# Patient Record
Sex: Female | Born: 1940
Health system: Southern US, Community
[De-identification: ages and names within clinical notes are randomized; demographics above are authoritative.]

## PROBLEM LIST (undated history)

## (undated) DIAGNOSIS — F329 Major depressive disorder, single episode, unspecified: Secondary | ICD-10-CM

## (undated) DIAGNOSIS — M545 Low back pain, unspecified: Secondary | ICD-10-CM

## (undated) DIAGNOSIS — F32A Depression, unspecified: Secondary | ICD-10-CM

## (undated) DIAGNOSIS — I219 Acute myocardial infarction, unspecified: Secondary | ICD-10-CM

## (undated) DIAGNOSIS — G5600 Carpal tunnel syndrome, unspecified upper limb: Secondary | ICD-10-CM

## (undated) DIAGNOSIS — E039 Hypothyroidism, unspecified: Secondary | ICD-10-CM

## (undated) DIAGNOSIS — I10 Essential (primary) hypertension: Secondary | ICD-10-CM

## (undated) DIAGNOSIS — F0390 Unspecified dementia without behavioral disturbance: Secondary | ICD-10-CM

## (undated) DIAGNOSIS — I209 Angina pectoris, unspecified: Secondary | ICD-10-CM

## (undated) DIAGNOSIS — Z789 Other specified health status: Secondary | ICD-10-CM

## (undated) DIAGNOSIS — C50912 Malignant neoplasm of unspecified site of left female breast: Secondary | ICD-10-CM

## (undated) DIAGNOSIS — T8859XA Other complications of anesthesia, initial encounter: Secondary | ICD-10-CM

## (undated) DIAGNOSIS — R112 Nausea with vomiting, unspecified: Secondary | ICD-10-CM

## (undated) DIAGNOSIS — M810 Age-related osteoporosis without current pathological fracture: Secondary | ICD-10-CM

## (undated) DIAGNOSIS — Z531 Procedure and treatment not carried out because of patient's decision for reasons of belief and group pressure: Secondary | ICD-10-CM

## (undated) DIAGNOSIS — M419 Scoliosis, unspecified: Secondary | ICD-10-CM

## (undated) DIAGNOSIS — IMO0001 Reserved for inherently not codable concepts without codable children: Secondary | ICD-10-CM

## (undated) DIAGNOSIS — Z9889 Other specified postprocedural states: Secondary | ICD-10-CM

## (undated) DIAGNOSIS — G43909 Migraine, unspecified, not intractable, without status migrainosus: Secondary | ICD-10-CM

## (undated) DIAGNOSIS — K5790 Diverticulosis of intestine, part unspecified, without perforation or abscess without bleeding: Secondary | ICD-10-CM

## (undated) DIAGNOSIS — M1990 Unspecified osteoarthritis, unspecified site: Secondary | ICD-10-CM

## (undated) DIAGNOSIS — G8929 Other chronic pain: Secondary | ICD-10-CM

## (undated) DIAGNOSIS — R410 Disorientation, unspecified: Secondary | ICD-10-CM

## (undated) DIAGNOSIS — K219 Gastro-esophageal reflux disease without esophagitis: Secondary | ICD-10-CM

## (undated) DIAGNOSIS — C439 Malignant melanoma of skin, unspecified: Secondary | ICD-10-CM

## (undated) DIAGNOSIS — T4145XA Adverse effect of unspecified anesthetic, initial encounter: Secondary | ICD-10-CM

## (undated) DIAGNOSIS — K589 Irritable bowel syndrome without diarrhea: Secondary | ICD-10-CM

## (undated) HISTORY — PX: BREAST SURGERY: SHX581

## (undated) HISTORY — PX: DOPPLER ECHOCARDIOGRAPHY: SHX263

## (undated) HISTORY — PX: CARPAL TUNNEL RELEASE: SHX101

## (undated) HISTORY — DX: Migraine, unspecified, not intractable, without status migrainosus: G43.909

## (undated) HISTORY — PX: MELANOMA EXCISION: SHX5266

## (undated) HISTORY — DX: Carpal tunnel syndrome, unspecified upper limb: G56.00

## (undated) HISTORY — PX: BREAST LUMPECTOMY: SHX2

## (undated) HISTORY — DX: Major depressive disorder, single episode, unspecified: F32.9

## (undated) HISTORY — PX: BREAST BIOPSY: SHX20

## (undated) HISTORY — DX: Age-related osteoporosis without current pathological fracture: M81.0

## (undated) HISTORY — PX: SKIN GRAFT: SHX250

## (undated) HISTORY — PX: CATARACT EXTRACTION W/ INTRAOCULAR LENS  IMPLANT, BILATERAL: SHX1307

## (undated) HISTORY — PX: CARDIAC CATHETERIZATION: SHX172

## (undated) HISTORY — DX: Unspecified dementia, unspecified severity, without behavioral disturbance, psychotic disturbance, mood disturbance, and anxiety: F03.90

## (undated) HISTORY — DX: Essential (primary) hypertension: I10

## (undated) HISTORY — DX: Depression, unspecified: F32.A

## (undated) HISTORY — DX: Gastro-esophageal reflux disease without esophagitis: K21.9

## (undated) HISTORY — PX: KELOID EXCISION: SHX1856

## (undated) HISTORY — PX: CARDIOVASCULAR STRESS TEST: SHX262

## (undated) HISTORY — PX: JOINT REPLACEMENT: SHX530

## (undated) HISTORY — DX: Disorientation, unspecified: R41.0

## (undated) HISTORY — PX: TUBAL LIGATION: SHX77

## (undated) HISTORY — PX: DILATION AND CURETTAGE OF UTERUS: SHX78

---

## 1993-11-20 HISTORY — PX: CARPAL TUNNEL RELEASE: SHX101

## 1998-05-31 ENCOUNTER — Ambulatory Visit (HOSPITAL_COMMUNITY): Admission: RE | Admit: 1998-05-31 | Discharge: 1998-05-31 | Payer: Self-pay | Admitting: Internal Medicine

## 1998-06-03 ENCOUNTER — Ambulatory Visit: Admission: RE | Admit: 1998-06-03 | Discharge: 1998-06-03 | Payer: Self-pay | Admitting: Internal Medicine

## 1998-06-17 ENCOUNTER — Emergency Department (HOSPITAL_COMMUNITY): Admission: EM | Admit: 1998-06-17 | Discharge: 1998-06-17 | Payer: Self-pay

## 1998-06-22 ENCOUNTER — Inpatient Hospital Stay (HOSPITAL_COMMUNITY): Admission: EM | Admit: 1998-06-22 | Discharge: 1998-06-24 | Payer: Self-pay | Admitting: *Deleted

## 1998-10-19 ENCOUNTER — Encounter: Payer: Self-pay | Admitting: Internal Medicine

## 1998-10-19 ENCOUNTER — Ambulatory Visit (HOSPITAL_COMMUNITY): Admission: RE | Admit: 1998-10-19 | Discharge: 1998-10-19 | Payer: Self-pay | Admitting: Internal Medicine

## 1999-08-16 ENCOUNTER — Other Ambulatory Visit: Admission: RE | Admit: 1999-08-16 | Discharge: 1999-08-16 | Payer: Self-pay | Admitting: Internal Medicine

## 1999-12-08 ENCOUNTER — Encounter: Payer: Self-pay | Admitting: Internal Medicine

## 1999-12-09 ENCOUNTER — Ambulatory Visit (HOSPITAL_COMMUNITY): Admission: RE | Admit: 1999-12-09 | Discharge: 1999-12-09 | Payer: Self-pay | Admitting: Internal Medicine

## 2000-12-04 ENCOUNTER — Other Ambulatory Visit: Admission: RE | Admit: 2000-12-04 | Discharge: 2000-12-04 | Payer: Self-pay | Admitting: Internal Medicine

## 2000-12-25 ENCOUNTER — Encounter: Payer: Self-pay | Admitting: Internal Medicine

## 2000-12-25 ENCOUNTER — Ambulatory Visit (HOSPITAL_COMMUNITY): Admission: RE | Admit: 2000-12-25 | Discharge: 2000-12-25 | Payer: Self-pay | Admitting: Internal Medicine

## 2001-03-21 ENCOUNTER — Encounter: Admission: RE | Admit: 2001-03-21 | Discharge: 2001-03-21 | Payer: Self-pay | Admitting: Internal Medicine

## 2001-03-21 ENCOUNTER — Encounter: Payer: Self-pay | Admitting: Internal Medicine

## 2001-04-02 ENCOUNTER — Emergency Department (HOSPITAL_COMMUNITY): Admission: EM | Admit: 2001-04-02 | Discharge: 2001-04-03 | Payer: Self-pay | Admitting: *Deleted

## 2001-06-24 ENCOUNTER — Ambulatory Visit (HOSPITAL_BASED_OUTPATIENT_CLINIC_OR_DEPARTMENT_OTHER): Admission: RE | Admit: 2001-06-24 | Discharge: 2001-06-24 | Payer: Self-pay | Admitting: General Surgery

## 2002-02-13 ENCOUNTER — Encounter: Payer: Self-pay | Admitting: Internal Medicine

## 2002-02-13 ENCOUNTER — Ambulatory Visit (HOSPITAL_COMMUNITY): Admission: RE | Admit: 2002-02-13 | Discharge: 2002-02-13 | Payer: Self-pay | Admitting: Internal Medicine

## 2002-11-11 ENCOUNTER — Ambulatory Visit (HOSPITAL_COMMUNITY): Admission: RE | Admit: 2002-11-11 | Discharge: 2002-11-11 | Payer: Self-pay | Admitting: Gastroenterology

## 2002-11-11 ENCOUNTER — Encounter (INDEPENDENT_AMBULATORY_CARE_PROVIDER_SITE_OTHER): Payer: Self-pay | Admitting: Specialist

## 2003-03-11 ENCOUNTER — Ambulatory Visit (HOSPITAL_COMMUNITY): Admission: RE | Admit: 2003-03-11 | Discharge: 2003-03-11 | Payer: Self-pay | Admitting: Internal Medicine

## 2003-03-11 ENCOUNTER — Encounter: Payer: Self-pay | Admitting: Internal Medicine

## 2003-08-11 ENCOUNTER — Encounter: Payer: Self-pay | Admitting: Internal Medicine

## 2003-08-11 ENCOUNTER — Encounter: Admission: RE | Admit: 2003-08-11 | Discharge: 2003-08-11 | Payer: Self-pay | Admitting: Internal Medicine

## 2003-12-21 ENCOUNTER — Ambulatory Visit (HOSPITAL_COMMUNITY): Admission: RE | Admit: 2003-12-21 | Discharge: 2003-12-21 | Payer: Self-pay | Admitting: Internal Medicine

## 2004-07-26 ENCOUNTER — Ambulatory Visit (HOSPITAL_COMMUNITY): Admission: RE | Admit: 2004-07-26 | Discharge: 2004-07-26 | Payer: Self-pay | Admitting: Internal Medicine

## 2004-08-16 ENCOUNTER — Other Ambulatory Visit: Admission: RE | Admit: 2004-08-16 | Discharge: 2004-08-16 | Payer: Self-pay | Admitting: Internal Medicine

## 2004-11-24 ENCOUNTER — Emergency Department (HOSPITAL_COMMUNITY): Admission: EM | Admit: 2004-11-24 | Discharge: 2004-11-24 | Payer: Self-pay | Admitting: Family Medicine

## 2005-08-04 ENCOUNTER — Emergency Department (HOSPITAL_COMMUNITY): Admission: EM | Admit: 2005-08-04 | Discharge: 2005-08-04 | Payer: Self-pay | Admitting: Family Medicine

## 2005-08-18 ENCOUNTER — Encounter: Admission: RE | Admit: 2005-08-18 | Discharge: 2005-08-18 | Payer: Self-pay | Admitting: Occupational Medicine

## 2005-09-07 ENCOUNTER — Encounter: Admission: RE | Admit: 2005-09-07 | Discharge: 2005-10-19 | Payer: Self-pay | Admitting: Occupational Medicine

## 2005-10-10 ENCOUNTER — Ambulatory Visit (HOSPITAL_COMMUNITY): Admission: RE | Admit: 2005-10-10 | Discharge: 2005-10-10 | Payer: Self-pay | Admitting: Internal Medicine

## 2006-01-11 ENCOUNTER — Emergency Department (HOSPITAL_COMMUNITY): Admission: EM | Admit: 2006-01-11 | Discharge: 2006-01-11 | Payer: Self-pay | Admitting: Emergency Medicine

## 2006-04-08 ENCOUNTER — Encounter: Admission: RE | Admit: 2006-04-08 | Discharge: 2006-04-08 | Payer: Self-pay | Admitting: Internal Medicine

## 2006-06-05 ENCOUNTER — Encounter: Admission: RE | Admit: 2006-06-05 | Discharge: 2006-06-05 | Payer: Self-pay | Admitting: Specialist

## 2006-09-02 ENCOUNTER — Emergency Department (HOSPITAL_COMMUNITY): Admission: AD | Admit: 2006-09-02 | Discharge: 2006-09-02 | Payer: Self-pay | Admitting: Family Medicine

## 2006-09-02 ENCOUNTER — Ambulatory Visit (HOSPITAL_COMMUNITY): Admission: EM | Admit: 2006-09-02 | Discharge: 2006-09-02 | Payer: Self-pay | Admitting: Family Medicine

## 2006-11-15 ENCOUNTER — Ambulatory Visit (HOSPITAL_COMMUNITY): Admission: RE | Admit: 2006-11-15 | Discharge: 2006-11-15 | Payer: Self-pay | Admitting: Internal Medicine

## 2007-06-07 ENCOUNTER — Emergency Department (HOSPITAL_COMMUNITY): Admission: EM | Admit: 2007-06-07 | Discharge: 2007-06-07 | Payer: Self-pay | Admitting: Emergency Medicine

## 2007-06-11 ENCOUNTER — Encounter: Admission: RE | Admit: 2007-06-11 | Discharge: 2007-06-11 | Payer: Self-pay

## 2007-06-17 ENCOUNTER — Encounter: Admission: RE | Admit: 2007-06-17 | Discharge: 2007-06-17 | Payer: Self-pay | Admitting: Orthopedic Surgery

## 2007-06-21 HISTORY — PX: KNEE ARTHROSCOPY: SHX127

## 2007-06-26 ENCOUNTER — Ambulatory Visit: Payer: Self-pay | Admitting: Vascular Surgery

## 2007-06-26 ENCOUNTER — Ambulatory Visit (HOSPITAL_COMMUNITY): Admission: RE | Admit: 2007-06-26 | Discharge: 2007-06-26 | Payer: Self-pay | Admitting: Orthopedic Surgery

## 2007-06-26 ENCOUNTER — Encounter (INDEPENDENT_AMBULATORY_CARE_PROVIDER_SITE_OTHER): Payer: Self-pay | Admitting: Orthopedic Surgery

## 2007-07-11 ENCOUNTER — Ambulatory Visit (HOSPITAL_BASED_OUTPATIENT_CLINIC_OR_DEPARTMENT_OTHER): Admission: RE | Admit: 2007-07-11 | Discharge: 2007-07-11 | Payer: Self-pay | Admitting: Orthopedic Surgery

## 2007-10-21 HISTORY — PX: ANKLE SURGERY: SHX546

## 2007-11-05 ENCOUNTER — Other Ambulatory Visit: Payer: Self-pay | Admitting: Orthopedic Surgery

## 2007-11-12 ENCOUNTER — Ambulatory Visit (HOSPITAL_BASED_OUTPATIENT_CLINIC_OR_DEPARTMENT_OTHER): Admission: RE | Admit: 2007-11-12 | Discharge: 2007-11-12 | Payer: Self-pay | Admitting: Orthopedic Surgery

## 2008-03-16 ENCOUNTER — Ambulatory Visit (HOSPITAL_COMMUNITY): Admission: RE | Admit: 2008-03-16 | Discharge: 2008-03-16 | Payer: Self-pay | Admitting: Internal Medicine

## 2008-08-20 HISTORY — PX: TOTAL KNEE ARTHROPLASTY: SHX125

## 2008-08-25 ENCOUNTER — Inpatient Hospital Stay (HOSPITAL_COMMUNITY): Admission: RE | Admit: 2008-08-25 | Discharge: 2008-08-29 | Payer: Self-pay | Admitting: Orthopedic Surgery

## 2008-09-15 ENCOUNTER — Other Ambulatory Visit: Admission: RE | Admit: 2008-09-15 | Discharge: 2008-09-15 | Payer: Self-pay | Admitting: Internal Medicine

## 2008-11-04 ENCOUNTER — Encounter: Admission: RE | Admit: 2008-11-04 | Discharge: 2008-11-04 | Payer: Self-pay | Admitting: Internal Medicine

## 2009-02-01 ENCOUNTER — Emergency Department (HOSPITAL_COMMUNITY): Admission: EM | Admit: 2009-02-01 | Discharge: 2009-02-01 | Payer: Self-pay | Admitting: Emergency Medicine

## 2009-10-29 ENCOUNTER — Ambulatory Visit (HOSPITAL_COMMUNITY): Admission: RE | Admit: 2009-10-29 | Discharge: 2009-10-29 | Payer: Self-pay | Admitting: Internal Medicine

## 2010-04-12 ENCOUNTER — Emergency Department (HOSPITAL_COMMUNITY): Admission: EM | Admit: 2010-04-12 | Discharge: 2010-04-12 | Payer: Self-pay | Admitting: Emergency Medicine

## 2010-07-30 ENCOUNTER — Emergency Department (HOSPITAL_COMMUNITY)
Admission: EM | Admit: 2010-07-30 | Discharge: 2010-07-30 | Payer: Self-pay | Source: Home / Self Care | Admitting: Emergency Medicine

## 2010-11-17 ENCOUNTER — Ambulatory Visit (HOSPITAL_COMMUNITY)
Admission: RE | Admit: 2010-11-17 | Discharge: 2010-11-17 | Payer: Self-pay | Source: Home / Self Care | Attending: Internal Medicine | Admitting: Internal Medicine

## 2010-12-11 ENCOUNTER — Encounter: Payer: Self-pay | Admitting: Internal Medicine

## 2010-12-20 ENCOUNTER — Other Ambulatory Visit: Payer: Self-pay | Admitting: Dermatology

## 2011-04-04 NOTE — Op Note (Signed)
NAMEASMAA, TIRPAK                ACCOUNT NO.:  [id number]   MEDICAL RECORD NO.:  [id number]          PATIENT TYPE:  INP   LOCATION:  1605                         FACILITY:  [facility]   PHYSICIAN:  Deidre Ala, M.D.    DATE OF BIRTH:  [birth year]   DATE OF PROCEDURE:  08/25/2008  DATE OF DISCHARGE:                               OPERATIVE REPORT   PREOPERATIVE DIAGNOSIS:  End-stage degenerative joint disease right  knee.   POSTOPERATIVE DIAGNOSIS:  End-stage degenerative joint disease right  knee.   PROCEDURE:  Right total knee arthroplasty using cemented DePuy  components, LCS type with rotating platform with MBT type revision stem.   SURGEON:  1. Charlesetta Shanks, M.D.   ASSISTANT:  Phineas Semen, PA-C.   ANESTHESIA:  Spinal.   CULTURES:  None.   DRAINS:  Two medium Hemovacs to self suction.   ESTIMATED BLOOD LOSS:  Less than 100 mL.   REPLACED:  Without.   TOURNIQUET TIME:  68 minutes.   PATHOLOGIC FINDINGS AND HISTORY:  Ashley Hanson has had right knee pain.  She had a right knee arthroscopy which showed significant degenerative  joint disease.  She failed other conservative measures with persistent  pain.  Supartz did not resolve her symptoms, and so elected to proceed  with total knee arthroplasty.  At surgery, she had marked  tricompartmental disease, bone-on-bone.  We fitted her to a standard  plus right femur, a size standard rotating platform 12.5 mm, a #4 tibia,  a stem 75 x 14 and a 35 mm all-poly patellar button.  We used tobramycin  in the cement, two batches with two doses of tobramycin.  We had full  extension with flexion to greater than 105 degrees.  She had good  ligamentous stability with matched flexion-extension gap at 12.5.  We  did use gentamicin at the end of the case into the wound, injected over  the implants with Hemovac drains to self suction.   DESCRIPTION OF PROCEDURE:  With adequate anesthesia obtained using  spinal technique, 1 gram of Ancef  given IV prophylaxis and another one  at tourniquet letdown, the patient was placed in the supine position.  The right lower extremity was prepped from the toes to the tourniquet in  the standard fashion.  After standard prepping and draping, Esmarch  exsanguination was used.  The tourniquet was let up to 350 mmHg.  Median  parapatellar skin incisions followed by a median parapatellar  retinacular incision.  The incision was deepened sharply with a knife  and hemostasis obtained using the Bovie electrocoagulator.  The  retinaculum was incised, the patella everted.  Fat pad, both menisci and  the cruciates then removed.  I then amputated the tibial spine with a  saw.  I used the intermedullary reaming device and ultimately broached  up to a 14.  Intramedullary guide was put in placed on the tibia, the  cutting block put in place and the tibial cut made.  Alignment was  checked and was found to be satisfactory.  I then sized the femur to a  standard plus  and placed intermedullary guide down the canal.  A C-clamp  was used set on 20.  I then pinned it, then moved it down to 2.5 mm more  posterior.  I then set the C-clamp on a 17.5, made the anterior-  posterior cuts and then fit the 12.5 lollipop in flexion.  A 4 degree  distal valgus femoral cutting jig was then put in place.  The first cut  made and fit to 12.5 in extension.  Finishing guide was placed on the  femur and those cuts were made, as well as the notch cut.  I then sized  the tibia to a 4, centered it.  I pinned it and drilled a proximal  broach and drilled down for the stem.  I then trialed the tibia as  above, placed a 12.5 rotating platform and placed the femur on with good  articulation noted.  I then used the cutting guide for the patella,  cutting it down about 10 mm and placed the 3-peg hole guide, made those  holes and trialed the patella.  All trial components were then removed  while we checked component sizing as they  came on the field.  I then  articulated the knee through a range of motion as we said, removed all  the trial components as the new components came on the field for  implantation and checked for sizing.  Thorough jet lavage was carried  about the knee.  The cement was mixed in the cement gun with  antibiotics.  I then cemented after assembling the tibial component the  tibial tray and stem and impacted it, removed excess cement.  I then put  the 12.5 rotating platform.  I then cemented on the femoral component,  impacted it and removed excess cement.  I then held the knee in full  extension, impacted it, removed excess cement and cemented on the  patella component, impacted it and held it with a clamp until the cement  had cured.  When the cement had cured, the tourniquet was let down and  bleeding points cauterized.  Hemovac plans were drains were placed in  the medial and lateral gutter and brought up the superolateral portal.  The wound was then closed in layers with #1 Vicryl figure-of-eight  interrupted on the retinaculum, running locking oversew of #1 PDS, 0 and  2-0 Vicryl on the subcu and skin staples.  Hemovac was charged with 20  mg of gentamicin in 40 mL of saline and the rubber shod clamp used on  the Hemovac tubes, connected to the Hemovac, which will be opened up at  2 hours in the PACU.  A bulky sterile compressive dressing was applied  with Ace and knee immobilizer.  The patient then having tolerated the  procedure well was awakened and taken to the recovery room in  satisfactory condition to be admitted for routine postoperative care and  CPM.           ______________________________  V. Charlesetta Shanks, M.D.     VEP/MEDQ  D:  08/25/2008  T:  08/25/2008  Job:  [id number]

## 2011-04-04 NOTE — Op Note (Signed)
Ashley Hanson, Ashley Hanson                ACCOUNT NO.:  [id number]   MEDICAL RECORD NO.:  [id number]          PATIENT TYPE:  AMB   LOCATION:  DSC                          FACILITY:  [facility]   PHYSICIAN:  Deidre Ala, M.D.    DATE OF BIRTH:  [birth year]   DATE OF PROCEDURE:  11/12/2007  DATE OF DISCHARGE:                               OPERATIVE REPORT   PREOPERATIVE DIAGNOSES:  Impingement syndrome, left ankle with chronic  synovitis.  Rule out instability with chronic ankle sprain ATF.   POSTOPERATIVE DIAGNOSES:  1. Impingement syndrome right ankle, with synovitis anterior and      posterior.  2. No evidence of significant instability.   PROCEDURES:  1. Left ankle operative arthroscopy, with extensive debridement and      synovectomy; and debridement of the anterior distal tibia bony lip.  2. Exam under anesthesia with fluoroscopy.   SURGEON:  1. Charlesetta Shanks, M.D.   ASSISTANT:  Phineas Semen, P.A.   ANESTHESIA:  General with LMA.   CULTURES:  None.   DRAINS:  None.   ESTIMATED BLOOD LOSS:  Minimal.   TOURNIQUET TIME:  50 minutes.   PATHOLOGIC FINDINGS AND HISTORY:  Ashley Hanson has had a longstanding history  of left ankle pain and was worker's comp with this.  She has had an  extensive workup with MRIs.  Seeing Dr. Lestine Box, Dr. Lajoyce Corners and myself.  The ankle was continually becoming inflamed, painful, tender over the  anterior talofibular ligament, with some entertainment of possible ankle  instability noted.  Intermittent swelling.  Venous stasis changes and  persistent synovitis.  We have exhausted conservative analysis and  conservative treatment, and there is some question about whether or not  she is actually looser than showing up on stress views.  The problem is  doing a major reconstruction operation with her ankle swelling and skin  is worrisome.  In any case, Dr. Lajoyce Corners has recently said that there may be  unrecognized OA in the joint, th persistent synovitis.  Dr. Lajoyce Corners felt  that her exam was consistent with impingement, and suggested going ahead  with the scope.  The patient was desirous of proceeding, and at surgery  we did an inversion stress testing; there was indeed no significant  opening under anesthesia.  At surgery her articular surface looked quite  good.  She had a bit of impingement impaction in the anterior talar dome  and neck, but there was not significant ankle arthritis and she was not  opening to stressing under direct vision.  There was synovitis  posteriorly.  Anteriorly there was a large amount of synovitis and  synovitis in the medial and lateral gutters as well.  There was an  anterior distal tibial lip that appeared to be impinging in  dorsiflexion.  The anterior distal lip was debrided to a beveled edge,  as one would do an acromion in the shoulder.  All the synovitis was  removed from the medial lateral gutters and anteriorly to the talar  neck, as well as posteriorly.  Everything was clear at closure.   PROCEDURE:  With adequate anesthesia obtained using LMA technique, 1  gram Ancef given IV prophylaxis and the patient did have a regional  block.  The patient was placed in the supine position.  The left lower  extremity was prepped from the fingertips to the leg holder in the  standard fashion.  After standard prepping and draping, Esmarch  examination was used.  The tourniquet was let up to 350 mmHg.  We then  injected the joint through an anterior portal with an 18-gauge spinal  needle with 10 mL of saline to break the vacuum.  We then placed a  distractor mechanism around the ankle.  I then made an anterolateral  portal at the anterior mortise.  I made an incision spread with a  mosquito hemostat, to move any neural structures away; and then entered  the ankle from the anterolateral side with a scope and visualized the  joint.  There was a cloud of synovitis anteriorly.  A portal was made  anteromedially, a shaver brought in, and  all this was shaved out  including the medial gutter.  I then started with a small bur and  doubled the anterior distal tibia; and cleared it so it would not pinch  on dorsiflexion.  I cleared all the synovitis to the talar neck.  Portals were reversed and similar shavings carried out.  When I was  happy with the debridement and the bleeding points were cauterized, the  ankle was irrigated through the scope.  Marcaine 0.5% was not added due  to the block.  The portals were closed with subcuticular 4-0 Vicryl, and  then vertical mattress sutures of 4-0 nylon.  A bulky sterile  compressive dressing was applied with Ace.  The patient, then having  tolerated the procedure, was awakened, taken to recovery room in  satisfactory condition.  To be discharged per outpatient routine; given  Percocet for pain, crutches, weightbearing as tolerated.  Ice about the  ankle and told to call the office for appointment for recheck on Monday.   LABORATORY DATA:  Within normal limits .           ______________________________  V. Charlesetta Shanks, M.D.     VEP/MEDQ  D:  11/12/2007  T:  11/12/2007  Job:  [id number]

## 2011-04-07 NOTE — Op Note (Signed)
   NAME:  Ashley Hanson, Ashley Hanson                          ACCOUNT NO.:  [id number]   MEDICAL RECORD NO.:  [id number]                   PATIENT TYPE:  AMB   LOCATION:  ENDO                                 FACILITY:  [facility]   PHYSICIAN:  John C. Madilyn Fireman, M.D.                 DATE OF BIRTH:  14-May-1941   DATE OF PROCEDURE:  11/11/2002  DATE OF DISCHARGE:                                 OPERATIVE REPORT   PROCEDURE:  Colonoscopy.   INDICATION FOR PROCEDURE:  Family history of colon cancer.  This has also  done as part of a workup for diarrhea and weight loss.   DESCRIPTION OF PROCEDURE:  The patient was placed in the left lateral  decubitus position and placed on the pulse monitor with continuous low-flow  oxygen delivered by nasal cannula.  She was sedated with 25 mcg IV fentanyl  and 2 mg IV Versed in addition to the medicine given for the previous EGD.  The Olympus video colonoscope was inserted into the rectum and advanced to  the cecum, confirmed by transillumination of McBurney's point and  visualization of the ileocecal valve and appendiceal orifice.  The prep was  excellent.  In the base of the cecum there was seen an 8 mm round polyp,  which was removed by snare.  The remainder of the cecum, ascending,  transverse, descending, sigmoid, and rectum all appeared normal with no  masses, polyps, diverticula, or other mucosal abnormalities.  The rectum  likewise appeared normal, and retroflexed view of the anus revealed no  obvious internal hemorrhoids.  Biopsies were taken to rule out collagenous  colitis.  The scope was then withdrawn and the patient returned to the  recovery room in stable condition.  She tolerated the procedure well, and  there were no immediate complications.   IMPRESSION:  Cecal polyp, otherwise normal study.   PLAN:  Await all biopsy results.                                               John C. Madilyn Fireman, M.D.    JCH/MEDQ  D:  11/11/2002  T:  11/12/2002  Job:   [id number]   cc:   Erskine Speed, M.D.  [address], [address]  [place]  [place] [address]  Fax: [phone]

## 2011-04-07 NOTE — Op Note (Signed)
   NAME:  Ashley Hanson, Ashley Hanson                          ACCOUNT NO.:  [id number]   MEDICAL RECORD NO.:  [id number]                   PATIENT TYPE:  AMB   LOCATION:  ENDO                                 FACILITY:  [facility]   PHYSICIAN:  John C. Madilyn Fireman, M.D.                 DATE OF BIRTH:  Jan 11, 1941   DATE OF PROCEDURE:  DATE OF DISCHARGE:                                 OPERATIVE REPORT   PROCEDURE:  Esophagogastroduodenoscopy.   INDICATIONS FOR PROCEDURE:  Diarrhea, anorexia, weight loss and family  history of colon cancer. She is also undergoing colonoscopy today.   DESCRIPTION OF PROCEDURE:  The patient was placed in the left lateral  decubitus position and placed on the pulse monitor with continuous low flow  oxygen delivered by nasal cannula. She was sedated with 50 mcg of IV  fentanyl and 4 mg of IV Versed.   The Olympus video endoscope was advanced under direct visualization into the  oropharynx and esophagus. The esophagus was straight and of normal caliber  with the squamocolumnar line at 38 cm. There was no significant hiatal  hernia, ring or stricture that I could note at the GE junction.   The stomach was entered and a small amount of liquid secretions were  suctioned from the fundus. A retroflex view of the cardia was unremarkable.  The fundus, body, antrum and pylorus all appeared normal. The duodenum was  entered and both the bulb and the second portion were all inspected and  appeared to be within normal limits. The scope was advanced as far as  possible down the distal duodenum and biopsies were taken to rule out celiac  disease.   The scope was then withdrawn.  The patient returned to the recovery room in  stable condition. She tolerated the procedure well and there were no  immediate complications.   IMPRESSION:  Normal endoscopy.   PLAN:  Await biopsies and will proceed to the colonoscopy and further  evaluation of her diarrhea.                John C. Madilyn Fireman, M.D.    JCH/MEDQ  D:  11/11/2002  T:  11/12/2002  Job:  [id number]   cc:   Erskine Speed, M.D.  [address] [place]  [place]  [place] [address]  Fax: [phone]

## 2011-04-07 NOTE — Discharge Summary (Signed)
NAMEAUSTINA, Ashley Hanson                ACCOUNT NO.:  [id number]   MEDICAL RECORD NO.:  [id number]          PATIENT TYPE:  INP   LOCATION:  1605                         [facility]ACILITY:  [facility]   PHYSICIAN:  Deidre Ala, M.D.    DATE OF BIRTH:  1941/05/04   DATE OF ADMISSION:  08/25/2008  DATE OF DISCHARGE:  08/29/2008                               DISCHARGE SUMMARY   FINAL DIAGNOSES:  1. Degenerative disk disease of right knee.  2. Hypertension.  3. Anxiety.  4. Gastroesophageal reflux disease.  5. Postoperative blood loss anemia.  6. Hypokalemia.  7. History of breast cancer.   PROCEDURES:  On August 25, 2008:  Right total knee arthroplasty.   SURGEON:  1. Charlesetta Shanks, M.D.   HISTORY:  This is a 70 year old African American female followed by Dr.  Renae Fickle for a number of years.  We have done previous surgeries on her.  She has had a complaint of right knee pain which failed medical  management.  She was ready for a total knee arthroplasty.  She  subsequently was scheduled.   HOSPITAL COURSE:  On August 25, 2008, she was admitted through [facility] where she underwent a total knee arthroplasty.  The  patient tolerated the procedure well.  No intraoperative complication  occurred.  Postoperatively for the most part, the patient did well  during her stay.  She did suffer some hypokalemia which resolved with  p.o. potassium during her stay.  She had postoperative blood loss anemia  which did not require blood products.  She did well throughout her stay  otherwise.  Worked with physical therapy and was up moving about by the  third postoperative day.  On her fourth postoperative day, she was ready  for discharge.  She had been on Lovenox and will continue this on this  as an outpatient for another 10 days.   DISCHARGE MEDICATIONS:  1. Neurontin 600 mg b.i.d.  2. Nexium 40 mg daily.  3. Propanol ER 120 mg daily.  4. Clorazepate 3.75 mg t.i.d.  5. Diltiazem 180 mg daily.  6.  Indapamide 2.5 mg daily.  7. Percocet 5/325 one-two p.o. q.4-6 h. p.r.n. for pain.  8. Skelaxin 500 mg 1 p.o. q.8 h. p.r.n. for muscle spasms.  9. Lovenox 30 mg subcutaneous q.12 h. for the next 10 days.   DISPOSITION:  The patient was subsequently discharged in satisfactory  and stable condition on August 29, 2008.   FOLLOW UP:  Follow up with Dr. Renae Fickle within 10 days.      Phineas Semen, Wynonia Hazard, M.D.  Electronically Signed    CL/MEDQ  D:  09/22/2008  T:  09/22/2008  Job:  [id number]

## 2011-04-07 NOTE — Op Note (Signed)
Ashley Hanson, Ashley Hanson                ACCOUNT NO.:  [id number]   MEDICAL RECORD NO.:  [id number]          PATIENT TYPE:  AMB   LOCATION:  NESC                         FACILITY:  [facility]   PHYSICIAN:  Deidre Ala, M.D.    DATE OF BIRTH:  [birth year]   DATE OF PROCEDURE:  07/12/2007  DATE OF DISCHARGE:                               OPERATIVE REPORT   PREOPERATIVE DIAGNOSIS:  1. Horizontal tear posterior horn lateral meniscus.  2. Degenerative change about the knee worse in the patellofemoral      compartment.  3. Baker's cyst with some debris.  4. Mild body edema in the tibial tuberosity, possible component of      marked patellofemoral degenerative disease.   POSTOPERATIVE DIAGNOSIS:  1. Degenerative lateral meniscus tear.  2. Posterior medial meniscus tear.  3. Grade 3 OA entire trochlea.  4. Grade 3 OA medial tibial plateau, lateral tibial plateau, medial      femoral condyle.  5. Tight lateral retinaculum.  6. Medial and lateral plicas  7. Pouch synovitis.   PROCEDURE:  1. Right knee operative arthroscopy with partial medial-and-lateral      meniscectomies.  2. Abrasion-ablation chondroplasty, trochlea, medial femoral condyle,      medial tibial plateau and lateral tibial plateau, and lateral      femoral condyle.  3. Lateral retinacular release.  4. Medial-and-lateral plica excision.   SURGEON:  1. Charlesetta Shanks, M.D.   ASSISTANT:  Phineas Semen, P.A.   ANESTHESIA:  General with LMA.   CULTURES:  None.   DRAINS:  None.   ESTIMATED BLOOD LOSS:  Minimal.   TOURNIQUET TIME:  42 minutes.   PATHOLOGIC FINDINGS AND HISTORY:  [name] is a 70 year old female who has  had bilateral hip pain and ankle pain, but also knee pain.  We saw her  ON January 18, 2007; and felt that she had some right knee degenerative  joint disease medial side and injected her knee with cortisone.  She  also had injections in her trochanteric bursa.  She has had chronic long-  term ankle problems  with some venous stasis especially on the left side.  She continued to have discomfort on her knee and bilateral leg pain.  We  got ABIs done and ultrasound which were all negative.  Ultimately we got  an MRI of her knee showing a posterior horn, medial meniscus tear, and  degeneration in the patellofemoral joint.  She was continuing to have  pain in her knee; and she elected to proceed with diagnostic and  operative arthroscopy.  She had failed her previous cortisone injection  with only temporary relief.  At surgery we found basically the entire  trochlea to be involved with grade 3 changes and this was the area that,  I think, she was most symptomatic.  This was smoothed with the ablator  all the way down to the notch.  She had a very tight lateral retinaculum  with degenerative change on the lateral patella.  She was not broken  down more medially, but she was extremely tight; and we did a lateral  retinacular release because of this significant patellofemoral disease.  I think this was probably her major area of symptomatology.   She had a degenerative posterior horn medial meniscus tear and a  degenerative mid segment lateral meniscus tear to mid substance of  lateral meniscus that was ragged and somewhat unstable.  Both menisci  were trimmed back to stable rims leaving as much meniscus as possible.  She had a lateral retinaculum so tight that we could hardly get the  scope underneath it.  We did that release; we shaved out medial lateral  plicas; smoothed all the chondromalacia; and grade 3 osteoarthritic  surfaces.  She had improved tilt and track and less pressurization at  closure.   DESCRIPTION OF PROCEDURE:  With adequate anesthesia obtained using LMA  technique, 1 gram Ancef given IV prophylaxis.  The patient was placed in  the supine position; and the right lower extremity was prepped from the  malleoli to the leg holder in the standard fashion.  After standard  prepping  and draping, Esmarch examination was used.  The tourniquet was  let up to 350 mmHg.  Superior and lateral inflow portal was made.  The  knee was insufflated with normal saline with an arthroscopic pump.  The  medial lateral scope portals were then made; and the joint was  thoroughly inspected.  I then shaved out the medial plica back to the  sidewall and lysed the medial band.  I then shaved the trochlear defect  which was extensive, half dollar size, and then smoothed it with the  ablator on 1 from the top of the trochlea all the way to the notch.  Edges were also smoothed with this to make a good gliding motion of the  patella.   I then checked the posterior medial meniscus.  I did see a small loose  body which we sucked out.  We used basket and shaver to smooth the  posterior horn medial meniscus and used the ablator on 1 to smooth.  I  then reversed portals; and I used a probe to the lateral meniscus; used  basket and shaver to saucerize this tear, and smoothed it with the  ablator on 1 to a stable rim.  I did light ablation on the lateral  tibial plateau and lateral femoral condyle.   I then shaved out the lateral gutter synovitis and pouch synovitis.  I  then smoothed the undersurface of the lateral patella where she was very  tight with a very tight lateral retinaculum, so tight I could not get  the scope underneath.  Lateral retinacular release was carried out from  vastus lateralis to the joint line with the hook ArthroCare.  We then  observed improved tilt and track.  The knee was then irrigated through  the scope with 0.5% Marcaine with morphine injected in about the joint.  The portal was left open.  A bulky sterile compressive dressing was  applied with lateral foam pad for tamponade; and an easy-wrap placed.  The patient then having tolerated procedure well was awakened, taken to  recovery room in satisfactory condition, to be discharged per outpatient  routine.  Given  Percocet for pain; and told to call the office for  recheck tomorrow.   Laboratory data within normal limits.           ______________________________  V. Charlesetta Shanks, M.D.    VEP/MEDQ  D:  07/11/2007  T:  07/12/2007  Job:  [id number]

## 2011-04-07 NOTE — Op Note (Signed)
[place]  Patient:    Ashley Hanson, Ashley Hanson                       MRN: [id number] Proc. Date: 06/24/01 Adm. Date:  [id number] Attending:  Brandy Hale CC:         Erskine Speed, M.D.   Operative Report  PREOPERATIVE DIAGNOSES: 1. A 1.5 cm recurrent keloid left axillary scar. 2. A 5.0 mm cyst right gluteal area. 3. A 6.0 mm diameter pigmented nevus left calf.  POSTOPERATIVE DIAGNOSES: 1. A 1.5 cm recurrent keloid left axillary scar. 2. A 5.0 mm cyst right gluteal area. 3. A 6.0 mm diameter pigmented nevus left calf.  OPERATION: 1. Excision 1.5 cm diameter recurrent keloid left axilla. 2. Excision 5.0 mm cyst right gluteal area. 3. Excision of 6.0 mm nevus left calf.  SURGEON:  Angelia Mould. Derrell Lolling, M.D.  OPERATIVE INDICATIONS:  This is a 70 year old black female with a history of breast cancer.  She had a keloid excised in her left axillary scar several years ago and did very well with that but now the most posterior aspect of this incision has developed recurrent tender keloid about 1.5 cm in diameter. She has a small pigmented cyst of the right gluteal area and a raised pigmented nevus of the left calf which bother her and she also wants these excised.  OPERATIVE TECHNIQUE:  The patient was brought to [facility] to the Minor Surgery Room.  We ultimately prepped and draped the left axilla, the right gluteal area and the left calf in routine sterile fashion.  Xylocaine 1% with epinephrine was used as a local infiltration anesthetic.  The left axillary wound was inspected.  A transverse elliptical incision was made excising the keloid scar which was sent for pathology.  I injected 2.5 cc of Kenalog solution into the dermis throughout the incision.  The skin was closed with five interrupted sutures of 4-0 nylon and sterile bandage was placed.  The right gluteal area was inspected.  Xylocaine 1% with epinephrine was  used as a local infiltration anesthetic. A transverse elliptical incision was made and we excised completely a very darkly pigmented cystic structure.  This was sent for histology.  The skin was closed with a single suture of 4-0 nylon and sterile bandage.  We then exposed the left calf area posteriorly.  Xylocaine 1% with epinephrine was used as a local anesthetic. A transverse elliptical incision was made around this pigmented nevus and great care was taken to get about a 2 mm margin around this.  The specimen was sent to pathology to rule out melanoma. Skin was closed with three interrupted sutures of 4-0 nylon.  A clean bandage was placed.  She tolerated all three of these procedures well.  There were no complications. Hemostasis was excellent. She was returned to the waiting room in excellent condition. DD:  06/24/01 TD:  06/25/01 Job: [id number]

## 2011-06-21 HISTORY — PX: SHOULDER ARTHROSCOPY: SHX128

## 2011-06-30 ENCOUNTER — Ambulatory Visit
Admission: RE | Admit: 2011-06-30 | Discharge: 2011-06-30 | Disposition: A | Discharge status: Home / Self Care | Payer: Medicare Other | Source: Ambulatory Visit | Attending: Cardiovascular Disease | Admitting: Cardiovascular Disease

## 2011-06-30 ENCOUNTER — Other Ambulatory Visit: Payer: Self-pay | Admitting: Cardiovascular Disease

## 2011-06-30 DIAGNOSIS — Z859 Personal history of malignant neoplasm, unspecified: Secondary | ICD-10-CM

## 2011-07-12 ENCOUNTER — Other Ambulatory Visit: Payer: Self-pay | Admitting: Orthopedic Surgery

## 2011-07-12 ENCOUNTER — Encounter (HOSPITAL_COMMUNITY): Payer: Medicare Other

## 2011-07-12 LAB — SURGICAL PCR SCREEN
MRSA, PCR: NEGATIVE
Staphylococcus aureus: NEGATIVE

## 2011-07-12 LAB — NO BLOOD PRODUCTS

## 2011-07-13 ENCOUNTER — Ambulatory Visit (HOSPITAL_COMMUNITY)
Admission: RE | Admit: 2011-07-13 | Discharge: 2011-07-14 | Disposition: A | Discharge status: Home / Self Care | Payer: Medicare Other | Source: Ambulatory Visit | Attending: Orthopedic Surgery | Admitting: Orthopedic Surgery

## 2011-07-13 DIAGNOSIS — I10 Essential (primary) hypertension: Secondary | ICD-10-CM | POA: Insufficient documentation

## 2011-07-13 DIAGNOSIS — M25519 Pain in unspecified shoulder: Secondary | ICD-10-CM | POA: Insufficient documentation

## 2011-07-13 DIAGNOSIS — M24119 Other articular cartilage disorders, unspecified shoulder: Secondary | ICD-10-CM | POA: Insufficient documentation

## 2011-07-13 DIAGNOSIS — M19019 Primary osteoarthritis, unspecified shoulder: Secondary | ICD-10-CM | POA: Insufficient documentation

## 2011-07-13 DIAGNOSIS — K219 Gastro-esophageal reflux disease without esophagitis: Secondary | ICD-10-CM | POA: Insufficient documentation

## 2011-07-13 DIAGNOSIS — Z79899 Other long term (current) drug therapy: Secondary | ICD-10-CM | POA: Insufficient documentation

## 2011-07-13 DIAGNOSIS — M6580 Other synovitis and tenosynovitis, unspecified site: Secondary | ICD-10-CM | POA: Insufficient documentation

## 2011-07-13 DIAGNOSIS — M25819 Other specified joint disorders, unspecified shoulder: Secondary | ICD-10-CM | POA: Insufficient documentation

## 2011-07-13 DIAGNOSIS — Z01812 Encounter for preprocedural laboratory examination: Secondary | ICD-10-CM | POA: Insufficient documentation

## 2011-07-21 NOTE — Discharge Summary (Signed)
  [name], Ashley Hanson                ACCOUNT NO.:  [id number]  MEDICAL RECORD NO.:  [id number]  LOCATION:  1601                         FACILITY:  [facility]  PHYSICIAN:  Jones Broom, MD    DATE OF BIRTH:  1941/05/11  DATE OF ADMISSION:  07/13/2011 DATE OF DISCHARGE:  07/14/2011                              DISCHARGE SUMMARY   FINAL DIAGNOSES:  Status post right shoulder arthroscopy with subacromial decompression, distal clavicle excision, and debridement of partial rotator cuff tear and labral tear.  HOSPITAL COURSE:  [name] was admitted postoperatively after undergoing the above procedure.  She tolerated the procedure in postoperative period very well.  She was kept overnight due to prior complications with general anesthesia for close observation.  She remained very stable overnight with stable vital signs.  She had a interscalene block for pain control, which wore off during the night. The pain was well controlled with IV and oral medications.  In the morning, she was neurovascularly intact and was discharged home in stable condition with her family.  DISCHARGE INSTRUCTIONS:  She will wear the sling for comfort.  She will take off her dressing postoperative day #2 and then apply bandage as needed.  She will begin some gentle range of motion exercises on her own and will use ice.  She will follow up with me in about 7 to 10 days for suture removal, at  which time will get on to physical therapy.  She will have Percocet for pain control.     Jones Broom, MD     JC/MEDQ  D:  07/14/2011  T:  07/14/2011  Job:  [id number]  Electronically Signed by Jones Broom  on 07/21/2011 09:39:43 AM

## 2011-07-21 NOTE — Op Note (Signed)
Ashley Hanson, Ashley Hanson                ACCOUNT NO.:  [id number]  MEDICAL RECORD NO.:  [id number]  LOCATION:  1601                         FACILITY:  [facility]  PHYSICIAN:  Jones Broom, MD    DATE OF BIRTH:  [birth year]  DATE OF PROCEDURE:  07/13/2011 DATE OF DISCHARGE:                              OPERATIVE REPORT   PREOPERATIVE DIAGNOSES: 1. Right shoulder acromioclavicular joint degenerative disease. 2. Right shoulder chronic impingement. 3. Right shoulder glenohumeral osteoarthritis.  POSTOPERATIVE DIAGNOSES: 1. Right shoulder acromioclavicular joint degenerative disease. 2. Right shoulder chronic impingement. 3. Right shoulder glenohumeral osteoarthritis. 4. Partial-thickness undersurface supraspinatus tearing and     degenerative tear of the superior labrum.  PROCEDURE PERFORMED: 1. Right shoulder arthroscopic distal clavicle excision. 2. Right shoulder arthroscopic subacromial decompression. 3. Arthroscopic debridement of partial-thickness undersurface rotator     cuff tear.  Glenohumeral chondromalacia and degenerative labral     tears.  ATTENDING SURGEON:  Berline Lopes, MD  ASSISTANT:  None.  ANESTHESIA:  General endotracheal anesthesia with preoperative interscalene block.  COMPLICATIONS:  None.  DRAINS:  None.  SPECIMENS:  None.  ESTIMATED BLOOD LOSS:  Minimal.  INDICATIONS FOR SURGERY:  The patient is a 70 year old female with a long history of right shoulder pain and problems.  She has had temporary relief with subacromial injections in the past and has been felt to have a component of arthritic pain but also with significant impingement symptoms and pain, with symptomatic AC joint degenerative disease as well.  She failed nonoperative management in the form of injections, therapy and medications, and wished to go forward with surgical treatment.  She understood the risks, benefits and alternatives of the procedure including, but not limited to,  risk of bleeding, infection, damage to neurovascular structures, and risks of anesthesia.  She elected to go forward with surgery.  OPERATIVE FINDINGS:  Examination under anesthesia demonstrated some stiffness consistent with her underlying osteoarthritis.  External rotation 90 degrees was about 75, internal rotation was to about 50.  No instability was noted.  Diagnostic arthroscopy revealed some extensive grade 3 and 4 changes of the articular surface of the glenoid as well as the humeral head.  This was debrided back to stable base.  There were a few small chondral fragments that were free floating which were also debrided.  The superior labrum was noted to be severely degenerated and torn and was debrided back to a stable base.  The biceps root was intact. Subscapularis was intact.  There was some diffuse synovitis in the joint which was debrided as well.  The undersurface of the supraspinatus insertion was extensively frayed but this was debrided back to the healthy-appearing tendon, which was completely intact otherwise.  The subacromial space was noted to have significant hypertrophied bursa and the bursal surface of the rotator cuff was completely intact.  The coracoacromial ligament was taken down to expose the large anterior acromial spur, which was taken down with standard acromioplasty, and distal clavicle excision was also performed.  The [facility] joint was noted to be very arthritic.  DESCRIPTION OF PROCEDURE:  The patient was identified in the preoperative holding area where I personally marked the operative site after  verifying site, side and procedure with the patient.  She was taken back to the operating room after an interscalene block was given by the attending anesthesiologist.  She had anesthesia then given in the operating room and was placed in a beach-chair position with all extremities carefully padded and positioned.  The right upper extremity was prepped and  draped in the standard sterile fashion.  She did receive IV antibiotics before the procedure.  The appropriate time-out procedure was carried out.  The arthroscope was introduced through a standard posterior portal and an anterior portal was established above the subscapularis with needle localization.  Diagnostic arthroscopy was then carried out with findings as described above.  The shaver was introduced through the anterior portal to debride the degenerative superior labrum down to a stable base.  The biceps root was intact.  There was extensive synovitis which was also debrided.  The chondral surfaces of the glenoid and humeral head were carefully examined and debrided of any unstable flaps and an attempt was made to try to smooth out the irregular surface due to the extensive arthritis in the joint.  The undersurface of the rotator cuff was carefully examined.  There was no extensive tearing but there was significant fraying on the undersurface which was debrided back to a stable base.  The underlying tendon was healthy-appearing. The axillary recess was examined and found to have a small chondral free- floating fragment which was removed.  The arthroscope was then introduced in the subacromial space where she was noted to have extensive hypertrophied inflamed bursa.  This was debrided with complete bursectomy arthroscopically.  The underlying bursa of the rotator cuff was completely intact.  The coracoacromial ligament was then taken down with the ArthroCare and the underlying very sharp, large, anterior acromial spur was identified.  This was taken down with a 4-mm bur from lateral to medial with a standard acromioplasty creating a smooth undersurface to the acromion.  The distal clavicle was then exposed arthroscopically and anterior portal was moved into the [facility] joint. Standard distal claviculectomy was performed taking approximately 8-mm of distal clavicle using the 4-mm bur from  anterior and lateral portals. The final resection was viewed from anterior, posterior and lateral portals and felt to be adequate.  The acromioplasty was viewed from lateral portal and found be completely smooth from posterior to anterior.  The shaver was then run into the joint to remove any bone dust and arthroscopic equipment was then removed.  The portals were closed with 3-0 nylon in interrupted fashion and the sterile dressings were applied including Adaptic, 4x4s, ABDs, tape.  The patient was placed in a sling, taken to the recovery room in stable condition.  POSTOPERATIVE PLAN:  She will be discharged home today with her family as long as she remains stable in the recovery room.  She will begin some gentle active motion as soon as she is stable, and will follow up with me in about 1 week.     Jones Broom, MD     JC/MEDQ  D:  07/13/2011  T:  07/13/2011  Job:  [id number]  Electronically Signed by Jones Broom  on 07/21/2011 09:39:46 AM

## 2011-08-21 LAB — CBC
HCT: 28.5 — ABNORMAL LOW
HCT: 32.5 — ABNORMAL LOW
HCT: 35.3 — ABNORMAL LOW
HCT: 37.1
Hemoglobin: 11.2 — ABNORMAL LOW
Hemoglobin: 11.7 — ABNORMAL LOW
Hemoglobin: 12.3
Hemoglobin: 9.9 — ABNORMAL LOW
MCHC: 33
MCHC: 33.2
MCHC: 34.3
MCHC: 34.8
MCV: 90.2
MCV: 91
MCV: 91.7
MCV: 92
Platelets: 144 — ABNORMAL LOW
Platelets: 144 — ABNORMAL LOW
Platelets: 155
Platelets: 197
RBC: 3.11 — ABNORMAL LOW
RBC: 3.61 — ABNORMAL LOW
RBC: 3.84 — ABNORMAL LOW
RBC: 4.08
RDW: 13.5
RDW: 13.6
RDW: 13.8
RDW: 13.9
WBC: 6
WBC: 7.6
WBC: 8
WBC: 9.9

## 2011-08-21 LAB — COMPREHENSIVE METABOLIC PANEL
ALT: 19
AST: 20
Albumin: 3.7
Alkaline Phosphatase: 105
BUN: 14
CO2: 31
Calcium: 9.6
Chloride: 101
Creatinine, Ser: 0.9
GFR calc Af Amer: >60
GFR calc non Af Amer: >60
Glucose, Bld: 87
Potassium: 3.9
Sodium: 143
Total Bilirubin: 0.7
Total Protein: 7.2

## 2011-08-21 LAB — DIFFERENTIAL
Basophils Absolute: 0.1
Basophils Relative: 1
Eosinophils Absolute: 0.4
Eosinophils Relative: 6 — ABNORMAL HIGH
Lymphocytes Relative: 32
Lymphs Abs: 1.9
Monocytes Absolute: 0.6
Monocytes Relative: 10
Neutro Abs: 3.1
Neutrophils Relative %: 51

## 2011-08-21 LAB — BASIC METABOLIC PANEL
BUN: 4 — ABNORMAL LOW
BUN: 6
BUN: 7
BUN: 9
CO2: 28
CO2: 29
CO2: 33 — ABNORMAL HIGH
CO2: 34 — ABNORMAL HIGH
Calcium: 8.4
Calcium: 8.7
Calcium: 8.8
Calcium: 8.9
Chloride: 100
Chloride: 103
Chloride: 98
Chloride: 99
Creatinine, Ser: 0.81
Creatinine, Ser: 0.83
Creatinine, Ser: 0.84
Creatinine, Ser: 1.02
GFR calc Af Amer: >60
GFR calc Af Amer: >60
GFR calc Af Amer: >60
GFR calc Af Amer: >60
GFR calc non Af Amer: 54 — ABNORMAL LOW
GFR calc non Af Amer: >60
GFR calc non Af Amer: >60
GFR calc non Af Amer: >60
Glucose, Bld: 112 — ABNORMAL HIGH
Glucose, Bld: 117 — ABNORMAL HIGH
Glucose, Bld: 125 — ABNORMAL HIGH
Glucose, Bld: 131 — ABNORMAL HIGH
Potassium: 2.8 — ABNORMAL LOW
Potassium: 3.1 — ABNORMAL LOW
Potassium: 3.3 — ABNORMAL LOW
Potassium: 4.1
Sodium: 137
Sodium: 140
Sodium: 140
Sodium: 141

## 2011-08-21 LAB — URINALYSIS, ROUTINE W REFLEX MICROSCOPIC
Bilirubin Urine: NEGATIVE
Glucose, UA: NEGATIVE
Hgb urine dipstick: NEGATIVE
Ketones, ur: NEGATIVE
Nitrite: NEGATIVE
Protein, ur: NEGATIVE
Specific Gravity, Urine: 1.008
Urobilinogen, UA: 0.2
pH: 6.5

## 2011-08-21 LAB — URINE CULTURE
Colony Count: NO GROWTH
Culture: NO GROWTH
Special Requests: NEGATIVE

## 2011-08-21 LAB — PROTIME-INR
INR: 1.2
Prothrombin Time: 16 — ABNORMAL HIGH

## 2011-08-21 LAB — APTT: aPTT: 27

## 2011-08-25 LAB — POCT I-STAT 4, (NA,K, GLUC, HGB,HCT)
Glucose, Bld: 93
HCT: 40
Hemoglobin: 13.6
Operator id: 268271
Potassium: 3.5
Sodium: 139

## 2011-08-25 LAB — POCT HEMOGLOBIN-HEMACUE
Hemoglobin: 11.4 — ABNORMAL LOW
Operator id: 12362

## 2011-08-30 ENCOUNTER — Other Ambulatory Visit: Payer: Self-pay | Admitting: Gastroenterology

## 2011-08-30 HISTORY — PX: ESOPHAGOGASTRODUODENOSCOPY: SHX1529

## 2011-08-30 HISTORY — PX: COLONOSCOPY: SHX174

## 2011-09-01 LAB — I-STAT 8, (EC8 V) (CONVERTED LAB)
Acid-Base Excess: 4 — ABNORMAL HIGH
BUN: 9
Bicarbonate: 29.7 — ABNORMAL HIGH
Chloride: 105
Glucose, Bld: 100 — ABNORMAL HIGH
HCT: 39
Hemoglobin: 13.3
Operator id: 268271
Potassium: 3.6
Sodium: 140
TCO2: 31
pCO2, Ven: 46.9
pH, Ven: 7.41 — ABNORMAL HIGH

## 2011-09-20 ENCOUNTER — Other Ambulatory Visit (HOSPITAL_COMMUNITY)
Admission: RE | Admit: 2011-09-20 | Discharge: 2011-09-20 | Disposition: A | Discharge status: Home / Self Care | Payer: Medicare Other | Source: Ambulatory Visit | Attending: Internal Medicine | Admitting: Internal Medicine

## 2011-09-20 DIAGNOSIS — Z124 Encounter for screening for malignant neoplasm of cervix: Secondary | ICD-10-CM | POA: Insufficient documentation

## 2011-12-14 ENCOUNTER — Other Ambulatory Visit: Payer: Self-pay | Admitting: Neurology

## 2011-12-14 DIAGNOSIS — F0390 Unspecified dementia without behavioral disturbance: Secondary | ICD-10-CM

## 2011-12-14 DIAGNOSIS — F32A Depression, unspecified: Secondary | ICD-10-CM

## 2011-12-14 DIAGNOSIS — M5481 Occipital neuralgia: Secondary | ICD-10-CM

## 2011-12-14 DIAGNOSIS — F329 Major depressive disorder, single episode, unspecified: Secondary | ICD-10-CM

## 2011-12-18 ENCOUNTER — Ambulatory Visit
Admission: RE | Admit: 2011-12-18 | Discharge: 2011-12-18 | Disposition: A | Discharge status: Home / Self Care | Payer: Medicare Other | Source: Ambulatory Visit | Attending: Neurology | Admitting: Neurology

## 2011-12-18 DIAGNOSIS — F0390 Unspecified dementia without behavioral disturbance: Secondary | ICD-10-CM

## 2011-12-18 DIAGNOSIS — F32A Depression, unspecified: Secondary | ICD-10-CM

## 2011-12-18 DIAGNOSIS — F329 Major depressive disorder, single episode, unspecified: Secondary | ICD-10-CM

## 2011-12-18 DIAGNOSIS — M5481 Occipital neuralgia: Secondary | ICD-10-CM

## 2012-07-16 ENCOUNTER — Other Ambulatory Visit: Payer: Self-pay | Admitting: Gastroenterology

## 2012-07-16 DIAGNOSIS — R109 Unspecified abdominal pain: Secondary | ICD-10-CM

## 2012-07-16 DIAGNOSIS — R140 Abdominal distension (gaseous): Secondary | ICD-10-CM

## 2012-07-16 DIAGNOSIS — K219 Gastro-esophageal reflux disease without esophagitis: Secondary | ICD-10-CM

## 2012-07-23 ENCOUNTER — Ambulatory Visit
Admission: RE | Admit: 2012-07-23 | Discharge: 2012-07-23 | Disposition: A | Discharge status: Home / Self Care | Payer: Medicare Other | Source: Ambulatory Visit | Attending: Gastroenterology | Admitting: Gastroenterology

## 2012-07-23 DIAGNOSIS — R109 Unspecified abdominal pain: Secondary | ICD-10-CM

## 2012-07-23 DIAGNOSIS — R140 Abdominal distension (gaseous): Secondary | ICD-10-CM

## 2012-07-23 DIAGNOSIS — K219 Gastro-esophageal reflux disease without esophagitis: Secondary | ICD-10-CM

## 2012-07-23 MED ORDER — IOHEXOL 300 MG/ML  SOLN
100.0000 mL | Freq: Once | INTRAMUSCULAR | Status: AC | PRN
  Administered 2012-07-23: 100 mL via INTRAVENOUS

## 2012-07-24 ENCOUNTER — Other Ambulatory Visit: Payer: Self-pay | Admitting: Gastroenterology

## 2012-07-24 DIAGNOSIS — R109 Unspecified abdominal pain: Secondary | ICD-10-CM

## 2012-07-25 ENCOUNTER — Ambulatory Visit
Admission: RE | Admit: 2012-07-25 | Discharge: 2012-07-25 | Disposition: A | Discharge status: Home / Self Care | Payer: Medicare Other | Source: Ambulatory Visit | Attending: Gastroenterology | Admitting: Gastroenterology

## 2012-07-25 DIAGNOSIS — R109 Unspecified abdominal pain: Secondary | ICD-10-CM

## 2012-08-05 ENCOUNTER — Other Ambulatory Visit: Payer: Self-pay | Admitting: *Deleted

## 2012-09-03 ENCOUNTER — Encounter (INDEPENDENT_AMBULATORY_CARE_PROVIDER_SITE_OTHER): Payer: Self-pay | Admitting: General Surgery

## 2012-09-06 ENCOUNTER — Other Ambulatory Visit (INDEPENDENT_AMBULATORY_CARE_PROVIDER_SITE_OTHER): Payer: Self-pay | Admitting: General Surgery

## 2012-09-06 ENCOUNTER — Encounter (INDEPENDENT_AMBULATORY_CARE_PROVIDER_SITE_OTHER): Payer: Self-pay | Admitting: General Surgery

## 2012-09-06 ENCOUNTER — Ambulatory Visit (INDEPENDENT_AMBULATORY_CARE_PROVIDER_SITE_OTHER): Payer: Medicare Other | Admitting: General Surgery

## 2012-09-06 VITALS — BP 144/76 | HR 72 | Temp 97.3°F | Resp 16 | Ht 65.5 in | Wt 192.0 lb

## 2012-09-06 DIAGNOSIS — K8010 Calculus of gallbladder with chronic cholecystitis without obstruction: Secondary | ICD-10-CM | POA: Insufficient documentation

## 2012-09-06 DIAGNOSIS — I2510 Atherosclerotic heart disease of native coronary artery without angina pectoris: Secondary | ICD-10-CM | POA: Insufficient documentation

## 2012-09-06 DIAGNOSIS — K8066 Calculus of gallbladder and bile duct with acute and chronic cholecystitis without obstruction: Secondary | ICD-10-CM

## 2012-09-06 DIAGNOSIS — I10 Essential (primary) hypertension: Secondary | ICD-10-CM

## 2012-09-06 DIAGNOSIS — Z853 Personal history of malignant neoplasm of breast: Secondary | ICD-10-CM

## 2012-09-06 DIAGNOSIS — R1013 Epigastric pain: Secondary | ICD-10-CM

## 2012-09-06 NOTE — Progress Notes (Signed)
Patient ID: Karl Pock, female   DOB: 1941-07-26, 71 y.o.   MRN: [id number]  Chief Complaint  Patient presents with  . Cholelithiasis    HPI CHYANN AMBROCIO is a 71 y.o. female.  She is referred by Dr. Dorena Cookey for evaluation and management of symptomatic gallstones.  Dr. Tori Milks is her primary care physician.  I have known Ms. Boutelle for many years. I managed her left breast cancer over 20 years ago with left partial mastectomy and axillary lymph node dissection. She has had no known recurrence to date.  She has   had known asymptomatic gallstones for several years, but she only has a 3 month history of symptoms. For the past 3 months she's had episodes of  worsening reflux ,epigastric pain and right upper quadrant pain and nausea. She has excessive belching with this. No vomiting.  This occurs almost daily and tends to be postprandial. She did have some problems with diarrhea but that has gotten better.  She had upper endoscopy and colonoscopy by Dr. Madilyn Fireman on 09/01/2011 and there were no remarkable findings.  Liver function tests are normal on 07/16/2012. CT scan on 07/23/2012 shows probable gallstones otherwise negative. Ultrasound on 07/25/2002 clearly shows multiple gallstones and common bile duct slightly dilated at 9 mm. Of  Comorbidities include coronary artery disease previously followed by Dr. Alanda Amass but not currently. She is not sure whether she has had an MI or not. She has hypertension. History of breast cancer. Chronic reflux. History tubal ligation. Keloid former.  She lives with her 25 year old father who apparently is independent. She does have a daughter who lives in town. HPI  Past Medical History  Diagnosis Date  . Hypertension   . Carpal tunnel syndrome   . Gallstones   . Esophageal reflux   . Asthma   . Osteoporosis   . Cancer     breast cancer  . Migraines   . Abdominal pain   . Nausea   . Confusion   . Depression   . Dementia     early stages  per patient    Past Surgical History  Procedure Date  . Tubal ligation   . Joint replacement     knee  . Esophagogastroduodenoscopy 08/30/11  . Colonoscopy 08/30/11  . Knee surgery   . Dilation and curettage of uterus   . Ankle surgery   . Skin graft   . Shoulder surgery 06/2011  . Wrist surgery 1995    Family History  Problem Relation Age of Onset  . Colon polyps Mother     Social History History  Substance Use Topics  . Smoking status: Never Smoker   . Smokeless tobacco: Never Used  . Alcohol Use: No    Allergies  Allergen Reactions  . Plavix (Clopidogrel Bisulfate) Itching and Other (See Comments)    Causes migraines. Itching all over the body.  . Warfarin And Related Other (See Comments)    Blood thinners - cause migraines in patient.    Current Outpatient Prescriptions  Medication Sig Dispense Refill  . Aspirin 81 MG EC tablet Take 81 mg by mouth daily.      . Calcium Carbonate-Vit D-Min (CALCIUM 1200 PO) Take 1 tablet by mouth 2 (two) times daily.      . Cholecalciferol (VITAMIN D-3 PO) Take 3,000 mg by mouth daily.      Marland Kitchen diltiazem (CARDIZEM CD) 180 MG 24 hr capsule Take 180 mg by mouth daily.      Marland Kitchen  gabapentin (NEURONTIN) 600 MG tablet Take 600 mg by mouth 3 (three) times daily.      . hydrochlorothiazide (HYDRODIURIL) 25 MG tablet Take 25 mg by mouth daily.      Marland Kitchen HYDROcodone-acetaminophen (NORCO/VICODIN) 5-325 MG per tablet Take 1 tablet by mouth as needed.      . loperamide (IMODIUM A-D) 2 MG tablet Take 2 mg by mouth 4 (four) times daily as needed. Patient to take up to 8 tablets per 24 hours as needed.      . meloxicam (MOBIC) 15 MG tablet Take 15 mg by mouth daily.      Marland Kitchen omeprazole (PRILOSEC) 40 MG capsule Take 40 mg by mouth daily.      . propranolol (INDERAL) 80 MG tablet Take 80 mg by mouth 2 (two) times daily.        Review of Systems Review of Systems  Constitutional: Negative for fever, chills and unexpected weight change.  HENT: Negative  for hearing loss, congestion, sore throat, trouble swallowing and voice change.   Eyes: Negative for visual disturbance.  Respiratory: Negative for cough and wheezing.   Cardiovascular: Positive for chest pain. Negative for palpitations and leg swelling.  Gastrointestinal: Positive for nausea, abdominal pain and diarrhea. Negative for vomiting, constipation, blood in stool, abdominal distention and anal bleeding.  Genitourinary: Negative for hematuria, vaginal bleeding and difficulty urinating.  Musculoskeletal: Positive for back pain, joint swelling, arthralgias and gait problem.  Skin: Negative for rash and wound.  Neurological: Negative for seizures, syncope and headaches.  Hematological: Negative for adenopathy. Does not bruise/bleed easily.  Psychiatric/Behavioral: Negative for confusion.    Blood pressure 144/76, pulse 72, temperature 97.3 F (36.3 C), temperature source Temporal, resp. rate 16, height 5' 5.5" (1.664 m), weight 192 lb (87.091 kg).  Physical Exam Physical Exam  Constitutional: She is oriented to person, place, and time. She appears well-developed and well-nourished. No distress.  HENT:  Head: Normocephalic and atraumatic.  Nose: Nose normal.  Mouth/Throat: No oropharyngeal exudate.  Eyes: Conjunctivae normal and EOM are normal. Pupils are equal, round, and reactive to light. Left eye exhibits no discharge. No scleral icterus.  Neck: Neck supple. No JVD present. No tracheal deviation present. No thyromegaly present.  Cardiovascular: Normal rate, regular rhythm, normal heart sounds and intact distal pulses.   No murmur heard. Pulmonary/Chest: Effort normal and breath sounds normal. No respiratory distress. She has no wheezes. She has no rales. She exhibits no tenderness.  Abdominal: Soft. Bowel sounds are normal. She exhibits no distension and no mass. There is tenderness. There is no rebound and no guarding.       Keloid below umbilicus. Mild right upper quadrant and  epigastric tenderness. No mass. No guarding.  Musculoskeletal: She exhibits no edema and no tenderness.       Trace ankle edema.  Lymphadenopathy:    She has no cervical adenopathy.  Neurological: She is alert and oriented to person, place, and time. She exhibits normal muscle tone. Coordination normal.  Skin: Skin is warm. No rash noted. She is not diaphoretic. No erythema. No pallor.  Psychiatric: She has a normal mood and affect. Her behavior is normal. Judgment and thought content normal.    Data Reviewed Dr. Jerl Mina office notes, endoscopy and colonoscopy reports. CT scan and ultrasound. Lab work.  Assessment    Chronic cholecystitis with cholelithiasis. Now with almost daily biliary colic. She will likely benefit from cholecystectomy  Hypertension  History cancer left breast, no recurrence to date  History coronary disease  Status post bilateral tubal ligation  Keloid formation    Plan    Scheduled for laparoscopic cholecystectomy with cholangiogram.  I have discussed the procedure with her in detail. I discussed indications, details, techniques, and the numerous risks of this with her. I've gone over this with outpatient information booklets and given her the booklet as well. She understands these issues. Her questions are answered. She agrees with this plan.  Discontinue aspirin 5 days preop.       Aarin Bluett M 09/06/2012, 4:11 PM

## 2012-09-06 NOTE — Patient Instructions (Signed)
You have gallstones and your gallbladder is diseased.  Your episodes of abdominal pain are almost certainly due to your gallbladder.  You'll be scheduled for laparoscopic cholecystectomy with cholangiogram, possible open.    Laparoscopic Cholecystectomy Laparoscopic cholecystectomy is surgery to remove the gallbladder. The gallbladder is located slightly to the right of center in the abdomen, behind the liver. It is a concentrating and storage sac for the bile produced in the liver. Bile aids in the digestion and absorption of fats. Gallbladder disease (cholecystitis) is an inflammation of your gallbladder. This condition is usually caused by a buildup of gallstones (cholelithiasis) in your gallbladder. Gallstones can block the flow of bile, resulting in inflammation and pain. In severe cases, emergency surgery may be required. When emergency surgery is not required, you will have time to prepare for the procedure. Laparoscopic surgery is an alternative to open surgery. Laparoscopic surgery usually has a shorter recovery time. Your common bile duct may also need to be examined and explored. Your caregiver will discuss this with you if he or she feels this should be done. If stones are found in the common bile duct, they may be removed. LET YOUR CAREGIVER KNOW ABOUT:  Allergies to food or medicine.  Medicines taken, including vitamins, herbs, eyedrops, over-the-counter medicines, and creams.  Use of steroids (by mouth or creams).  Previous problems with anesthetics or numbing medicines.  History of bleeding problems or blood clots.  Previous surgery.  Other health problems, including diabetes and kidney problems.  Possibility of pregnancy, if this applies. RISKS AND COMPLICATIONS All surgery is associated with risks. Some problems that may occur following this procedure include:  Infection.  Damage to the common bile duct, nerves, arteries, veins, or other internal organs such as the  stomach or intestines.  Bleeding.  A stone may remain in the common bile duct. BEFORE THE PROCEDURE  Do not take aspirin for 3 days prior to surgery or blood thinners for 1 week prior to surgery.  Do not eat or drink anything after midnight the night before surgery.  Let your caregiver know if you develop a cold or other infectious problem prior to surgery.  You should be present 60 minutes before the procedure or as directed. PROCEDURE  You will be given medicine that makes you sleep (general anesthetic). When you are asleep, your surgeon will make several small cuts (incisions) in your abdomen. One of these incisions is used to insert a small, lighted scope (laparoscope) into the abdomen. The laparoscope helps the surgeon see into your abdomen. Carbon dioxide gas will be pumped into your abdomen. The gas allows more room for the surgeon to perform your surgery. Other operating instruments are inserted through the other incisions. Laparoscopic procedures may not be appropriate when:  There is major scarring from previous surgery.  The gallbladder is extremely inflamed.  There are bleeding disorders or unexpected cirrhosis of the liver.  A pregnancy is near term.  Other conditions make the laparoscopic procedure impossible. If your surgeon feels it is not safe to continue with a laparoscopic procedure, he or she will perform an open abdominal procedure. In this case, the surgeon will make an incision to open the abdomen. This gives the surgeon a larger view and field to work within. This may allow the surgeon to perform procedures that sometimes cannot be performed with a laparoscope alone. Open surgery has a longer recovery time. AFTER THE PROCEDURE  You will be taken to the recovery area where a nurse will  watch and check your progress.  You may be allowed to go home the same day.  Do not resume physical activities until directed by your caregiver.  You may resume a normal diet  and activities as directed. Document Released: 11/06/2005 Document Revised: 01/29/2012 Document Reviewed: 04/21/2011 [facility] Patient Information 2013 [place], [place].

## 2012-09-09 ENCOUNTER — Other Ambulatory Visit (INDEPENDENT_AMBULATORY_CARE_PROVIDER_SITE_OTHER): Payer: Self-pay | Admitting: General Surgery

## 2012-09-16 ENCOUNTER — Other Ambulatory Visit (INDEPENDENT_AMBULATORY_CARE_PROVIDER_SITE_OTHER): Payer: Self-pay | Admitting: General Surgery

## 2012-09-19 ENCOUNTER — Telehealth (INDEPENDENT_AMBULATORY_CARE_PROVIDER_SITE_OTHER): Payer: Self-pay | Admitting: General Surgery

## 2012-09-19 ENCOUNTER — Other Ambulatory Visit (HOSPITAL_COMMUNITY): Payer: Medicare Other

## 2012-09-19 ENCOUNTER — Encounter (INDEPENDENT_AMBULATORY_CARE_PROVIDER_SITE_OTHER): Payer: Self-pay | Admitting: General Surgery

## 2012-09-19 ENCOUNTER — Other Ambulatory Visit (INDEPENDENT_AMBULATORY_CARE_PROVIDER_SITE_OTHER): Payer: Self-pay | Admitting: General Surgery

## 2012-09-19 ENCOUNTER — Ambulatory Visit (HOSPITAL_COMMUNITY): Payer: Medicare Other

## 2012-09-19 DIAGNOSIS — I10 Essential (primary) hypertension: Secondary | ICD-10-CM

## 2012-09-19 DIAGNOSIS — K8066 Calculus of gallbladder and bile duct with acute and chronic cholecystitis without obstruction: Secondary | ICD-10-CM

## 2012-09-19 NOTE — Telephone Encounter (Signed)
Called patient back based on call earlier today, confirmed she cannot have the nm hepato and ugi on the same day. Patient to have the ugi 09/20/12 and nm hepato on 09/25/12. Patient stated she wanted to make sure we knew she had a knee replacement in 2009 because she was told that if she ever had a surgical procedure she has to have antibiotics in order to make sure she does not develop infection. Patient stated she also has dementia and wanted to be sure we knew that because she could not be sure of what she said at the last office visit. I advised the patient that we knew about the dementia and that was why Dr. Derrell Lolling provided detail in her after visit summary for her to refer back to. I advised the patient to keep the information from that visit so she can refer to it. Patient agreed.

## 2012-09-20 ENCOUNTER — Ambulatory Visit (HOSPITAL_COMMUNITY)
Admission: RE | Admit: 2012-09-20 | Discharge: 2012-09-20 | Disposition: A | Discharge status: Home / Self Care | Payer: Medicare Other | Source: Ambulatory Visit | Attending: General Surgery | Admitting: General Surgery

## 2012-09-20 ENCOUNTER — Encounter (HOSPITAL_COMMUNITY): Payer: Self-pay | Admitting: Pharmacy Technician

## 2012-09-20 DIAGNOSIS — K449 Diaphragmatic hernia without obstruction or gangrene: Secondary | ICD-10-CM | POA: Insufficient documentation

## 2012-09-20 DIAGNOSIS — K219 Gastro-esophageal reflux disease without esophagitis: Secondary | ICD-10-CM | POA: Insufficient documentation

## 2012-09-24 ENCOUNTER — Encounter (HOSPITAL_COMMUNITY): Payer: Self-pay

## 2012-09-24 ENCOUNTER — Encounter (HOSPITAL_COMMUNITY)
Admission: RE | Admit: 2012-09-24 | Discharge: 2012-09-24 | Disposition: A | Discharge status: Home / Self Care | Payer: Medicare Other | Source: Ambulatory Visit | Attending: General Surgery | Admitting: General Surgery

## 2012-09-24 ENCOUNTER — Encounter (HOSPITAL_COMMUNITY): Payer: Self-pay | Admitting: Vascular Surgery

## 2012-09-24 ENCOUNTER — Ambulatory Visit (HOSPITAL_COMMUNITY)
Admission: RE | Admit: 2012-09-24 | Discharge: 2012-09-24 | Disposition: A | Discharge status: Home / Self Care | Payer: Medicare Other | Source: Ambulatory Visit | Attending: General Surgery | Admitting: General Surgery

## 2012-09-24 DIAGNOSIS — Z01818 Encounter for other preprocedural examination: Secondary | ICD-10-CM | POA: Insufficient documentation

## 2012-09-24 DIAGNOSIS — Z01812 Encounter for preprocedural laboratory examination: Secondary | ICD-10-CM | POA: Insufficient documentation

## 2012-09-24 HISTORY — DX: Diverticulosis of intestine, part unspecified, without perforation or abscess without bleeding: K57.90

## 2012-09-24 HISTORY — DX: Angina pectoris, unspecified: I20.9

## 2012-09-24 HISTORY — DX: Unspecified osteoarthritis, unspecified site: M19.90

## 2012-09-24 HISTORY — DX: Other specified postprocedural states: R11.2

## 2012-09-24 HISTORY — DX: Other complications of anesthesia, initial encounter: T88.59XA

## 2012-09-24 HISTORY — DX: Adverse effect of unspecified anesthetic, initial encounter: T41.45XA

## 2012-09-24 HISTORY — DX: Other specified postprocedural states: Z98.890

## 2012-09-24 LAB — URINALYSIS, ROUTINE W REFLEX MICROSCOPIC
Bilirubin Urine: NEGATIVE
Glucose, UA: NEGATIVE mg/dL
Hgb urine dipstick: NEGATIVE
Ketones, ur: NEGATIVE mg/dL
Leukocytes, UA: NEGATIVE
Nitrite: NEGATIVE
Protein, ur: NEGATIVE mg/dL
Specific Gravity, Urine: 1.006 (ref 1.005–1.030)
Urobilinogen, UA: 0.2 mg/dL (ref 0.0–1.0)
pH: 6.5 (ref 5.0–8.0)

## 2012-09-24 LAB — SURGICAL PCR SCREEN
MRSA, PCR: NEGATIVE
Staphylococcus aureus: NEGATIVE

## 2012-09-24 LAB — COMPREHENSIVE METABOLIC PANEL
ALT: 19 U/L (ref 0–35)
AST: 27 U/L (ref 0–37)
Albumin: 3.8 g/dL (ref 3.5–5.2)
Alkaline Phosphatase: 126 U/L — ABNORMAL HIGH (ref 39–117)
BUN: 8 mg/dL (ref 6–23)
CO2: 29 mEq/L (ref 19–32)
Calcium: 10.2 mg/dL (ref 8.4–10.5)
Chloride: 100 mEq/L (ref 96–112)
Creatinine, Ser: 0.74 mg/dL (ref 0.50–1.10)
GFR calc Af Amer: >90 mL/min (ref >90)
GFR calc non Af Amer: 84 mL/min — ABNORMAL LOW (ref >90)
Glucose, Bld: 89 mg/dL (ref 70–99)
Potassium: 3.8 mEq/L (ref 3.5–5.1)
Sodium: 139 mEq/L (ref 135–145)
Total Bilirubin: 0.2 mg/dL — ABNORMAL LOW (ref 0.3–1.2)
Total Protein: 7.8 g/dL (ref 6.0–8.3)

## 2012-09-24 LAB — CBC WITH DIFFERENTIAL/PLATELET
Basophils Absolute: 0 10*3/uL (ref 0.0–0.1)
Basophils Relative: 0 % (ref 0–1)
Eosinophils Absolute: 0.5 10*3/uL (ref 0.0–0.7)
Eosinophils Relative: 7 % — ABNORMAL HIGH (ref 0–5)
HCT: 39.3 % (ref 36.0–46.0)
Hemoglobin: 13.1 g/dL (ref 12.0–15.0)
Lymphocytes Relative: 40 % (ref 12–46)
Lymphs Abs: 3 10*3/uL (ref 0.7–4.0)
MCH: 30.8 pg (ref 26.0–34.0)
MCHC: 33.3 g/dL (ref 30.0–36.0)
MCV: 92.3 fL (ref 78.0–100.0)
Monocytes Absolute: 0.5 10*3/uL (ref 0.1–1.0)
Monocytes Relative: 7 % (ref 3–12)
Neutro Abs: 3.6 10*3/uL (ref 1.7–7.7)
Neutrophils Relative %: 46 % (ref 43–77)
Platelets: 216 10*3/uL (ref 150–400)
RBC: 4.26 MIL/uL (ref 3.87–5.11)
RDW: 13.3 % (ref 11.5–15.5)
WBC: 7.6 10*3/uL (ref 4.0–10.5)

## 2012-09-24 MED ORDER — CHLORHEXIDINE GLUCONATE 4 % EX LIQD: 1.0000 "application " | Freq: Once | CUTANEOUS | Status: DC

## 2012-09-24 NOTE — Progress Notes (Addendum)
Contacted Dr. Kandis Cocking office, left message for Lincoln Maxin requested last office visit notes, stress test, echo, EKG.  Requested Revonda Standard, anesthesia pa to speak with patient regarding anesthesia complications from past surgeries

## 2012-09-24 NOTE — Pre-Procedure Instructions (Signed)
Ashley Hanson  09/24/2012   Your procedure is scheduled on:  Friday October 04, 2012  Report to [facility] at 5:30 AM.  Call this number if you have problems the morning of surgery: [phone]   Remember:   Do not eat food or drink :After Midnight.      Take these medicines the morning of surgery with A SIP OF WATER: diltiazem, lexapro, gabapentin, hydrocodone, propranolol   Do not wear jewelry, make-up or nail polish.  Do not wear lotions, powders, or perfumes.  Do not shave 48 hours prior to surgery. Men may shave face and neck.  Do not bring valuables to the hospital.  Contacts, dentures or bridgework may not be worn into surgery.  Leave suitcase in the car. After surgery it may be brought to your room.  For patients admitted to the hospital, checkout time is 11:00 AM the day of discharge.   Patients discharged the day of surgery will not be allowed to drive home.  Name and phone number of your driver: Koni Savin, daughter  Special Instructions: Shower using CHG 2 nights before surgery and the night before surgery.  If you shower the day of surgery use CHG.  Use special wash - you have one bottle of CHG for all showers.  You should use approximately 1/3 of the bottle for each shower.   Please read over the following fact sheets that you were given: Pain Booklet, Coughing and Deep Breathing, MRSA Information and Surgical Site Infection Prevention

## 2012-09-24 NOTE — Consult Note (Addendum)
Anesthesia Consult:  Patient is a 71 year old female scheduled for laparoscopic versus open cholecystectomy on 10/04/12 by Dr. Derrell Lolling.  History includes breast CA s/p left partial mastectomy > 20 years ago, HTN, obesity, chest pain with reported negative work-up at [facility] (Dr. Alanda Amass) ~ 2012,  asthma, osteoporosis, migraines, early stages of dementia.  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  Anesthesia history is reported as waking up during breast surgery > 20 years ago, post-operative N/V (particularly after a hand procedure > 10 years ago), hypothermia and difficult to awake from anesthesia following left ankle surgery 10/2007, respiratory arrest (did not require re-intubation) on the evening (was already admitted to the surgical floor) of her TKA 08/2008 and then developed confusion.  She reports she received both spinal and general anesthesia for her TKA (she had only wanted spinal anesthesia).  (I'll request her 2009 anesthesia record.)  She underwent right shoulder arthroscopy on 07/13/11, and said overall she felt she tolerated her anesthesia well (records on chart and in Epic under Notes tab for review).    EKG on 09/24/12 showed NSR.  CXR on 09/24/12 showed no acute cardiopulmonary process.  She does have rightward curvature of the thoracolumbar spine.  Labs noted.  Exam shows a pleasant, AA female.  Heart RRR, no significant murmur noted.  Lungs clear.  No carotid bruits or LE edema noted.  She denies chest pain or shortness of breath.  I'll follow-up once additional cardiology and anesthesia records are available.  Shonna Chock, PA-C 09/24/12 1745  Addendum: 09/25/12 1700 Reviewed records from [facility].  Echo on 07/03/11 showed normal LV size. Normal LV systolic function. EF 55%. Doppler flow patterns suggestive of impaired LV relaxation. Abnormal tissue Doppler indicative of increased LA pressure. Borderline left atrial enlargement. Mild to moderate mitral regurgitation.  Trivial pericardial effusion. Nuclear stress test on 07/04/2011 showed evidence of mild thinning in the apical anterolateral region and upper septal wall. Although this may be contributed by breast attenuation, a small zone of nontransmural scar apically and minimal ischemia septally cannot be excluded. Post stress EF 55%. Global left ventricular systolic function is normal. No significant wall motion abnormalities. It was considered a low risk scan.  Dr. Kandis Cocking note mentions a prior cardiac cath in 2005 that "showed no significant abnormality."   Anesthesia records from 08/25/08 and 07/13/11 are on her chart for review as needed.  She will be evaluated by her assigned anesthesiologist on the day of surgery.  If no significant change in her status then anticipate she can proceed as planned.

## 2012-09-25 ENCOUNTER — Ambulatory Visit (HOSPITAL_COMMUNITY)
Admission: RE | Admit: 2012-09-25 | Discharge: 2012-09-25 | Disposition: A | Discharge status: Home / Self Care | Payer: Medicare Other | Source: Ambulatory Visit | Attending: General Surgery | Admitting: General Surgery

## 2012-09-25 ENCOUNTER — Encounter (HOSPITAL_COMMUNITY): Payer: Self-pay

## 2012-09-25 ENCOUNTER — Other Ambulatory Visit (HOSPITAL_COMMUNITY): Payer: Medicare Other

## 2012-09-25 DIAGNOSIS — K8020 Calculus of gallbladder without cholecystitis without obstruction: Secondary | ICD-10-CM | POA: Insufficient documentation

## 2012-09-25 MED ORDER — TECHNETIUM TC 99M MEBROFENIN IV KIT
5.3000 | PACK | Freq: Once | INTRAVENOUS | Status: AC | PRN
  Administered 2012-09-25: 5.3 via INTRAVENOUS

## 2012-10-03 MED ORDER — CEFAZOLIN SODIUM-DEXTROSE 2-3 GM-% IV SOLR
2.0000 g | INTRAVENOUS | Status: AC
  Administered 2012-10-04: 2 g via INTRAVENOUS
  Filled 2012-10-03: qty 50

## 2012-10-03 NOTE — H&P (Signed)
Ashley Hanson    MRN: [id number]   Description: 71 year old female  Provider: Ernestene Mention, MD  Department: [facility]        Diagnoses     Cholelithiasis with acute or chronic cholecystitis   - Primary    574.80    History of breast cancer in female     V10.3    Hypertension     401.9    Coronary artery disease     414.00        Vitals    BP Pulse Temp Resp Ht Wt    144/76 72 97.3 F (36.3 C) (Temporal) 16 5' 5.5" (1.664 m) 192 lb (87.091 kg)    BMI - 31.46 kg/m2                 History and Physical     Ernestene Mention, MD   Patient ID: Ashley Hanson, female   DOB: 15-Oct-1941, 71 y.o.   MRN: [id number]                HPI Ashley Hanson is a 72 y.o. female.  She is referred by Dr. Dorena Cookey for evaluation and management of symptomatic gallstones.  Dr. Tori Milks is her primary care physician.   I have known Ashley Hanson for many years. I managed her left breast cancer over 20 years ago with left partial mastectomy and axillary lymph node dissection. She has had no known recurrence to date.   She has   had known asymptomatic gallstones for several years, but she only has a 3 month history of symptoms. For the past 3 months she's had episodes of  worsening reflux ,epigastric pain and right upper quadrant pain and nausea. She has excessive belching with this. No vomiting.  This occurs almost daily and tends to be postprandial. She did have some problems with diarrhea but that has gotten better.   She had upper endoscopy and colonoscopy by Dr. Madilyn Fireman on 09/01/2011 and there were no remarkable findings.   Liver function tests are normal on 07/16/2012. CT scan on 07/23/2012 shows probable gallstones otherwise negative. Ultrasound on 07/25/2002 clearly shows multiple gallstones and common bile duct slightly dilated at 9 mm. Of   Comorbidities include coronary artery disease previously followed by Dr. Alanda Amass but not currently. She is not sure  whether she has had an MI or not. She has hypertension. History of breast cancer. Chronic reflux. History tubal ligation. Keloid former.   She lives with her 50 year old father who apparently is independent. She does have a daughter who lives in town.      Past Medical History   Diagnosis  Date   .  Hypertension     .  Carpal tunnel syndrome     .  Gallstones     .  Esophageal reflux     .  Asthma     .  Osteoporosis     .  Cancer         breast cancer   .  Migraines     .  Abdominal pain     .  Nausea     .  Confusion     .  Depression     .  Dementia         early stages per patient       Past Surgical History   Procedure  Date   .  Tubal ligation     .  Joint replacement         knee   .  Esophagogastroduodenoscopy  08/30/11   .  Colonoscopy  08/30/11   .  Knee surgery     .  Dilation and curettage of uterus     .  Ankle surgery     .  Skin graft     .  Shoulder surgery  06/2011   .  Wrist surgery  1995       Family History   Problem  Relation  Age of Onset   .  Colon polyps  Mother        Social History History   Substance Use Topics   .  Smoking status:  Never Smoker    .  Smokeless tobacco:  Never Used   .  Alcohol Use:  No       Allergies   Allergen  Reactions   .  Plavix (Clopidogrel Bisulfate)  Itching and Other (See Comments)       Causes migraines. Itching all over the body.   .  Warfarin And Related  Other (See Comments)       Blood thinners - cause migraines in patient.       Current Outpatient Prescriptions   Medication  Sig  Dispense  Refill   .  Aspirin 81 MG EC tablet  Take 81 mg by mouth daily.         .  Calcium Carbonate-Vit D-Min (CALCIUM 1200 PO)  Take 1 tablet by mouth 2 (two) times daily.         .  Cholecalciferol (VITAMIN D-3 PO)  Take 3,000 mg by mouth daily.         Marland Kitchen  diltiazem (CARDIZEM CD) 180 MG 24 hr capsule  Take 180 mg by mouth daily.         Marland Kitchen  gabapentin (NEURONTIN) 600 MG tablet  Take 600 mg by mouth 3 (three)  times daily.         .  hydrochlorothiazide (HYDRODIURIL) 25 MG tablet  Take 25 mg by mouth daily.         Marland Kitchen  HYDROcodone-acetaminophen (NORCO/VICODIN) 5-325 MG per tablet  Take 1 tablet by mouth as needed.         .  loperamide (IMODIUM A-D) 2 MG tablet  Take 2 mg by mouth 4 (four) times daily as needed. Patient to take up to 8 tablets per 24 hours as needed.         .  meloxicam (MOBIC) 15 MG tablet  Take 15 mg by mouth daily.         Marland Kitchen  omeprazole (PRILOSEC) 40 MG capsule  Take 40 mg by mouth daily.         .  propranolol (INDERAL) 80 MG tablet  Take 80 mg by mouth 2 (two) times daily.            Review of Systems   Constitutional: Negative for fever, chills and unexpected weight change.  HENT: Negative for hearing loss, congestion, sore throat, trouble swallowing and voice change.   Eyes: Negative for visual disturbance.  Respiratory: Negative for cough and wheezing.   Cardiovascular: Positive for chest pain. Negative for palpitations and leg swelling.  Gastrointestinal: Positive for nausea, abdominal pain and diarrhea. Negative for vomiting, constipation, blood in stool, abdominal distention and anal bleeding.  Genitourinary: Negative for hematuria, vaginal bleeding and difficulty urinating.  Musculoskeletal: Positive for back pain, joint swelling, arthralgias and gait  problem.  Skin: Negative for rash and wound.  Neurological: Negative for seizures, syncope and headaches.  Hematological: Negative for adenopathy. Does not bruise/bleed easily.  Psychiatric/Behavioral: Negative for confusion.    Blood pressure 144/76, pulse 72, temperature 97.3 F (36.3 C), temperature source Temporal, resp. rate 16, height 5' 5.5" (1.664 m), weight 192 lb (87.091 kg).   Physical Exam   Constitutional: She is oriented to person, place, and time. She appears well-developed and well-nourished. No distress.  HENT:   Head: Normocephalic and atraumatic.   Nose: Nose normal.   Mouth/Throat: No  oropharyngeal exudate.  Eyes: Conjunctivae normal and EOM are normal. Pupils are equal, round, and reactive to light. Left eye exhibits no discharge. No scleral icterus.  Neck: Neck supple. No JVD present. No tracheal deviation present. No thyromegaly present.  Cardiovascular: Normal rate, regular rhythm, normal heart sounds and intact distal pulses.    No murmur heard. Pulmonary/Chest: Effort normal and breath sounds normal. No respiratory distress. She has no wheezes. She has no rales. She exhibits no tenderness.  Abdominal: Soft. Bowel sounds are normal. She exhibits no distension and no mass. There is tenderness. There is no rebound and no guarding.       Keloid below umbilicus. Mild right upper quadrant and epigastric tenderness. No mass. No guarding.  Musculoskeletal: She exhibits no edema and no tenderness.       Trace ankle edema.  Lymphadenopathy:    She has no cervical adenopathy.  Neurological: She is alert and oriented to person, place, and time. She exhibits normal muscle tone. Coordination normal.  Skin: Skin is warm. No rash noted. She is not diaphoretic. No erythema. No pallor.  Psychiatric: She has a normal mood and affect. Her behavior is normal. Judgment and thought content normal.    Data Reviewed Dr. Jerl Mina office notes, endoscopy and colonoscopy reports. CT scan and ultrasound. Lab work.   Assessment Chronic cholecystitis with cholelithiasis. Now with almost daily biliary colic. She will likely benefit from cholecystectomy   Hypertension   History cancer left breast, no recurrence to date   History coronary disease   Status post bilateral tubal ligation   Keloid formation   Plan Scheduled for laparoscopic cholecystectomy with cholangiogram.   I have discussed the procedure with her in detail. I discussed indications, details, techniques, and the numerous risks of this with her. I've gone over this with outpatient information booklets and given  her the booklet as well. She understands these issues. Her questions are answered. She agrees with this plan.   Discontinue aspirin 5 days preop.       Angelia Mould. Derrell Lolling, M.D., [facility] General and Minimally invasive Surgery Breast and Colorectal Surgery Office:   [phone] Pager:   [phone]

## 2012-10-04 ENCOUNTER — Encounter (HOSPITAL_COMMUNITY)
Admission: RE | Disposition: A | Discharge status: Home / Self Care | Payer: Self-pay | Source: Ambulatory Visit | Attending: General Surgery

## 2012-10-04 ENCOUNTER — Ambulatory Visit (HOSPITAL_COMMUNITY): Payer: Medicare Other

## 2012-10-04 ENCOUNTER — Encounter (HOSPITAL_COMMUNITY): Payer: Self-pay | Admitting: Vascular Surgery

## 2012-10-04 ENCOUNTER — Encounter (HOSPITAL_COMMUNITY): Payer: Self-pay | Admitting: *Deleted

## 2012-10-04 ENCOUNTER — Ambulatory Visit (HOSPITAL_COMMUNITY)
Admission: RE | Admit: 2012-10-04 | Discharge: 2012-10-06 | Disposition: A | Discharge status: Home / Self Care | Payer: Medicare Other | Source: Ambulatory Visit | Attending: General Surgery | Admitting: General Surgery

## 2012-10-04 ENCOUNTER — Ambulatory Visit (HOSPITAL_COMMUNITY): Payer: Medicare Other | Admitting: Vascular Surgery

## 2012-10-04 ENCOUNTER — Encounter (HOSPITAL_COMMUNITY): Payer: Self-pay | Admitting: General Practice

## 2012-10-04 DIAGNOSIS — K432 Incisional hernia without obstruction or gangrene: Secondary | ICD-10-CM | POA: Insufficient documentation

## 2012-10-04 DIAGNOSIS — K8010 Calculus of gallbladder with chronic cholecystitis without obstruction: Secondary | ICD-10-CM | POA: Diagnosis present

## 2012-10-04 DIAGNOSIS — K8060 Calculus of gallbladder and bile duct with cholecystitis, unspecified, without obstruction: Secondary | ICD-10-CM | POA: Insufficient documentation

## 2012-10-04 DIAGNOSIS — I10 Essential (primary) hypertension: Secondary | ICD-10-CM | POA: Insufficient documentation

## 2012-10-04 DIAGNOSIS — I2510 Atherosclerotic heart disease of native coronary artery without angina pectoris: Secondary | ICD-10-CM | POA: Insufficient documentation

## 2012-10-04 DIAGNOSIS — K8044 Calculus of bile duct with chronic cholecystitis without obstruction: Secondary | ICD-10-CM | POA: Diagnosis present

## 2012-10-04 DIAGNOSIS — K8064 Calculus of gallbladder and bile duct with chronic cholecystitis without obstruction: Secondary | ICD-10-CM | POA: Insufficient documentation

## 2012-10-04 HISTORY — PX: CHOLECYSTECTOMY: SHX55

## 2012-10-04 HISTORY — DX: Reserved for inherently not codable concepts without codable children: IMO0001

## 2012-10-04 HISTORY — DX: Procedure and treatment not carried out because of patient's decision for reasons of belief and group pressure: Z53.1

## 2012-10-04 SURGERY — LAPAROSCOPIC CHOLECYSTECTOMY WITH INTRAOPERATIVE CHOLANGIOGRAM
Anesthesia: General | Site: Abdomen | Wound class: Contaminated

## 2012-10-04 MED ORDER — ONDANSETRON HCL 4 MG/2ML IJ SOLN: 4.0000 mg | Freq: Once | INTRAMUSCULAR | Status: DC | PRN

## 2012-10-04 MED ORDER — SODIUM CHLORIDE 0.9 % IR SOLN
Status: DC | PRN
  Administered 2012-10-04: 1000 mL

## 2012-10-04 MED ORDER — POTASSIUM CHLORIDE CRYS ER 10 MEQ PO TBCR
10.0000 meq | EXTENDED_RELEASE_TABLET | Freq: Every day | ORAL | Status: DC
  Administered 2012-10-04 – 2012-10-06 (×3): 10 meq via ORAL
  Filled 2012-10-04 (×3): qty 1

## 2012-10-04 MED ORDER — CEFAZOLIN SODIUM-DEXTROSE 2-3 GM-% IV SOLR
2.0000 g | Freq: Three times a day (TID) | INTRAVENOUS | Status: DC
Start: 2012-10-04 — End: 2012-10-06
  Administered 2012-10-04 – 2012-10-06 (×6): 2 g via INTRAVENOUS
  Filled 2012-10-04 (×9): qty 50

## 2012-10-04 MED ORDER — BUPIVACAINE-EPINEPHRINE PF 0.25-1:200000 % IJ SOLN
INTRAMUSCULAR | Status: AC
  Filled 2012-10-04: qty 30

## 2012-10-04 MED ORDER — HYDROMORPHONE HCL PF 1 MG/ML IJ SOLN
0.2500 mg | INTRAMUSCULAR | Status: DC | PRN
  Administered 2012-10-04 (×2): 0.5 mg via INTRAVENOUS

## 2012-10-04 MED ORDER — GLYCOPYRROLATE 0.2 MG/ML IJ SOLN
INTRAMUSCULAR | Status: DC | PRN
  Administered 2012-10-04: 0.2 mg via INTRAVENOUS
  Administered 2012-10-04: .6 mg via INTRAVENOUS

## 2012-10-04 MED ORDER — BUPIVACAINE-EPINEPHRINE 0.25% -1:200000 IJ SOLN
INTRAMUSCULAR | Status: DC | PRN
  Administered 2012-10-04: 30 mL

## 2012-10-04 MED ORDER — HYDROCODONE-ACETAMINOPHEN 5-325 MG PO TABS
1.0000 | ORAL_TABLET | ORAL | Status: DC | PRN
  Administered 2012-10-04 – 2012-10-05 (×3): 2 via ORAL
  Administered 2012-10-06: 1 via ORAL
  Administered 2012-10-06: 2 via ORAL
  Filled 2012-10-04 (×3): qty 2
  Filled 2012-10-04: qty 1
  Filled 2012-10-04: qty 2

## 2012-10-04 MED ORDER — POTASSIUM CHLORIDE IN NACL 20-0.9 MEQ/L-% IV SOLN
INTRAVENOUS | Status: DC
  Administered 2012-10-04 – 2012-10-05 (×4): via INTRAVENOUS
  Filled 2012-10-04 (×7): qty 1000

## 2012-10-04 MED ORDER — ACETAMINOPHEN 10 MG/ML IV SOLN: 1000.0000 mg | Freq: Once | INTRAVENOUS | Status: DC | PRN

## 2012-10-04 MED ORDER — ONDANSETRON HCL 4 MG PO TABS: 4.0000 mg | ORAL_TABLET | Freq: Four times a day (QID) | ORAL | Status: DC | PRN

## 2012-10-04 MED ORDER — FENTANYL CITRATE 0.05 MG/ML IJ SOLN
INTRAMUSCULAR | Status: DC | PRN
  Administered 2012-10-04: 150 ug via INTRAVENOUS
  Administered 2012-10-04 (×2): 50 ug via INTRAVENOUS

## 2012-10-04 MED ORDER — HYDROMORPHONE HCL PF 1 MG/ML IJ SOLN
INTRAMUSCULAR | Status: AC
  Filled 2012-10-04: qty 1

## 2012-10-04 MED ORDER — DEXAMETHASONE SODIUM PHOSPHATE 4 MG/ML IJ SOLN
INTRAMUSCULAR | Status: DC | PRN
  Administered 2012-10-04: 8 mg via INTRAVENOUS

## 2012-10-04 MED ORDER — PROPRANOLOL HCL 80 MG PO TABS
80.0000 mg | ORAL_TABLET | Freq: Two times a day (BID) | ORAL | Status: DC
  Administered 2012-10-06: 80 mg via ORAL
  Filled 2012-10-04 (×5): qty 1

## 2012-10-04 MED ORDER — ROCURONIUM BROMIDE 100 MG/10ML IV SOLN
INTRAVENOUS | Status: DC | PRN
  Administered 2012-10-04: 40 mg via INTRAVENOUS

## 2012-10-04 MED ORDER — EPHEDRINE SULFATE 50 MG/ML IJ SOLN
INTRAMUSCULAR | Status: DC | PRN
  Administered 2012-10-04 (×2): 5 mg via INTRAVENOUS

## 2012-10-04 MED ORDER — ONDANSETRON HCL 4 MG/2ML IJ SOLN: 4.0000 mg | Freq: Four times a day (QID) | INTRAMUSCULAR | Status: DC | PRN

## 2012-10-04 MED ORDER — PROPOFOL 10 MG/ML IV BOLUS
INTRAVENOUS | Status: DC | PRN
  Administered 2012-10-04: 150 mg via INTRAVENOUS

## 2012-10-04 MED ORDER — HYDROCHLOROTHIAZIDE 25 MG PO TABS
25.0000 mg | ORAL_TABLET | Freq: Every day | ORAL | Status: DC
  Administered 2012-10-04: 25 mg via ORAL
  Filled 2012-10-04 (×3): qty 1

## 2012-10-04 MED ORDER — NEOSTIGMINE METHYLSULFATE 1 MG/ML IJ SOLN
INTRAMUSCULAR | Status: DC | PRN
  Administered 2012-10-04: 5 mg via INTRAVENOUS

## 2012-10-04 MED ORDER — SODIUM CHLORIDE 0.9 % IV SOLN
INTRAVENOUS | Status: DC | PRN
  Administered 2012-10-04: 07:00:00

## 2012-10-04 MED ORDER — LACTATED RINGERS IV SOLN
INTRAVENOUS | Status: DC | PRN
  Administered 2012-10-04 (×2): via INTRAVENOUS

## 2012-10-04 MED ORDER — ONDANSETRON HCL 4 MG/2ML IJ SOLN
INTRAMUSCULAR | Status: DC | PRN
  Administered 2012-10-04 (×2): 4 mg via INTRAVENOUS

## 2012-10-04 MED ORDER — GABAPENTIN 600 MG PO TABS
600.0000 mg | ORAL_TABLET | Freq: Two times a day (BID) | ORAL | Status: DC
  Administered 2012-10-04 – 2012-10-06 (×4): 600 mg via ORAL
  Filled 2012-10-04 (×5): qty 1

## 2012-10-04 MED ORDER — DILTIAZEM HCL ER 120 MG PO CP24
120.0000 mg | ORAL_CAPSULE | Freq: Every day | ORAL | Status: DC
  Filled 2012-10-04 (×2): qty 1

## 2012-10-04 MED ORDER — LOSARTAN POTASSIUM 50 MG PO TABS
50.0000 mg | ORAL_TABLET | Freq: Every day | ORAL | Status: DC
Start: 2012-10-04 — End: 2012-10-06
  Administered 2012-10-06: 50 mg via ORAL
  Filled 2012-10-04 (×3): qty 1

## 2012-10-04 MED ORDER — LIDOCAINE HCL (CARDIAC) 20 MG/ML IV SOLN
INTRAVENOUS | Status: DC | PRN
  Administered 2012-10-04: 80 mg via INTRAVENOUS

## 2012-10-04 MED ORDER — ESCITALOPRAM OXALATE 10 MG PO TABS
10.0000 mg | ORAL_TABLET | Freq: Every day | ORAL | Status: DC
  Administered 2012-10-04 – 2012-10-06 (×3): 10 mg via ORAL
  Filled 2012-10-04 (×3): qty 1

## 2012-10-04 MED ORDER — MORPHINE SULFATE 2 MG/ML IJ SOLN
2.0000 mg | INTRAMUSCULAR | Status: DC | PRN
  Administered 2012-10-05: 2 mg via INTRAVENOUS
  Filled 2012-10-04: qty 1

## 2012-10-04 MED ORDER — ARTIFICIAL TEARS OP OINT
TOPICAL_OINTMENT | OPHTHALMIC | Status: DC | PRN
  Administered 2012-10-04: 1 via OPHTHALMIC

## 2012-10-04 SURGICAL SUPPLY — 48 items
ADH SKN CLS APL DERMABOND .7 (GAUZE/BANDAGES/DRESSINGS) ×1
APPLIER CLIP ROT 10 11.4 M/L (STAPLE) ×2
APR CLP MED LRG 11.4X10 (STAPLE) ×1
BAG SPEC RTRVL LRG 6X4 10 (ENDOMECHANICALS) ×1
BLADE SURG ROTATE 9660 (MISCELLANEOUS) IMPLANT
CANISTER SUCTION 2500CC (MISCELLANEOUS) ×2 IMPLANT
CHLORAPREP W/TINT 26ML (MISCELLANEOUS) ×2 IMPLANT
CLIP APPLIE ROT 10 11.4 M/L (STAPLE) ×1 IMPLANT
CLOTH BEACON ORANGE TIMEOUT ST (SAFETY) ×2 IMPLANT
COVER MAYO STAND STRL (DRAPES) ×2 IMPLANT
COVER SURGICAL LIGHT HANDLE (MISCELLANEOUS) ×2 IMPLANT
DECANTER SPIKE VIAL GLASS SM (MISCELLANEOUS) ×3 IMPLANT
DERMABOND ADVANCED (GAUZE/BANDAGES/DRESSINGS) ×1
DERMABOND ADVANCED .7 DNX12 (GAUZE/BANDAGES/DRESSINGS) ×1 IMPLANT
DRAPE C-ARM 42X72 X-RAY (DRAPES) ×2 IMPLANT
DRAPE UTILITY 15X26 W/TAPE STR (DRAPE) ×4 IMPLANT
ELECT REM PT RETURN 9FT ADLT (ELECTROSURGICAL) ×2
ELECTRODE REM PT RTRN 9FT ADLT (ELECTROSURGICAL) ×1 IMPLANT
ENDOLOOP SUT PDS II  0 18 (SUTURE) ×1
ENDOLOOP SUT PDS II 0 18 (SUTURE) IMPLANT
GLOVE BIO SURGEON STRL SZ 6.5 (GLOVE) ×2 IMPLANT
GLOVE BIO SURGEON STRL SZ7.5 (GLOVE) ×1 IMPLANT
GLOVE BIO SURGEON STRL SZ8 (GLOVE) ×1 IMPLANT
GLOVE BIOGEL PI IND STRL 7.0 (GLOVE) IMPLANT
GLOVE BIOGEL PI INDICATOR 7.0 (GLOVE) ×2
GLOVE EUDERMIC 7 POWDERFREE (GLOVE) ×2 IMPLANT
GOWN EXTRA PROTECTION XXL 0583 (GOWNS) ×2 IMPLANT
GOWN PREVENTION PLUS XLARGE (GOWN DISPOSABLE) ×2 IMPLANT
GOWN STRL NON-REIN LRG LVL3 (GOWN DISPOSABLE) ×6 IMPLANT
KIT BASIN OR (CUSTOM PROCEDURE TRAY) ×2 IMPLANT
KIT ROOM TURNOVER OR (KITS) ×2 IMPLANT
NS IRRIG 1000ML POUR BTL (IV SOLUTION) ×2 IMPLANT
PAD ARMBOARD 7.5X6 YLW CONV (MISCELLANEOUS) ×2 IMPLANT
POUCH SPECIMEN RETRIEVAL 10MM (ENDOMECHANICALS) ×2 IMPLANT
SCISSORS LAP 5X35 DISP (ENDOMECHANICALS) ×1 IMPLANT
SET CHOLANGIOGRAPH 5 50 .035 (SET/KITS/TRAYS/PACK) ×2 IMPLANT
SET IRRIG TUBING LAPAROSCOPIC (IRRIGATION / IRRIGATOR) ×2 IMPLANT
SLEEVE ENDOPATH XCEL 5M (ENDOMECHANICALS) ×2 IMPLANT
SPECIMEN JAR SMALL (MISCELLANEOUS) ×2 IMPLANT
SUT MNCRL AB 4-0 PS2 18 (SUTURE) ×3 IMPLANT
SUT NOVA 1 T20/GS 25DT (SUTURE) ×1 IMPLANT
SUT NOVA NAB DX-16 0-1 5-0 T12 (SUTURE) ×2 IMPLANT
TOWEL OR 17X24 6PK STRL BLUE (TOWEL DISPOSABLE) ×2 IMPLANT
TOWEL OR 17X26 10 PK STRL BLUE (TOWEL DISPOSABLE) ×2 IMPLANT
TRAY LAPAROSCOPIC (CUSTOM PROCEDURE TRAY) ×2 IMPLANT
TROCAR XCEL BLUNT TIP 100MML (ENDOMECHANICALS) ×2 IMPLANT
TROCAR XCEL NON-BLD 11X100MML (ENDOMECHANICALS) ×2 IMPLANT
TROCAR XCEL NON-BLD 5MMX100MML (ENDOMECHANICALS) ×2 IMPLANT

## 2012-10-04 NOTE — Preoperative (Signed)
Beta Blockers   Reason not to administer Beta Blockers:Inderal taken this am

## 2012-10-04 NOTE — Op Note (Signed)
Patient Name:           Ashley Hanson   Date of Surgery:        10/04/2012  Pre op Diagnosis:      Chronic cholecystitis with cholelithiasis, umbilical hernia  Post op Diagnosis:    Chronic cholecystitis with cholelithiasis, choledocholithiasis, umbilical hernia  Procedure:                 Laparoscopic cholecystectomy with cholangiogram, repair of umbilical hernia  Surgeon:                     Angelia Mould. Derrell Lolling, M.D., FACS  Assistant:                      Lendon Ka, M.D.  Operative Indications:   Ashley Hanson is a 71 y.o. female. She is referred by Dr. Dorena Cookey for evaluation and management of symptomatic gallstones. Dr. Tori Milks is her primary care physician.  I have known Ms. Mathurin for many years. I managed her left breast cancer over 20 years ago with left partial mastectomy and axillary lymph node dissection. She has had no known recurrence to date.  She has had known asymptomatic gallstones for several years, but she only has a 3 month history of symptoms. For the past 3 months she's had episodes of worsening reflux ,epigastric pain and right upper quadrant pain and nausea. She has excessive belching with this. No vomiting. This occurs almost daily and tends to be postprandial. She did have some problems with diarrhea but that has gotten better.  She had upper endoscopy and colonoscopy by Dr. Madilyn Fireman on 09/01/2011 and there were no remarkable findings.  Liver function tests are normal on 07/16/2012. CT scan on 07/23/2012 shows probable gallstones, umbilical hernia containing fat,  otherwise negative. Ultrasound on 07/25/2002 clearly shows multiple gallstones and common bile duct slightly dilated at 9 mm. She says in the local hernia  Bothers her and causes some pain. Examination as an outpatient is otherwise unremarkable except for a keloid at the infraumbilical site from previous tubal ligation. She is brought to the operating room electively.     Operative Findings:       The  gallbladder was chronically inflamed. The infundibulum and cystic duct were thickwalled. The anatomy of the cystic duct cystic artery and common bile duct were conventional. The cholangiogram shows a slightly dilated biliary system. There were 2 or 3 distal common bile duct stones. We had a little bit of flow of contrast into the duodenum but not much. The intrahepatic and extrahepatic biliary anatomy was otherwise normal. There were no other abnormal findings other than an umbilical hernia with the defect approximately 3 cm in size.  Procedure in Detail:          Following the induction of general endotracheal anesthesia, a surgical time out was performed, the abdomen was prepped and draped in a sterile fashion, intravenous antibiotics were given. A curvilinear transverse incision was made at the superior rim of the umbilicus. Dissection was carried down into the deep subcutaneous tissue. The hernia sac was elevated and debrided. We identified the edges of the hernia defect. There was some omentum present which we reduced. We could feel under the fascia circumferentially and there were no masses or adhesions. We placed a 11 mm trocar and secured with this with some 0 Vicryl sutures. Pneumoperitoneum was created. An 11 mm trocar was placed in the subxiphoid region and  two 5 mm trocars in the right mid abdomen. We identified the gallbladder and elevated it and retracted it. I took down some chronic  soft adhesions between the duodenum and the infundibulum of the gallbladder. We could see the common duct was somewhat dilated. We slowly dissected the peritoneum off of the neck of the gallbladder. We isolated the cystic artery as it went over the wall of the gallbladder, secured it with metal clips and divided. This allowed me to develop a large window behind the cystic duct until we were satisfied that we had a significant length of cystic duct. A cholangiogram catheter was inserted into the cystic duct and  cholangiograms obtained using the C-arm. The cholangiogram revealed distal common bile duct stones, minimal drainage of contrast in the duodenum, and otherwise normal anatomy. The cholangiogram catheter was removed, the cystic duct was secured with multiple clips and Endoloop and divided. The gallbladder was dissected from its bed with electrocautery placed in a specimen bag and removed. We copiously irrigated the subhepatic and subphrenic spaces. Irrigation fluid became clear. There is no evidence of bleeding or bile leak. The pneumoperitoneum was released the trocars were removed. I repaired  the umbilical  hernia with about 7 or 8 interrupted sutures of #1 Novofil. This provided very secure primary repair the wounds were irrigated with saline and the skin incisions closed with subcuticular 4-0 Monocryl and Dermabond. The patient tolerated the procedure well taken to recovery in stable condition. EBL 15 cc. Counts correct. Complications none. We plan to call Dr. Jerl Mina  group postop to schedule for ERCP and removal of common duct stones.     Angelia Mould. Derrell Lolling, M.D., FACS General and Minimally Invasive Surgery Breast and Colorectal Surgery  10/04/2012 9:14 AM

## 2012-10-04 NOTE — Anesthesia Postprocedure Evaluation (Signed)
  Anesthesia Post-op Note  Patient: Ashley Hanson  Procedure(s) Performed: Procedure(s) (LRB) with comments: LAPAROSCOPIC CHOLECYSTECTOMY WITH INTRAOPERATIVE CHOLANGIOGRAM (N/A) - laparosopic cholecystectomy with cholangiogram and repair of umbilical hernia  Patient Location: PACU  Anesthesia Type:General  Level of Consciousness: awake  Airway and Oxygen Therapy: Patient Spontanous Breathing  Post-op Pain: mild  Post-op Assessment: Post-op Vital signs reviewed  Post-op Vital Signs: Reviewed  Complications: No apparent anesthesia complications

## 2012-10-04 NOTE — Anesthesia Postprocedure Evaluation (Signed)
  Anesthesia Post-op Note  Patient: Ashley Hanson  Procedure(s) Performed: Procedure(s) (LRB) with comments: LAPAROSCOPIC CHOLECYSTECTOMY WITH INTRAOPERATIVE CHOLANGIOGRAM (N/A) - laparosopic cholecystectomy with cholangiogram and repair of umbilical hernia  Patient Location: PACU  Anesthesia Type:General  Level of Consciousness: awake, alert  and oriented  Airway and Oxygen Therapy: Patient Spontanous Breathing and Patient connected to nasal cannula oxygen  Post-op Pain: mild  Post-op Assessment: Post-op Vital signs reviewed and Patient's Cardiovascular Status Stable  Post-op Vital Signs: stable  Complications: No apparent anesthesia complications

## 2012-10-04 NOTE — Consult Note (Signed)
Referring Provider: Dr. Derrell Lolling Primary Care Physician:  Enrique Sack, MD Primary Gastroenterologist:  Dr. Madilyn Fireman  Reason for Consultation:  Common bile duct stones  HPI: Ashley Hanson is a 71 y.o. female s/p lap chole today who had a positive IOC showing two small filling defects in the distal CBD without flow into the duodenum. Has been having epigastric and RUQ pain for the past 3 months with GERD symptoms as well. Normal LFTs in August 2013. CT in September showed gallstones and U/S showed gallstones and CBD of 9mm. Had elective lap chole today and admitted due to +IOC with 2 filling defects concerning for stones in the distal CBD. Awake and feels ok. Daughter at bedside.     Past Medical History  Diagnosis Date  . Gallstones   . Esophageal reflux   . Osteoporosis   . Cancer     breast cancer  . Migraines   . Abdominal pain   . Nausea   . Confusion   . Depression   . Dementia     early stages per patient  . Irritable bowel   . Diverticulosis   . Hypertension     sees Dr. Dorma Russell J.Green  . Bronchitis   . Dementia     "early stages per patient" and long term memory loss  . Carpal tunnel syndrome     bilaterally  . Arthritis   . Cataracts, bilateral   . Complication of anesthesia     "difficutly waking up and hypothermia and stopped breathing"  . PONV (postoperative nausea and vomiting)   . Anginal pain     sees Dr Alanda Amass; reportedly negative stress/echo 2012  . Refusal of blood transfusions as patient is Jehovah's Witness     Past Surgical History  Procedure Date  . Tubal ligation   . Esophagogastroduodenoscopy 08/30/11  . Colonoscopy 08/30/11  . Knee surgery   . Dilation and curettage of uterus   . Ankle surgery   . Skin graft   . Shoulder surgery 06/2011  . Wrist surgery 1995  . Joint replacement     knee - right  . Breast surgery     x 4  . Cardiovascular stress test   . Doppler echocardiography     hx   . Keloid excision     from bilateral  earlobes  . Laparoscopic cholecystectomy w/ cholangiography 10/04/2012    Prior to Admission medications   Medication Sig Start Date End Date Taking? Authorizing Provider  Aspirin 81 MG EC tablet Take 81 mg by mouth daily.   Yes Historical Provider, MD  Calcium Carbonate-Vit D-Min (CALCIUM 1200 PO) Take 1 tablet by mouth 2 (two) times daily.   Yes Historical Provider, MD  Cholecalciferol (VITAMIN D-3 PO) Take 1 tablet by mouth daily.   Yes Historical Provider, MD  diltiazem (DILACOR XR) 120 MG 24 hr capsule Take 120 mg by mouth daily.   Yes Historical Provider, MD  escitalopram (LEXAPRO) 10 MG tablet Take 10 mg by mouth daily.   Yes Historical Provider, MD  gabapentin (NEURONTIN) 600 MG tablet Take 600 mg by mouth 2 (two) times daily.    Yes Historical Provider, MD  hydrochlorothiazide (HYDRODIURIL) 25 MG tablet Take 25 mg by mouth daily.   Yes Historical Provider, MD  HYDROcodone-acetaminophen (NORCO/VICODIN) 5-325 MG per tablet Take 1 tablet by mouth 2 (two) times daily as needed. For pain   Yes Historical Provider, MD  losartan (COZAAR) 50 MG tablet Take 50 mg by mouth daily.  Yes Historical Provider, MD  potassium chloride (K-DUR,KLOR-CON) 10 MEQ tablet Take 10 mEq by mouth daily.   Yes Historical Provider, MD  propranolol (INDERAL) 80 MG tablet Take 80 mg by mouth 2 (two) times daily.   Yes Historical Provider, MD  loperamide (IMODIUM A-D) 2 MG tablet Take 2 mg by mouth 4 (four) times daily as needed. For diarrhea    Historical Provider, MD    Scheduled Meds:   . [COMPLETED]  ceFAZolin (ANCEF) IV  2 g Intravenous On Call to OR  .  ceFAZolin (ANCEF) IV  2 g Intravenous Q8H  . diltiazem  120 mg Oral Daily  . escitalopram  10 mg Oral Daily  . gabapentin  600 mg Oral BID  . hydrochlorothiazide  25 mg Oral Daily  . HYDROmorphone      . losartan  50 mg Oral Daily  . potassium chloride  10 mEq Oral Daily  . propranolol  80 mg Oral BID   Continuous Infusions:   . 0.9 % NaCl with KCl  20 mEq / L     PRN Meds:.HYDROcodone-acetaminophen, morphine injection, ondansetron (ZOFRAN) IV, ondansetron, [DISCONTINUED] acetaminophen, [DISCONTINUED] bupivacaine-EPINEPHrine, [DISCONTINUED]  HYDROmorphone (DILAUDID) injection, [DISCONTINUED] Omnipaque 300 mg/mL (50 mL) in 0.9% normal saline (50 mL), [DISCONTINUED] ondansetron (ZOFRAN) IV, [DISCONTINUED] sodium chloride irrigation, [DISCONTINUED] sodium chloride irrigation  Allergies as of 09/06/2012 - Review Complete 09/06/2012  Allergen Reaction Noted  . Plavix (clopidogrel bisulfate) Itching and Other (See Comments) 09/06/2012  . Warfarin and related Other (See Comments) 09/03/2012    Family History  Problem Relation Age of Onset  . Colon polyps Mother     History   Social History  . Marital Status: Widowed    Spouse Name: N/A    Number of Children: N/A  . Years of Education: N/A   Occupational History  . Not on file.   Social History Main Topics  . Smoking status: Never Smoker   . Smokeless tobacco: Never Used  . Alcohol Use: No  . Drug Use: No  . Sexually Active: Not on file   Other Topics Concern  . Not on file   Social History Narrative  . No narrative on file    Review of Systems: All negative from GI standpoint except as stated above in HPI.  Physical Exam: Vital signs: Filed Vitals:   10/04/12 1107  BP: 113/55  Pulse: 52  Temp: 97.9 F (36.6 C)  Resp: 18   Last BM Date: 10/03/12 General:   Alert,  Well-developed, well-nourished, pleasant and cooperative in NAD HEENT: anicteric Neck: nontender Lungs:  Clear throughout to auscultation.   No wheezes, crackles, or rhonchi. No acute distress. Heart:  Regular rate and rhythm; no murmurs, clicks, rubs,  or gallops. Abdomen: mild tenderness in epigastric area, lap port sites noted, mild distention  Rectal:  Deferred Ext: no edema  GI:  Lab Results: None  Studies/Results: Dg Cholangiogram Operative  10/04/2012  Intraoperative cholangiogram   History:  Cholelithiasis.  Findings:  The gallbladder has been removed, and the cystic duct has been cannulated.  There are filling defects in the distal common bile duct consistent with calculi.  There appears to be obstruction of the distal common bile duct without appreciable flow in the duodenum.  There is prominence of the common hepatic and common bile ducts. The intrahepatic biliary ducts appear normal.  Conclusion:  Calculi obstructing the distal common bile duct. There is no appreciable flow of contrast via the common bile duct into the duodenum.  Original Report Authenticated By: Bretta Bang, M.D.     Impression/Plan: 71yo s/p lap chole with CBD stones on intraoperative cholangiogram with no contrast flow into duodenum. ERCP indicated and risks/benefits discussed and patient agrees to proceed. NPO p MN for ERCP tomorrow morning 9AM by Dr. Ewing Schlein. Labs to be checked.    LOS: 0 days   Alzora Ha C.  10/04/2012, 1:17 PM

## 2012-10-04 NOTE — Anesthesia Preprocedure Evaluation (Addendum)
Anesthesia Evaluation  Patient identified by MRN, date of birth, ID band Patient awake    Reviewed: Allergy & Precautions, H&P , NPO status , Patient's Chart, lab work & pertinent test results, reviewed documented beta blocker date and time   History of Anesthesia Complications (+) PONV  Airway Mallampati: II TM Distance: >3 FB     Dental  (+) Edentulous Upper and Dental Advisory Given   Pulmonary  breath sounds clear to auscultation        Cardiovascular hypertension, Pt. on home beta blockers + CAD Rhythm:Regular Rate:Normal     Neuro/Psych Depression    GI/Hepatic GERD-  ,  Endo/Other    Renal/GU      Musculoskeletal   Abdominal   Peds  Hematology   Anesthesia Other Findings   Reproductive/Obstetrics                         Anesthesia Physical Anesthesia Plan  ASA: II  Anesthesia Plan: General   Post-op Pain Management:    Induction: Intravenous  Airway Management Planned: Oral ETT  Additional Equipment:   Intra-op Plan:   Post-operative Plan: Extubation in OR  Informed Consent: I have reviewed the patients History and Physical, chart, labs and discussed the procedure including the risks, benefits and alternatives for the proposed anesthesia with the patient or authorized representative who has indicated his/her understanding and acceptance.     Plan Discussed with: CRNA and Surgeon  Anesthesia Plan Comments: (Htn Symptomatic cholelithiasis GERD  Plan GA with oral ETT)        Anesthesia Quick Evaluation

## 2012-10-04 NOTE — Anesthesia Procedure Notes (Signed)
Procedure Name: Intubation Date/Time: 10/04/2012 7:40 AM Performed by: Sherie Don Pre-anesthesia Checklist: Patient identified, Emergency Drugs available, Suction available, Patient being monitored and Timeout performed Patient Re-evaluated:Patient Re-evaluated prior to inductionOxygen Delivery Method: Circle system utilized Preoxygenation: Pre-oxygenation with 100% oxygen Intubation Type: IV induction Ventilation: Oral airway inserted - appropriate to patient size and Two handed mask ventilation required Laryngoscope Size: Mac and 3 Grade View: Grade I Tube type: Oral Tube size: 7.5 mm Airway Equipment and Method: Stylet Placement Confirmation: ETT inserted through vocal cords under direct vision,  positive ETCO2 and breath sounds checked- equal and bilateral Secured at: 23 cm Tube secured with: Tape Dental Injury: Teeth and Oropharynx as per pre-operative assessment

## 2012-10-04 NOTE — Transfer of Care (Signed)
Immediate Anesthesia Transfer of Care Note  Patient: Ashley Hanson  Procedure(s) Performed: Procedure(s) (LRB) with comments: LAPAROSCOPIC CHOLECYSTECTOMY WITH INTRAOPERATIVE CHOLANGIOGRAM (N/A) - laparosopic cholecystectomy with cholangiogram and repair of umbilical hernia  Patient Location: PACU  Anesthesia Type:General  Level of Consciousness: sedated and patient cooperative  Airway & Oxygen Therapy: Patient Spontanous Breathing and Patient connected to face mask  Post-op Assessment: Report given to PACU RN and Post -op Vital signs reviewed and stable  Post vital signs: Reviewed and stable  Complications: No apparent anesthesia complications

## 2012-10-04 NOTE — Interval H&P Note (Signed)
History and Physical Interval Note:  10/04/2012 7:18 AM  Ashley Hanson  has presented today for surgery, with the diagnosis of gallstones  The goals and the various methods of treatment have been discussed with the patient and family. After consideration of risks, benefits and other options for treatment, the patient has consented to  Procedure(s) (LRB) with comments: LAPAROSCOPIC CHOLECYSTECTOMY WITH INTRAOPERATIVE CHOLANGIOGRAM (N/A) - laparosopic cholecystectomy with cholangiogram possible open as a surgical intervention .  The patient's history has been reviewed, patient examined today, no change in status, stable for surgery.  I have reviewed the patient's chart and labs.  Questions were answered to the patient's satisfaction.     Ernestene Mention

## 2012-10-05 ENCOUNTER — Encounter (HOSPITAL_COMMUNITY)
Admission: RE | Disposition: A | Discharge status: Home / Self Care | Payer: Self-pay | Source: Ambulatory Visit | Attending: General Surgery

## 2012-10-05 ENCOUNTER — Ambulatory Visit (HOSPITAL_COMMUNITY): Payer: Medicare Other

## 2012-10-05 ENCOUNTER — Encounter (HOSPITAL_COMMUNITY): Payer: Self-pay | Admitting: *Deleted

## 2012-10-05 HISTORY — PX: ERCP: SHX5425

## 2012-10-05 LAB — COMPREHENSIVE METABOLIC PANEL
ALT: 78 U/L — ABNORMAL HIGH (ref 0–35)
AST: 80 U/L — ABNORMAL HIGH (ref 0–37)
Albumin: 2.9 g/dL — ABNORMAL LOW (ref 3.5–5.2)
Alkaline Phosphatase: 97 U/L (ref 39–117)
BUN: 10 mg/dL (ref 6–23)
CO2: 24 mEq/L (ref 19–32)
Calcium: 8.5 mg/dL (ref 8.4–10.5)
Chloride: 102 mEq/L (ref 96–112)
Creatinine, Ser: 0.68 mg/dL (ref 0.50–1.10)
GFR calc Af Amer: >90 mL/min (ref >90)
GFR calc non Af Amer: 86 mL/min — ABNORMAL LOW (ref >90)
Glucose, Bld: 120 mg/dL — ABNORMAL HIGH (ref 70–99)
Potassium: 3.8 mEq/L (ref 3.5–5.1)
Sodium: 134 mEq/L — ABNORMAL LOW (ref 135–145)
Total Bilirubin: 0.5 mg/dL (ref 0.3–1.2)
Total Protein: 6.6 g/dL (ref 6.0–8.3)

## 2012-10-05 LAB — CBC
HCT: 33.8 % — ABNORMAL LOW (ref 36.0–46.0)
Hemoglobin: 11.3 g/dL — ABNORMAL LOW (ref 12.0–15.0)
MCH: 30.5 pg (ref 26.0–34.0)
MCHC: 33.4 g/dL (ref 30.0–36.0)
MCV: 91.4 fL (ref 78.0–100.0)
Platelets: 145 10*3/uL — ABNORMAL LOW (ref 150–400)
RBC: 3.7 MIL/uL — ABNORMAL LOW (ref 3.87–5.11)
RDW: 13.4 % (ref 11.5–15.5)
WBC: 13.5 10*3/uL — ABNORMAL HIGH (ref 4.0–10.5)

## 2012-10-05 SURGERY — ERCP, WITH INTERVENTION IF INDICATED
Anesthesia: Moderate Sedation

## 2012-10-05 MED ORDER — DIPHENHYDRAMINE HCL 50 MG/ML IJ SOLN
INTRAMUSCULAR | Status: AC
  Filled 2012-10-05: qty 1

## 2012-10-05 MED ORDER — MIDAZOLAM HCL 5 MG/ML IJ SOLN
INTRAMUSCULAR | Status: AC
  Filled 2012-10-05: qty 4

## 2012-10-05 MED ORDER — BUTAMBEN-TETRACAINE-BENZOCAINE 2-2-14 % EX AERO
INHALATION_SPRAY | CUTANEOUS | Status: DC | PRN
  Administered 2012-10-05: 2 via TOPICAL

## 2012-10-05 MED ORDER — SODIUM CHLORIDE 0.9 % IV SOLN
INTRAVENOUS | Status: DC | PRN
  Administered 2012-10-05: 10:00:00

## 2012-10-05 MED ORDER — MIDAZOLAM HCL 10 MG/2ML IJ SOLN
INTRAMUSCULAR | Status: DC | PRN
  Administered 2012-10-05 (×3): 2 mg via INTRAVENOUS

## 2012-10-05 MED ORDER — FENTANYL CITRATE 0.05 MG/ML IJ SOLN
INTRAMUSCULAR | Status: AC
  Filled 2012-10-05: qty 4

## 2012-10-05 MED ORDER — FENTANYL CITRATE 0.05 MG/ML IJ SOLN
INTRAMUSCULAR | Status: DC | PRN
  Administered 2012-10-05 (×3): 25 ug via INTRAVENOUS

## 2012-10-05 MED ORDER — GLUCAGON HCL (RDNA) 1 MG IJ SOLR
INTRAMUSCULAR | Status: AC
  Filled 2012-10-05: qty 2

## 2012-10-05 MED ORDER — SODIUM CHLORIDE 0.9 % IV SOLN
INTRAVENOUS | Status: DC
  Administered 2012-10-05: 500 mL via INTRAVENOUS

## 2012-10-05 NOTE — Progress Notes (Signed)
1 Day Post-Op  Subjective: Stable and alert postop. No nausea or vomiting. Voiding uneventfully. Pain control good. Vital signs stable.  ADM lab work pending. Appreciate GI evaluation by Dr. Bosie Clos.  Objective: Vital signs in last 24 hours: Temp:  [97.7 F (36.5 C)-98.6 F (37 C)] 98.1 F (36.7 C) (11/15 2110) Pulse Rate:  [50-64] 60  (11/15 2110) Resp:  [9-18] 18  (11/15 2110) BP: (102-140)/(48-75) 105/50 mmHg (11/15 2110) SpO2:  [94 %-100 %] 97 % (11/15 2110) Weight:  [191 lb 14.4 oz (87.045 kg)] 191 lb 14.4 oz (87.045 kg) (11/15 1110) Last BM Date: 10/03/12  Intake/Output from previous day: 11/15 0701 - 11/16 0700 In: 1460 [P.O.:360; I.V.:1100] Out: 300 [Urine:250; Blood:50] Intake/Output this shift:    General appearance: alert. Friendly. Mental status normal. No distress. Skin warm and dry. Abdomen: Soft. Incisions looked good. Not distended. Minimally tender. Benign postop exam.  Lab Results:  No results found for this or any previous visit (from the past 24 hour(s)).   Studies/Results: @RISRSLT24 @     . [COMPLETED]  ceFAZolin (ANCEF) IV  2 g Intravenous On Call to OR  .  ceFAZolin (ANCEF) IV  2 g Intravenous Q8H  . diltiazem  120 mg Oral Daily  . escitalopram  10 mg Oral Daily  . gabapentin  600 mg Oral BID  . hydrochlorothiazide  25 mg Oral Daily  . [EXPIRED] HYDROmorphone      . losartan  50 mg Oral Daily  . potassium chloride  10 mEq Oral Daily  . propranolol  80 mg Oral BID     Assessment/Plan: s/p Procedure(s): LAPAROSCOPIC CHOLECYSTECTOMY WITH INTRAOPERATIVE CHOLANGIOGRAM  POD #1. Laparoscopic cholecystectomy, cholangiogram, and primary repair of periumbilical incisional hernia. Doing well with no apparent complications.  Choledocholithiasis with obstruction seen on IOC. Check lab work now Tentatively scheduled for ERCP with Dr. Ewing Schlein today.  Hypertension, continue usual medications  If everything goes well,hope to discharge home  tomorrow.    LOS: 1 day    Prentis Langdon M. Derrell Lolling, M.D., [facility] General and Minimally invasive Surgery Breast and Colorectal Surgery Office:   [phone] Pager:   [phone]  10/05/2012  . .prob

## 2012-10-05 NOTE — Progress Notes (Signed)
Ashley Hanson 9:49 AM  Subjective: Patient is doing well from her surgery without any new problems and no obvious complaints  Objective: Vital signs stable afebrile no acute distress exam okay please see preprocedure evaluation labs and intraoperative cholangiogram reviewed  Assessment: Positive intraoperative cholangiogram  Plan: The risks benefits methods of ERCP was re\re discussed and okay to proceed this morning  Ashley Hanson E

## 2012-10-05 NOTE — Op Note (Signed)
[facility] [address] [place], [address]   ERCP PROCEDURE REPORT  PATIENT: Ashley Hanson, Ashley Hanson.  MR# :[id number] BIRTHDATE: 11/07/1941  GENDER: Female ENDOSCOPIST: Vida Rigger, MD REFERRED BY: Claud Kelp, M.D. PROCEDURE DATE:  10/05/2012 PROCEDURE:   ERCP with sphincterotomy/papillotomy and ERCP with removal of calculus/calculi ASA CLASS: INDICATIONS: MEDICATIONS: TOPICAL ANESTHETIC:  DESCRIPTION OF PROCEDURE:   After the risks benefits and alternatives of the procedure were thoroughly explained, informed consent was obtained.  The PENTAX [id number] ERCP endoscope was introduced through the mouth and advanced to the second portion of the duodenum .a normal appearing ampulla was brought into view and there was no obvious endoscopic findings on advancing the scope and using the triple lumen sphincterotome loaded with the JAG Jagwire deep selective cannulation was obtained on the first attempt and a wire was advanced into the intrahepatics and some contrast filled the CBD but we did not overfill the intrahepatics. There was no pancreatic duct injections or wire advancements throughout the procedure and we proceeded with a moderate-sized sphincterotomy and customary fashion until we had adequate biliary drainage and could get the fully bowed sphincterotome easily in and out of the duct and we proceeded with exchanging the sphincterotome with the adjustable 12-15 mm balloon and on the first balloon pull-through a small stone was removed and we proceeded with 3 more balloon pull-throughs without any further stone or debris or sludge and we proceeded with an occlusion cholangiogram and customary fashion which was normal except for some air bubbles and the balloon passed readily through the patent sphincterotomy site and we elected to stop the procedure at this point and there was adequate biliary drainage the wire was removed and the endoscope was  removedand the patient tolerated the procedure well there was no obvious immediate complications       COMPLICATIONS:  none  ENDOSCOPIC IMPRESSION:#1. Status post sphincterotomy and one stone removed 2. Negative occlusion cholangiogram and adequate biliary drainage at the end of the procedure3. No pancreatic duct injection or wire advancement 4. No other obvious endoscopic findings  RECOMMENDATIONS:customary post ERCP orders and if no delayed complications slowly advance diet and hopefully home tomorrow     _______________________________ eSigned:  Vida Rigger, MD 10/05/2012 10:27 AM   VH:QIONGEX Derrell Lolling, MD

## 2012-10-05 NOTE — Progress Notes (Signed)
Bp meds not given, bp 90/60 post procedure.  Will hold bp meds at this time and monitor bp.  Dr. Renaldo Fiddler notified

## 2012-10-06 LAB — CBC WITH DIFFERENTIAL/PLATELET
Basophils Absolute: 0 10*3/uL (ref 0.0–0.1)
Basophils Relative: 0 % (ref 0–1)
Eosinophils Absolute: 0.1 10*3/uL (ref 0.0–0.7)
Eosinophils Relative: 1 % (ref 0–5)
HCT: 33.1 % — ABNORMAL LOW (ref 36.0–46.0)
Hemoglobin: 10.8 g/dL — ABNORMAL LOW (ref 12.0–15.0)
Lymphocytes Relative: 23 % (ref 12–46)
Lymphs Abs: 1.8 10*3/uL (ref 0.7–4.0)
MCH: 30.6 pg (ref 26.0–34.0)
MCHC: 32.6 g/dL (ref 30.0–36.0)
MCV: 93.8 fL (ref 78.0–100.0)
Monocytes Absolute: 0.6 10*3/uL (ref 0.1–1.0)
Monocytes Relative: 8 % (ref 3–12)
Neutro Abs: 5.2 10*3/uL (ref 1.7–7.7)
Neutrophils Relative %: 68 % (ref 43–77)
Platelets: 133 10*3/uL — ABNORMAL LOW (ref 150–400)
RBC: 3.53 MIL/uL — ABNORMAL LOW (ref 3.87–5.11)
RDW: 13.9 % (ref 11.5–15.5)
WBC: 7.7 10*3/uL (ref 4.0–10.5)

## 2012-10-06 LAB — COMPREHENSIVE METABOLIC PANEL
ALT: 47 U/L — ABNORMAL HIGH (ref 0–35)
AST: 43 U/L — ABNORMAL HIGH (ref 0–37)
Albumin: 2.7 g/dL — ABNORMAL LOW (ref 3.5–5.2)
Alkaline Phosphatase: 88 U/L (ref 39–117)
BUN: 7 mg/dL (ref 6–23)
CO2: 23 mEq/L (ref 19–32)
Calcium: 8.4 mg/dL (ref 8.4–10.5)
Chloride: 107 mEq/L (ref 96–112)
Creatinine, Ser: 0.59 mg/dL (ref 0.50–1.10)
GFR calc Af Amer: >90 mL/min (ref >90)
GFR calc non Af Amer: >90 mL/min (ref >90)
Glucose, Bld: 98 mg/dL (ref 70–99)
Potassium: 4.1 mEq/L (ref 3.5–5.1)
Sodium: 139 mEq/L (ref 135–145)
Total Bilirubin: 0.3 mg/dL (ref 0.3–1.2)
Total Protein: 6.2 g/dL (ref 6.0–8.3)

## 2012-10-06 MED ORDER — HYDROCODONE-ACETAMINOPHEN 5-325 MG PO TABS: 1.0000 | ORAL_TABLET | ORAL | Status: DC | PRN

## 2012-10-06 NOTE — Progress Notes (Signed)
1 Day Post-Op  Subjective: Stable. Alert. Wants to go home. ERCP with sphincterotomy and stone removal went well yesterday. Care plan discussed with Dr. Ewing Schlein went well this morning.  Objective: Vital signs in last 24 hours: Temp:  [97.8 F (36.6 C)-98.1 F (36.7 C)] 98.1 F (36.7 C) (11/17 0554) Pulse Rate:  [40-61] 61  (11/17 0554) Resp:  [14-20] 16  (11/17 0554) BP: (90-139)/(51-92) 108/71 mmHg (11/17 0554) SpO2:  [93 %-100 %] 97 % (11/17 0554) Last BM Date: 10/02/12  Intake/Output from previous day: 11/16 0701 - 11/17 0700 In: 360 [P.O.:360] Out: -  Intake/Output this shift: Total I/O In: 120 [P.O.:120] Out: -   General appearance: alert. No distress GI: soft, non-tender; bowel sounds normal; no masses,  no organomegaly  Lab Results:  Results for orders placed during the hospital encounter of 10/04/12 (from the past 24 hour(s))  CBC WITH DIFFERENTIAL     Status: Abnormal   Collection Time   10/06/12  5:00 AM      Component Value Range   WBC 7.7  4.0 - 10.5 K/uL   RBC 3.53 (*) 3.87 - 5.11 MIL/uL   Hemoglobin 10.8 (*) 12.0 - 15.0 g/dL   HCT 40.9 (*) 81.1 - 91.4 %   MCV 93.8  78.0 - 100.0 fL   MCH 30.6  26.0 - 34.0 pg   MCHC 32.6  30.0 - 36.0 g/dL   RDW 78.2  95.6 - 21.3 %   Platelets 133 (*) 150 - 400 K/uL   Neutrophils Relative 68  43 - 77 %   Neutro Abs 5.2  1.7 - 7.7 K/uL   Lymphocytes Relative 23  12 - 46 %   Lymphs Abs 1.8  0.7 - 4.0 K/uL   Monocytes Relative 8  3 - 12 %   Monocytes Absolute 0.6  0.1 - 1.0 K/uL   Eosinophils Relative 1  0 - 5 %   Eosinophils Absolute 0.1  0.0 - 0.7 K/uL   Basophils Relative 0  0 - 1 %   Basophils Absolute 0.0  0.0 - 0.1 K/uL  COMPREHENSIVE METABOLIC PANEL     Status: Abnormal   Collection Time   10/06/12  5:00 AM      Component Value Range   Sodium 139  135 - 145 mEq/L   Potassium 4.1  3.5 - 5.1 mEq/L   Chloride 107  96 - 112 mEq/L   CO2 23  19 - 32 mEq/L   Glucose, Bld 98  70 - 99 mg/dL   BUN 7  6 - 23 mg/dL   Creatinine, Ser 0.86  0.50 - 1.10 mg/dL   Calcium 8.4  8.4 - 57.8 mg/dL   Total Protein 6.2  6.0 - 8.3 g/dL   Albumin 2.7 (*) 3.5 - 5.2 g/dL   AST 43 (*) 0 - 37 U/L   ALT 47 (*) 0 - 35 U/L   Alkaline Phosphatase 88  39 - 117 U/L   Total Bilirubin 0.3  0.3 - 1.2 mg/dL   GFR calc non Af Amer >90  >90 mL/min   GFR calc Af Amer >90  >90 mL/min     Studies/Results: @RISRSLT24 @     .  ceFAZolin (ANCEF) IV  2 g Intravenous Q8H  . diltiazem  120 mg Oral Daily  . escitalopram  10 mg Oral Daily  . gabapentin  600 mg Oral BID  . hydrochlorothiazide  25 mg Oral Daily  . losartan  50 mg Oral Daily  .  potassium chloride  10 mEq Oral Daily  . propranolol  80 mg Oral BID     Assessment/Plan: s/p Procedure(s): ENDOSCOPIC RETROGRADE CHOLANGIOPANCREATOGRAPHY (ERCP)  POD #2. Laparoscopic cholecystectomy, cholangiogram, and primary repair of periumbilical incisional hernia.  Doing well with no apparent complications.  Choledocholithiasis with obstruction seen on IOC.  ERCP with sphincterotomy and stone removal yesterday. Doing well lab work looks good this morning   Hypertension, continue usual medications  Discharge home today. Diet and activities discussed Prescription for Vicodin given Return to see me in office first week of December. She already has this appointment.     LOS: 2 days    Keyaria Lawson M. Derrell Lolling, M.D., [facility] General and Minimally invasive Surgery Breast and Colorectal Surgery Office:   [phone] Pager:   [phone]  10/06/2012  . .prob

## 2012-10-06 NOTE — Progress Notes (Signed)
Ashley Hanson 8:50 AM  Subjective: The patient is doing well from her ERCP and ate  a regular diet and has some discomfort which is different than her pain that brought her to the hospital and probably just soreness from her surgery and no new complaints  Objective: Vital signs stable afebrile abdomen is soft no significant tenderness LFTs decreased other labs okay  Assessment: Status post laparoscopic cholecystectomy and ERCP  Plan: Okay with me to go home and followup with [place] when necessary and I discussed the case with the surgical team  [facility] E

## 2012-10-06 NOTE — Discharge Summary (Signed)
Patient ID: Ashley Hanson [id number] 71 y.o. [birth year]  10/04/2012  Discharge date and time: October 06, 2012  Admitting Physician: Ernestene Mention  Discharge Physician: Ernestene Mention  Admission Diagnoses: gallstones common bile duct stones  Discharge Diagnoses:  Chronic cholecystitis with cholelithiasis Choledocholithiasis Ventral incisional hernia, periumbilical site.  Operations: Procedure(s): ENDOSCOPIC RETROGRADE CHOLANGIOPANCREATOGRAPHY (ERCP)  Admission Condition: good  Discharged Condition: good  Indication for Admission: Ashley Hanson is a 71 y.o. female. She was referred by Dr. Dorena Cookey for evaluation and management of symptomatic gallstones. Dr. Nila Nephew is her primary care physician.  I have known Ms. Clenney for many years. I managed her left breast cancer over 20 years ago with left partial mastectomy and axillary lymph node dissection. She has had no known recurrence to date.  She has had known asymptomatic gallstones for several years, but she only has a 3 month history of symptoms. For the past 3 months she's had episodes of worsening reflux ,epigastric pain and right upper quadrant pain and nausea. She has excessive belching with this. No vomiting. This occurs almost daily and tends to be postprandial. She did have some problems with diarrhea but that has gotten better.  She had upper endoscopy and colonoscopy by Dr. Madilyn Fireman on 09/01/2011 and there were no remarkable findings.  Liver function tests are normal on 07/16/2012. CT scan on 07/23/2012 shows probable gallstones otherwise negative. Ultrasound on 07/25/2002 clearly shows multiple gallstones and common bile duct slightly dilated at 9 mm. Of  Comorbidities include coronary artery disease previously followed by Dr. Alanda Amass but not currently. She is not sure whether she has had an MI or not. She has hypertension. History of breast cancer. Chronic reflux. History tubal ligation. Keloid former. She  is brought to the hospital for elective cholecystectomy.She also has a hernia at the umbilical position where she had tubal ligation performed laparoscopically in the past. We intend to repair that at the same time.   Hospital Course: On the day of admission the patient was taken to the operating room, and underwent laparoscopic cholecystectomy with cholangiogram, and primary repair of her ventral incisional hernia. The cholangiogram showed a couple of filling defects in the distal common bile duct and minimal flow of contrast into the duodenum suggesting partially obstructing choledocholithiasis. Dr. Ewing Schlein from [facility]  was asked to see her. She did well postop and had ERCP on postop day 1. Sphincterotomy was performed and stones were removed. The following morning she was doing well and wanted to go home. Lab work looked good following ERCP with minimal elevation of transaminases. I discussed her care with Dr. Ewing Schlein. On the day of discharge her abdomen is soft and benign. Wounds looked good. She was given instructions in diet and activities. She was advised to return to see me in my office the first week in December. She does not to do anything strenuous for one month so the hernia can heal.  Consults: GI  Significant Diagnostic Studies: Intraoperative cholangiogram. ERCP.  Treatments: surgery: Laparoscopic cholecystectomy with cholangiogram, primary repair of ventral incisional hernia, ERCP with sphincterotomy  Disposition: Home  Patient Instructions:   Rashan, Patient  Home Medication Instructions [id number]   Printed on:10/06/12 0915  Medication Information                    propranolol (INDERAL) 80 MG tablet Take 80 mg by mouth 2 (two) times daily.           hydrochlorothiazide (HYDRODIURIL) 25 MG  tablet Take 25 mg by mouth daily.           gabapentin (NEURONTIN) 600 MG tablet Take 600 mg by mouth 2 (two) times daily.            HYDROcodone-acetaminophen (NORCO/VICODIN) 5-325  MG per tablet Take 1 tablet by mouth 2 (two) times daily as needed. For pain           Calcium Carbonate-Vit D-Min (CALCIUM 1200 PO) Take 1 tablet by mouth 2 (two) times daily.           loperamide (IMODIUM A-D) 2 MG tablet Take 2 mg by mouth 4 (four) times daily as needed. For diarrhea           Aspirin 81 MG EC tablet Take 81 mg by mouth daily.           potassium chloride (K-DUR,KLOR-CON) 10 MEQ tablet Take 10 mEq by mouth daily.           losartan (COZAAR) 50 MG tablet Take 50 mg by mouth daily.           diltiazem (DILACOR XR) 120 MG 24 hr capsule Take 120 mg by mouth daily.           escitalopram (LEXAPRO) 10 MG tablet Take 10 mg by mouth daily.           Cholecalciferol (VITAMIN D-3 PO) Take 1 tablet by mouth daily.           HYDROcodone-acetaminophen (NORCO/VICODIN) 5-325 MG per tablet Take 1-2 tablets by mouth every 4 (four) hours as needed for pain.             Activity: no heavy lifting for 4 weeks Diet: low fat, low cholesterol diet Wound Care: none needed  Follow-up:  With Dr. Derrell Lolling in 2 weeks.  Signed: Angelia Mould. Derrell Lolling, M.D., FACS General and minimally invasive surgery Breast and Colorectal Surgery  10/06/2012, 9:15 AM

## 2012-10-07 ENCOUNTER — Encounter (HOSPITAL_COMMUNITY): Payer: Self-pay

## 2012-10-07 ENCOUNTER — Encounter (HOSPITAL_COMMUNITY): Payer: Self-pay | Admitting: Gastroenterology

## 2012-10-21 ENCOUNTER — Ambulatory Visit (INDEPENDENT_AMBULATORY_CARE_PROVIDER_SITE_OTHER): Payer: Medicare Other | Admitting: General Surgery

## 2012-10-21 ENCOUNTER — Encounter (INDEPENDENT_AMBULATORY_CARE_PROVIDER_SITE_OTHER): Payer: Medicare Other | Admitting: General Surgery

## 2012-10-21 ENCOUNTER — Encounter (INDEPENDENT_AMBULATORY_CARE_PROVIDER_SITE_OTHER): Payer: Self-pay | Admitting: General Surgery

## 2012-10-21 VITALS — BP 110/72 | HR 60 | Temp 98.2°F | Resp 12 | Ht 63.5 in | Wt 194.0 lb

## 2012-10-21 DIAGNOSIS — K8044 Calculus of bile duct with chronic cholecystitis without obstruction: Secondary | ICD-10-CM

## 2012-10-21 DIAGNOSIS — K8040 Calculus of bile duct with cholecystitis, unspecified, without obstruction: Secondary | ICD-10-CM

## 2012-10-21 NOTE — Patient Instructions (Signed)
You have recovered from her gallbladder surgery, and from the postoperative ERCP procedure without any obvious complications.  You may resume normal physical activities, no restrictions, after December 25. In the meantime you should take a long walk every day.  Return to see Dr. Derrell Lolling if further problems arise.

## 2012-10-21 NOTE — Progress Notes (Signed)
Patient ID: Ashley Hanson, female   DOB: December 31, 1940, 71 y.o.   MRN: [id number] History: This patient underwent elective laparoscopic cholecystectomy, cholangiogram, and primary repair of umbilical hernia on November 15. She had common bile duct stones. Dr. Ewing Schlein  performed ERCP and sphincterotomy with removal of a single stone on postop day 1. She has done well. Her soreness and pain have resolved. Her appetite and bowel function are normal. No wound problems.  Exam: Patient looks well. Family member with her. Sclera clear. Abdomen soft. Nontender. All of the incisions look good and particularly the transverse incision above the umbilicus have healed normally. No sign of infection  Assessment: Chronic cholecystitis with cholelithiasis and  choledocholithiasis Uneventful recovery following laparoscopic cholecystectomy, repair umbilical hernia, and postop ERCP with sphincterotomy Remote history of cancer left breast, no evidence of disease  Plan: Diet activities discussed. Okay to resume normal activities after December 25 Return to see me as needed.   Angelia Mould. Derrell Lolling, M.D., [facility] General and Minimally invasive Surgery Breast and Colorectal Surgery Office:   [phone] Pager:   [phone]

## 2012-11-19 ENCOUNTER — Other Ambulatory Visit (HOSPITAL_COMMUNITY): Payer: Self-pay | Admitting: Cardiovascular Disease

## 2012-11-19 DIAGNOSIS — I10 Essential (primary) hypertension: Secondary | ICD-10-CM

## 2012-11-21 ENCOUNTER — Other Ambulatory Visit (HOSPITAL_COMMUNITY): Payer: Self-pay | Admitting: Cardiovascular Disease

## 2012-11-21 DIAGNOSIS — I10 Essential (primary) hypertension: Secondary | ICD-10-CM

## 2012-11-21 DIAGNOSIS — I340 Nonrheumatic mitral (valve) insufficiency: Secondary | ICD-10-CM

## 2012-11-28 ENCOUNTER — Ambulatory Visit (HOSPITAL_COMMUNITY)
Admission: RE | Admit: 2012-11-28 | Discharge: 2012-11-28 | Disposition: A | Discharge status: Home / Self Care | Payer: Medicare Other | Source: Ambulatory Visit | Attending: Cardiovascular Disease | Admitting: Cardiovascular Disease

## 2012-11-28 DIAGNOSIS — I340 Nonrheumatic mitral (valve) insufficiency: Secondary | ICD-10-CM

## 2012-11-28 DIAGNOSIS — I10 Essential (primary) hypertension: Secondary | ICD-10-CM

## 2012-11-28 DIAGNOSIS — R011 Cardiac murmur, unspecified: Secondary | ICD-10-CM | POA: Insufficient documentation

## 2012-11-28 DIAGNOSIS — I379 Nonrheumatic pulmonary valve disorder, unspecified: Secondary | ICD-10-CM | POA: Insufficient documentation

## 2012-11-28 NOTE — Progress Notes (Signed)
2D Echo Performed 11/28/2012    Ashley Hanson, RCS  

## 2012-12-25 ENCOUNTER — Other Ambulatory Visit (HOSPITAL_COMMUNITY): Payer: Self-pay | Admitting: Cardiovascular Disease

## 2012-12-25 DIAGNOSIS — R079 Chest pain, unspecified: Secondary | ICD-10-CM

## 2013-01-03 ENCOUNTER — Encounter (HOSPITAL_COMMUNITY): Payer: Medicare Other

## 2013-01-15 ENCOUNTER — Ambulatory Visit (HOSPITAL_COMMUNITY)
Admission: RE | Admit: 2013-01-15 | Discharge: 2013-01-15 | Disposition: A | Discharge status: Home / Self Care | Payer: Medicare Other | Source: Ambulatory Visit | Attending: Cardiovascular Disease | Admitting: Cardiovascular Disease

## 2013-01-15 DIAGNOSIS — R5383 Other fatigue: Secondary | ICD-10-CM | POA: Insufficient documentation

## 2013-01-15 DIAGNOSIS — R079 Chest pain, unspecified: Secondary | ICD-10-CM

## 2013-01-15 DIAGNOSIS — I10 Essential (primary) hypertension: Secondary | ICD-10-CM | POA: Insufficient documentation

## 2013-01-15 DIAGNOSIS — R5381 Other malaise: Secondary | ICD-10-CM | POA: Insufficient documentation

## 2013-01-15 MED ORDER — TECHNETIUM TC 99M SESTAMIBI GENERIC - CARDIOLITE
10.0000 | Freq: Once | INTRAVENOUS | Status: AC | PRN
  Administered 2013-01-15: 10 via INTRAVENOUS

## 2013-01-15 MED ORDER — TECHNETIUM TC 99M SESTAMIBI GENERIC - CARDIOLITE
30.0000 | Freq: Once | INTRAVENOUS | Status: AC | PRN
  Administered 2013-01-15: 30 via INTRAVENOUS

## 2013-01-15 MED ORDER — REGADENOSON 0.4 MG/5ML IV SOLN
0.4000 mg | Freq: Once | INTRAVENOUS | Status: AC
  Administered 2013-01-15: 0.4 mg via INTRAVENOUS

## 2013-01-15 NOTE — Procedures (Addendum)
[facility] [address] [place] [address] [phone]  Cardiology Nuclear Med Study  Glory Link is a 72 y.o. female     MRN : [id number]     DOB: October 20, 1941  Procedure Date: 01/15/2013  Nuclear Med Background Indication for Stress Test:  Evaluation for Ischemia and PTCA Patency History:  PTCA--01/05/2004 Cardiac Risk Factors: Family History - CAD, Hypertension and Obesity  Symptoms:  Chest Pain and Fatigue   Nuclear Pre-Procedure Caffeine/Decaff Intake:  1:00am NPO After: 11 AM   IV Site: R Forearm  IV 0.9% NS with Angio Cath:  22g  Chest Size (in):  N/A IV Started by: Emmit Pomfret, RN  Height: 5\' 6"  (1.676 m)  Cup Size: C  BMI:  Body mass index is 32.13 kg/(m^2). Weight:  199 lb (90.266 kg)   Tech Comments:  n/a    Nuclear Med Study 1 or 2 day study: 1 day  Stress Test Type:  Lexiscan  Order Authorizing Provider:  Hazeline Junker   Resting Radionuclide: Technetium 43m Sestamibi  Resting Radionuclide Dose: 11.0 mCi   Stress Radionuclide:  Technetium 53m Sestamibi  Stress Radionuclide Dose: 31.5 mCi           Stress Protocol Rest HR: 48 Stress HR:  73  Rest BP:  125/79 Stress BP:  137/82  Exercise Time (min): n/a METS: n/a   Predicted Max HR: 149 bpm % Max HR: 48.99 bpm Rate Pressure Product: 52841  Dose of Adenosine (mg):  n/a Dose of Lexiscan: 0.4 mg  Dose of Atropine (mg): n/a Dose of Dobutamine: n/a mcg/kg/min (at max HR)  Stress Test Technologist: Esperanza Sheets, CCT Nuclear Technologist: Koren Shiver, CNMT   Rest Procedure:  Myocardial perfusion imaging was performed at rest 45 minutes following the intravenous administration of Technetium 42m Sestamibi. Stress Procedure:  The patient received IV Lexiscan 0.4 mg over 15-seconds.  Technetium 76m Sestamibi injected at 30-seconds.  There were no significant changes with Lexiscan.  Quantitative spect images were obtained after a 45 minute  delay.  Transient Ischemic Dilatation (Normal <1.22):  0.91 Lung/Heart Ratio (Normal <0.45):  0.31 QGS EDV:  75 ml QGS ESV:  29 ml LV Ejection Fraction: 61%     Rest ECG: NSR-LVH by voltage only  Stress ECG: No significant change from baseline ECG  QPS Raw Data Images:  Normal; no motion artifact; normal heart/lung ratio. Stress Images:  There is mildly decreased uptake in the apical segment of the anterior wall. Rest Images:  Comparison with the stress images reveals resolution of the anteroapical perfusion defect. Subtraction (SDS):  These findings are consistent with ischemia. The overall territory of ischemia is small.  Impression Exercise Capacity:  Lexiscan with no exercise. BP Response:  Normal blood pressure response. Clinical Symptoms:  No significant symptoms noted. ECG Impression:  No significant ST segment change suggestive of ischemia. Comparison with Prior Nuclear Study: A similar abnormality was noted on the previous study.  Overall Impression:  Low risk stress nuclear study. The perfusion abnormality is small and mild. Ischemia appears to be present, but cannot exclude "shifting breast" attenuation artifact.   LV Wall Motion:  NL LV Function; NL Wall Motion   Krystall Kruckenberg, MD  01/15/2013 6:18 PM

## 2013-02-04 ENCOUNTER — Other Ambulatory Visit: Payer: Self-pay | Admitting: Orthopedic Surgery

## 2013-02-04 DIAGNOSIS — M79604 Pain in right leg: Secondary | ICD-10-CM

## 2013-02-05 ENCOUNTER — Ambulatory Visit
Admission: RE | Admit: 2013-02-05 | Discharge: 2013-02-05 | Disposition: A | Discharge status: Home / Self Care | Payer: Medicare Other | Source: Ambulatory Visit | Attending: Orthopedic Surgery | Admitting: Orthopedic Surgery

## 2013-02-05 ENCOUNTER — Encounter (INDEPENDENT_AMBULATORY_CARE_PROVIDER_SITE_OTHER): Payer: Self-pay

## 2013-02-05 DIAGNOSIS — M79604 Pain in right leg: Secondary | ICD-10-CM

## 2013-02-06 ENCOUNTER — Other Ambulatory Visit: Payer: Medicare Other

## 2013-02-17 ENCOUNTER — Other Ambulatory Visit: Payer: Self-pay | Admitting: Neurology

## 2013-02-19 ENCOUNTER — Other Ambulatory Visit (HOSPITAL_COMMUNITY): Payer: Self-pay | Admitting: Orthopedic Surgery

## 2013-02-19 ENCOUNTER — Encounter (INDEPENDENT_AMBULATORY_CARE_PROVIDER_SITE_OTHER): Payer: Self-pay

## 2013-02-19 DIAGNOSIS — M25561 Pain in right knee: Secondary | ICD-10-CM

## 2013-02-26 ENCOUNTER — Encounter (HOSPITAL_COMMUNITY)
Admission: RE | Admit: 2013-02-26 | Discharge: 2013-02-26 | Disposition: A | Discharge status: Home / Self Care | Payer: Medicare Other | Source: Ambulatory Visit | Attending: Orthopedic Surgery | Admitting: Orthopedic Surgery

## 2013-02-26 DIAGNOSIS — M25469 Effusion, unspecified knee: Secondary | ICD-10-CM | POA: Insufficient documentation

## 2013-02-26 DIAGNOSIS — M25569 Pain in unspecified knee: Secondary | ICD-10-CM | POA: Insufficient documentation

## 2013-02-26 DIAGNOSIS — M25561 Pain in right knee: Secondary | ICD-10-CM

## 2013-02-26 DIAGNOSIS — Z96659 Presence of unspecified artificial knee joint: Secondary | ICD-10-CM | POA: Insufficient documentation

## 2013-02-26 MED ORDER — TECHNETIUM TC 99M MEDRONATE IV KIT
25.0000 | PACK | Freq: Once | INTRAVENOUS | Status: AC | PRN
  Administered 2013-02-26: 25 via INTRAVENOUS

## 2013-03-06 ENCOUNTER — Encounter: Payer: Self-pay | Admitting: Physical Medicine & Rehabilitation

## 2013-03-31 ENCOUNTER — Encounter: Payer: Self-pay | Admitting: Physical Medicine & Rehabilitation

## 2013-03-31 ENCOUNTER — Ambulatory Visit (HOSPITAL_BASED_OUTPATIENT_CLINIC_OR_DEPARTMENT_OTHER): Payer: Medicare Other | Admitting: Physical Medicine & Rehabilitation

## 2013-03-31 ENCOUNTER — Encounter: Payer: Medicare Other | Attending: Physical Medicine & Rehabilitation

## 2013-03-31 VITALS — BP 107/55 | HR 62 | Resp 16 | Ht 64.0 in | Wt 194.0 lb

## 2013-03-31 DIAGNOSIS — Z5181 Encounter for therapeutic drug level monitoring: Secondary | ICD-10-CM

## 2013-03-31 DIAGNOSIS — Z79899 Other long term (current) drug therapy: Secondary | ICD-10-CM

## 2013-03-31 DIAGNOSIS — M4120 Other idiopathic scoliosis, site unspecified: Secondary | ICD-10-CM

## 2013-03-31 DIAGNOSIS — I10 Essential (primary) hypertension: Secondary | ICD-10-CM | POA: Insufficient documentation

## 2013-03-31 DIAGNOSIS — K219 Gastro-esophageal reflux disease without esophagitis: Secondary | ICD-10-CM | POA: Insufficient documentation

## 2013-03-31 DIAGNOSIS — M25569 Pain in unspecified knee: Secondary | ICD-10-CM | POA: Insufficient documentation

## 2013-03-31 DIAGNOSIS — Z853 Personal history of malignant neoplasm of breast: Secondary | ICD-10-CM | POA: Insufficient documentation

## 2013-03-31 DIAGNOSIS — M25519 Pain in unspecified shoulder: Secondary | ICD-10-CM | POA: Insufficient documentation

## 2013-03-31 DIAGNOSIS — M549 Dorsalgia, unspecified: Secondary | ICD-10-CM | POA: Insufficient documentation

## 2013-03-31 DIAGNOSIS — M47817 Spondylosis without myelopathy or radiculopathy, lumbosacral region: Secondary | ICD-10-CM | POA: Insufficient documentation

## 2013-03-31 DIAGNOSIS — M7061 Trochanteric bursitis, right hip: Secondary | ICD-10-CM

## 2013-03-31 DIAGNOSIS — M810 Age-related osteoporosis without current pathological fracture: Secondary | ICD-10-CM | POA: Insufficient documentation

## 2013-03-31 DIAGNOSIS — M76899 Other specified enthesopathies of unspecified lower limb, excluding foot: Secondary | ICD-10-CM | POA: Insufficient documentation

## 2013-03-31 DIAGNOSIS — M25559 Pain in unspecified hip: Secondary | ICD-10-CM | POA: Insufficient documentation

## 2013-03-31 DIAGNOSIS — Z9089 Acquired absence of other organs: Secondary | ICD-10-CM | POA: Insufficient documentation

## 2013-03-31 DIAGNOSIS — Z96659 Presence of unspecified artificial knee joint: Secondary | ICD-10-CM | POA: Insufficient documentation

## 2013-03-31 DIAGNOSIS — M419 Scoliosis, unspecified: Secondary | ICD-10-CM

## 2013-03-31 NOTE — Progress Notes (Signed)
Subjective:    Patient ID: Ashley Hanson, female    DOB: [birth year], 72 y.o.   MRN: [id number]  HPI Primary complaints back pain secondary complaints shoulder pain hip pain and knee pain. She's also had some neck pain in the past which seemed to respond to epidural injections. I reviewed records from [facility] orthopedic clinic. Patient has been evaluated by spine surgery in the past and not felt to be a good surgical candidate. Has had knee replacement 2009 on the right side by Dr. Renae Fickle. Has also been treated for trochanteric bursitis as well as lumbar facet disease. Has had a lumbar radiofrequency procedure which gave her about 5 months relief. She had this last performed greater than one year ago. Pain Inventory Average Pain 9 Pain Right Now 9 My pain is sharp, burning, stabbing, tingling and aching  In the last 24 hours, has pain interfered with the following? General activity 1 Relation with others 6 Enjoyment of life 1 What TIME of day is your pain at its worst? all Sleep (in general) Poor  Pain is worse with: walking, bending, sitting and standing Pain improves with: therapy/exercise Relief from Meds: 2  Mobility use a cane use a walker ability to climb steps?  no do you drive?  yes  Function I need assistance with the following:  shopping  Neuro/Psych bowel control problems weakness numbness tingling trouble walking confusion depression anxiety  Prior Studies Any changes since last visit?  no  Physicians involved in your care Any changes since last visit?  no   Family History  Problem Relation Age of Onset  . Colon polyps Mother    History   Social History  . Marital Status: Widowed    Spouse Name: N/A    Number of Children: N/A  . Years of Education: N/A   Social History Main Topics  . Smoking status: Never Smoker   . Smokeless tobacco: Never Used  . Alcohol Use: No  . Drug Use: No  . Sexually Active: None   Other Topics Concern  . None    Social History Narrative  . None   Past Surgical History  Procedure Laterality Date  . Tubal ligation    . Esophagogastroduodenoscopy  08/30/11  . Colonoscopy  08/30/11  . Knee surgery    . Dilation and curettage of uterus    . Ankle surgery    . Skin graft    . Shoulder surgery  06/2011  . Wrist surgery  1995  . Joint replacement      knee - right  . Breast surgery      x 4  . Cardiovascular stress test    . Doppler echocardiography      hx   . Keloid excision      from bilateral earlobes  . Laparoscopic cholecystectomy w/ cholangiography  10/04/2012  . Ercp  10/05/2012    Procedure: ENDOSCOPIC RETROGRADE CHOLANGIOPANCREATOGRAPHY (ERCP);  Surgeon: Petra Kuba, MD;  Location: [facility]COPY;  Service: Endoscopy;  Laterality: N/A;  . Cholecystectomy  10/04/2012    Procedure: LAPAROSCOPIC CHOLECYSTECTOMY WITH INTRAOPERATIVE CHOLANGIOGRAM;  Surgeon: Ernestene Mention, MD;  Location: [facility] OR;  Service: General;  Laterality: N/A;  laparosopic cholecystectomy with cholangiogram and repair of umbilical hernia   Past Medical History  Diagnosis Date  . Gallstones   . Esophageal reflux   . Osteoporosis   . Cancer     breast cancer  . Migraines   . Abdominal pain   . Nausea   .  Confusion   . Depression   . Dementia     early stages per patient  . Irritable bowel   . Diverticulosis   . Hypertension     sees Dr. Dorma Russell J.Green  . Bronchitis   . Dementia     "early stages per patient" and long term memory loss  . Carpal tunnel syndrome     bilaterally  . Arthritis   . Cataracts, bilateral   . Complication of anesthesia     "difficutly waking up and hypothermia and stopped breathing"  . PONV (postoperative nausea and vomiting)   . Anginal pain     sees Dr Alanda Amass; reportedly negative stress/echo 2012  . Refusal of blood transfusions as patient is Jehovah's Witness    BP 107/55  Pulse 62  Resp 16  Ht 5\' 4"  (1.626 m)  Wt 194 lb (87.998 kg)  BMI 33.28 kg/m2  SpO2  97%    Review of Systems  Constitutional: Positive for diaphoresis and unexpected weight change.  Gastrointestinal: Positive for abdominal pain.  Musculoskeletal: Positive for back pain, arthralgias and gait problem.  Skin: Positive for rash.  Neurological: Positive for weakness and numbness.       Tingling  Psychiatric/Behavioral: Positive for confusion and dysphoric mood. The patient is nervous/anxious.        Objective:   Physical Exam  Nursing note and vitals reviewed. Constitutional: She is oriented to person, place, and time. She appears well-developed.  obese  HENT:  Head: Normocephalic and atraumatic.  Musculoskeletal:       Right hip: She exhibits tenderness.       Left hip: She exhibits tenderness.  Neurological: She is alert and oriented to person, place, and time. She has normal strength. She displays no atrophy. She exhibits normal muscle tone. Gait abnormal.  Antalgic gait  Psychiatric: She has a normal mood and affect.    Morbidly obese Straight leg raising test is negative Sensation intact to pinprick as well as light touch bilaterally in the lower extremities There is a scoliosis convex to the right in the lumbar area with compensatory left convex thoracic scoliosis. Evidence of right total knee replacement with no evidence of effusion.      Assessment & Plan:  1.  Trochanteric bursitis 2.  Thoracolumbar scoliosis 3.  Lumbar spondylosis plan repeat MBB to determine levels and probable RF to follow Inject R hip today R troch burs injection Informed consent obtained Betadine prep.  25 gauge 3 inch needle 1 ml 40mg  depomedrol, 4 ml 1% lido Pt tol procedure well

## 2013-03-31 NOTE — Patient Instructions (Addendum)
Will need to check urine drug screen prior to prescribing a medicine such as Nucynta ER Will do medial branch blocks for diagnostic/therapeutic reasons in the lumbar spine

## 2013-04-01 NOTE — Addendum Note (Signed)
Addended by: [name] M on: 04/01/2013 09:15 AM   Modules accepted: Orders  

## 2013-04-29 ENCOUNTER — Encounter: Payer: Medicare Other | Attending: Physical Medicine & Rehabilitation

## 2013-04-29 ENCOUNTER — Ambulatory Visit (HOSPITAL_BASED_OUTPATIENT_CLINIC_OR_DEPARTMENT_OTHER): Payer: Medicare Other | Admitting: Physical Medicine & Rehabilitation

## 2013-04-29 ENCOUNTER — Encounter: Payer: Self-pay | Admitting: Physical Medicine & Rehabilitation

## 2013-04-29 VITALS — BP 116/66 | HR 57 | Resp 14 | Ht 65.0 in | Wt 194.4 lb

## 2013-04-29 DIAGNOSIS — M47817 Spondylosis without myelopathy or radiculopathy, lumbosacral region: Secondary | ICD-10-CM

## 2013-04-29 DIAGNOSIS — M549 Dorsalgia, unspecified: Secondary | ICD-10-CM | POA: Insufficient documentation

## 2013-04-29 DIAGNOSIS — M76899 Other specified enthesopathies of unspecified lower limb, excluding foot: Secondary | ICD-10-CM | POA: Insufficient documentation

## 2013-04-29 DIAGNOSIS — M25569 Pain in unspecified knee: Secondary | ICD-10-CM | POA: Insufficient documentation

## 2013-04-29 DIAGNOSIS — Z9089 Acquired absence of other organs: Secondary | ICD-10-CM | POA: Insufficient documentation

## 2013-04-29 DIAGNOSIS — M810 Age-related osteoporosis without current pathological fracture: Secondary | ICD-10-CM | POA: Insufficient documentation

## 2013-04-29 DIAGNOSIS — Z853 Personal history of malignant neoplasm of breast: Secondary | ICD-10-CM | POA: Insufficient documentation

## 2013-04-29 DIAGNOSIS — Z5181 Encounter for therapeutic drug level monitoring: Secondary | ICD-10-CM | POA: Insufficient documentation

## 2013-04-29 DIAGNOSIS — M25559 Pain in unspecified hip: Secondary | ICD-10-CM | POA: Insufficient documentation

## 2013-04-29 DIAGNOSIS — M4120 Other idiopathic scoliosis, site unspecified: Secondary | ICD-10-CM | POA: Insufficient documentation

## 2013-04-29 DIAGNOSIS — K219 Gastro-esophageal reflux disease without esophagitis: Secondary | ICD-10-CM | POA: Insufficient documentation

## 2013-04-29 DIAGNOSIS — M25519 Pain in unspecified shoulder: Secondary | ICD-10-CM | POA: Insufficient documentation

## 2013-04-29 DIAGNOSIS — Z96659 Presence of unspecified artificial knee joint: Secondary | ICD-10-CM | POA: Insufficient documentation

## 2013-04-29 DIAGNOSIS — I10 Essential (primary) hypertension: Secondary | ICD-10-CM | POA: Insufficient documentation

## 2013-04-29 MED ORDER — TRAMADOL HCL 50 MG PO TABS: 50.0000 mg | ORAL_TABLET | Freq: Four times a day (QID) | ORAL | Status: DC | PRN

## 2013-04-29 NOTE — Progress Notes (Signed)

## 2013-04-29 NOTE — Progress Notes (Signed)
  PROCEDURE RECORD [facility]   Name: Evaleigh Mccamy DOB:Feb 14, 1941 MRN: [id number]  Date:04/29/2013  Physician: Claudette Laws, MD    Nurse/CMA: Shumaker RN  Allergies:  Allergies  Allergen Reactions  . Plavix (Clopidogrel Bisulfate) Itching and Other (See Comments)    Causes migraines. Itching all over the body.  . Warfarin And Related Other (See Comments)    Blood thinners - cause migraines in patient.  . Adhesive (Tape) Other (See Comments)    Severe irritation of skin    Consent Signed: yes  Is patient diabetic? no  CBG today?   Pregnant: no LMP: No LMP recorded. Patient is postmenopausal. (age 72-55)  Anticoagulants: yes (81 mg asa) Anti-inflammatory: no Antibiotics: no  Procedure: Bilateral L3-4-5 Medial Branch Blocks Position: Prone Start Time: 11:24 End Time: 11:31 Fluoro Time: 26 seconds  RN/CMA Designer, multimedia    Time 10:56 11:37    BP 116/66 126/49    Pulse 57 68    Respirations 14 14    O2 Sat 96 99    S/S 6 6    Pain Level 8/10      D/C home with daughter, patient A & O X 3, D/C instructions reviewed, and sits independently.

## 2013-04-29 NOTE — Patient Instructions (Signed)

## 2013-05-05 ENCOUNTER — Telehealth: Payer: Self-pay

## 2013-05-05 NOTE — Telephone Encounter (Signed)
ok 

## 2013-05-05 NOTE — Telephone Encounter (Signed)
Patient says she had nucynta a few days earlier.  This is prescribed by Dr Renae Fickle.  Advised patient to bring medications to next appointment.

## 2013-05-05 NOTE — Telephone Encounter (Signed)
Message copied by Judd Gaudier on Mon May 05, 2013  8:24 AM ------      Message from: Su Monks      Created: Wed Apr 30, 2013  2:54 PM       Please ask patient whether she is taking the Nucynta, it is not in our med chart, not mentioned in Dr. Wynn Banker note, also ask her about the diazepam/valium, maybe our med chart is not accurate. ------

## 2013-05-27 ENCOUNTER — Ambulatory Visit: Payer: Medicare Other | Admitting: Physical Medicine & Rehabilitation

## 2013-06-02 ENCOUNTER — Ambulatory Visit: Payer: Self-pay | Admitting: Neurology

## 2013-06-10 ENCOUNTER — Other Ambulatory Visit: Payer: Self-pay | Admitting: Neurology

## 2013-06-10 ENCOUNTER — Ambulatory Visit (INDEPENDENT_AMBULATORY_CARE_PROVIDER_SITE_OTHER): Payer: Medicare Other | Admitting: Neurology

## 2013-06-10 ENCOUNTER — Encounter: Payer: Self-pay | Admitting: Neurology

## 2013-06-10 VITALS — BP 130/76 | HR 58 | Ht 65.5 in | Wt 193.0 lb

## 2013-06-10 DIAGNOSIS — F3289 Other specified depressive episodes: Secondary | ICD-10-CM

## 2013-06-10 DIAGNOSIS — G243 Spasmodic torticollis: Secondary | ICD-10-CM | POA: Insufficient documentation

## 2013-06-10 DIAGNOSIS — R51 Headache: Secondary | ICD-10-CM

## 2013-06-10 DIAGNOSIS — F32A Depression, unspecified: Secondary | ICD-10-CM

## 2013-06-10 DIAGNOSIS — R519 Headache, unspecified: Secondary | ICD-10-CM | POA: Insufficient documentation

## 2013-06-10 DIAGNOSIS — M316 Other giant cell arteritis: Secondary | ICD-10-CM

## 2013-06-10 DIAGNOSIS — F329 Major depressive disorder, single episode, unspecified: Secondary | ICD-10-CM

## 2013-06-10 NOTE — Progress Notes (Signed)
Ashley Hanson is a 72y/o woman with a history of chronic pain, dementia and depression returns today for a follow up visit. Her last visit was 12/20/2012 with Dr Sandria Manly, at which time she was started on Aricept 5mg  daily for cognitive decline.  Returns today reporting doing well overall. Main concern is a R temporal pressure type headache that comes and goes. She does note some intermittent blurry vision associated with this headache but notes chronic blurry vision due to hx of cataracts and glaucoma. No worsening of headache pain with chewing or fatigue with chewing. Notes that cool air causes a tearing up of the R eye. She feels that the headache is related to a fall over 2 years ago, though reports it is increasing in frequency lately.   Otherwise doing well. Tolerating Aricept well though continues to have periods of "fog". Is currently on tramadol, Mobic and Neurontin. Back pain is stable and is not currently bothering her. Scheduled to see pain clinic next month and plans to taper off tramadol. Depression is stable. Had Lexapro increased to 20mg  daily.  Neck pain is stable. Has had cervical botox injections in the past. Does not feel the need for them at this point.   Constitutional: Denies fever, weight loss, weight gain Eyes: Denies blurry vision, loss of vision, eye pain CV: Denies chest pain, palpitations, syncope Pulm: Denies SOB, dyspnea, cough GI:  Denies constipation, diarrhea, abdominal pain MSK: Denies spasms, muscle pain, weakness Neuro: + HA, vertigo, falls, tremor Psyc:  Denies depression, hallucinatons, confusion Hem/lymph: Denies easy bleeding, bruising, no swollen nodes Allergic: No runny nose, hives, rashes  All other ROS are negative    Gen: NAD, conversant Eyes: anicteric sclerae, moist conjunctivae HENT: Atraumati, tenderness to palpation in R temporal region Neck: Trachea midline; supple,  Lungs: CTA, no wheezing, rales, rhonic                          CV:  RRR, no MRG Abdomen: Soft, non-tender;  Extremities: No peripheral edema  Skin: Normal temperature, no rash,  Psych: Appropriate affect, pleasant   Ashley: MMSE 30/30 compared to 23/30 at prior visit  Attention: WORLD backwards  Speech: fluent w/o paraphasic error  Memory: good recent and remote recall  CN: PERRL, EOMI no nystagmus, VFF to FC, mild R ptosis, sensation intact to LT V1-V3 bilat, face symmetric, no weakness, hearing grossly intact, palate elevates symmetrically, shoulder shrug 5/5 bilat,  tongue protrudes midline, no fasiculations noted.  Motor: normal bulk and tone Strength: 5/5  In all extremities  Coord: rapid alternating and point-to-point (FNF, HTS) movements intact.  Reflexes: symmetrical, bilat downgoing toes  Sens: LT intact in all extremities, decreased proprioception LLE  Gait: antalgic gait, uses a cane, left foot turns in when she walks  A/P:  Ashley Ost is a 72y/o woman followed in neurology clinic for chronic neck/back pain, cognitive decline and depression presenting for follow up with main concern being headache. HA described as R temporal pain/tenderness with ? Blurry vision in her R eye. Physical exam appears otherwise stable.   1) Headache -suspect symptoms may partly be due to medication overuse/rebound as patient taking tramadol, oltram and neurontin. Based on patients age and clinical hx/exam need to rule temporal arteritis -patient sent to lab for ESR and CRP, will follow up, steroid and temporal artery biopsy as needed -instructed patient on potential benefits of tapering off pain medications (tramadol and mobic)., will slowly titrate up neurontin for symptomatic  relief, plan to increase to 60mg  tid.   2) Depression -clinically improved on increased dose of lexapro, continue lexaprol 20mg  daily  3)Cognitive decline -MMSE showed marked improvement on Aripcet 5mg  daily -question if depression may have underlying role in cognitive  decline -periods of "fog" will likely improve with tapering down of pain medications  Follow up in 6 months  A total of 30 minutes was spent in with this patient. Over half this time was spent on counseling patient on the diagnosis and different therapeutic options available.  We discussed different etiologies of her headache and how overuse of pain medications can cause worsening of her headache symptoms. All questions were answered.

## 2013-06-10 NOTE — Patient Instructions (Addendum)
Overall you are doing fairly well but I do want to suggest a few things today:   Remember to drink plenty of fluid, eat healthy meals and do not skip any meals. Try to eat protein with a every meal and eat a healthy snack such as fruit or nuts in between meals. Try to keep a regular sleep-wake schedule and try to exercise daily, particularly in the form of walking, 20-30 minutes a day, if you can.   As far as your medications are concerned, I would like to suggest that you discuss talking with your pain doctor about coming off the tramadol. We will increase your gabapentin to help relieve your headache symptoms. Start by adding one additional 300mg  tablet so you are taking 300mg  in the morning, 600mg  in the afternoon and 600mg  in the evening. If this does not improve your headache then consider increasing to 600mg  three times a day.   You can consider trying melatonin 5mg  nightly to help you sleep better at night. This is available over the counter at your local drug store.  I would like to see you back in 4 to 6 months, sooner if we need to. Please call us with any interim questions, concerns, problems, updates or refill requests.   We will check your ESR and Sedimentation rate to rule out temporal arteritis. Please have these tests done today before leaving the lab.  Please also call us for any test results so we can go over those with you on the phone.  My clinical assistant and will answer any of your questions and relay your messages to me and also relay most of my messages to you.   Our phone number is [phone]. We also have an after hours call service for urgent matters and there is a physician on-call for urgent questions. For any emergencies you know to call 911 or go to the nearest emergency room

## 2013-06-11 LAB — C-REACTIVE PROTEIN: CRP: 9.3 mg/L — ABNORMAL HIGH (ref 0.0–4.9)

## 2013-06-11 LAB — SEDIMENTATION RATE: Sed Rate: 22 mm/hr (ref 0–40)

## 2013-06-20 ENCOUNTER — Encounter: Payer: Self-pay | Admitting: Neurology

## 2013-07-03 ENCOUNTER — Encounter: Payer: Medicare Other | Attending: Physical Medicine & Rehabilitation

## 2013-07-03 ENCOUNTER — Ambulatory Visit (HOSPITAL_BASED_OUTPATIENT_CLINIC_OR_DEPARTMENT_OTHER): Payer: Medicare Other | Admitting: Physical Medicine & Rehabilitation

## 2013-07-03 ENCOUNTER — Encounter: Payer: Self-pay | Admitting: Physical Medicine & Rehabilitation

## 2013-07-03 VITALS — BP 114/61 | HR 60 | Resp 14 | Ht 65.0 in | Wt 190.6 lb

## 2013-07-03 DIAGNOSIS — G8929 Other chronic pain: Secondary | ICD-10-CM

## 2013-07-03 DIAGNOSIS — M47817 Spondylosis without myelopathy or radiculopathy, lumbosacral region: Secondary | ICD-10-CM | POA: Insufficient documentation

## 2013-07-03 DIAGNOSIS — M76899 Other specified enthesopathies of unspecified lower limb, excluding foot: Secondary | ICD-10-CM | POA: Insufficient documentation

## 2013-07-03 DIAGNOSIS — M4120 Other idiopathic scoliosis, site unspecified: Secondary | ICD-10-CM

## 2013-07-03 DIAGNOSIS — M549 Dorsalgia, unspecified: Secondary | ICD-10-CM | POA: Insufficient documentation

## 2013-07-03 DIAGNOSIS — M25519 Pain in unspecified shoulder: Secondary | ICD-10-CM | POA: Insufficient documentation

## 2013-07-03 DIAGNOSIS — Z5181 Encounter for therapeutic drug level monitoring: Secondary | ICD-10-CM | POA: Insufficient documentation

## 2013-07-03 DIAGNOSIS — M810 Age-related osteoporosis without current pathological fracture: Secondary | ICD-10-CM | POA: Insufficient documentation

## 2013-07-03 DIAGNOSIS — I10 Essential (primary) hypertension: Secondary | ICD-10-CM | POA: Insufficient documentation

## 2013-07-03 DIAGNOSIS — Z853 Personal history of malignant neoplasm of breast: Secondary | ICD-10-CM | POA: Insufficient documentation

## 2013-07-03 DIAGNOSIS — Z96659 Presence of unspecified artificial knee joint: Secondary | ICD-10-CM | POA: Insufficient documentation

## 2013-07-03 DIAGNOSIS — Z9089 Acquired absence of other organs: Secondary | ICD-10-CM | POA: Insufficient documentation

## 2013-07-03 DIAGNOSIS — K219 Gastro-esophageal reflux disease without esophagitis: Secondary | ICD-10-CM | POA: Insufficient documentation

## 2013-07-03 DIAGNOSIS — M25559 Pain in unspecified hip: Secondary | ICD-10-CM | POA: Insufficient documentation

## 2013-07-03 DIAGNOSIS — M25569 Pain in unspecified knee: Secondary | ICD-10-CM | POA: Insufficient documentation

## 2013-07-03 DIAGNOSIS — M419 Scoliosis, unspecified: Secondary | ICD-10-CM

## 2013-07-03 MED ORDER — TAPENTADOL HCL 50 MG PO TABS: 50.0000 mg | ORAL_TABLET | Freq: Four times a day (QID) | ORAL | Status: DC | PRN

## 2013-07-03 NOTE — Progress Notes (Signed)
Bilateral T 12, L1, L2 medial branch blocks under fluoroscopic guidance  Indication: Lumbar pain which is not relieved by medication management or other conservative care and interfering with self-care and mobility.  Informed consent was obtained after describing risks and benefits of the procedure with the patient, this includes bleeding, bruising, infection, paralysis and medication side effects. The patient wishes to proceed and has given written consent. The patient was placed in a prone position. The lumbar area was marked and prepped with Betadine. One ML of 1% lidocaine was injected into each of 6 areas into the skin and subcutaneous tissue. Then a 22-gauge 3.5 inch spinal needle was inserted targeting the junction of the left L3 superior articular process /transverse process junction. Needle was advanced under fluoroscopic guidance. Bone contact was made. Omnipaque 180 was injected x0.5 mL demonstrating no intravascular uptake. Then a solution containing one ML of 4 mg per mL dexamethasone and 3 mL of 2% MPF lidocaine was injected x0.5 mL. Then the left L2 superior articular process in transverse process junction was targeted. Bone contact was made. Omnipaque 180 was injected x0.5 mL demonstrating no intravascular uptake. Then a solution containing one ML of 4 mg per mL dexamethasone and 3 mL of 2% MPF lidocaine was injected x0.5 mL. Then the left L1 superior articular process in transverse process junction was targeted. Bone contact was made. Omnipaque 180 was injected x0.5 mL demonstrating no intravascular uptake. Then a solution containing one ML of 4 mg per mL dexamethasone and 3 mL of 2% MPF lidocaine was injected x0.5 mL. this same procedure was performed on the right side using the same needle, technique, and injectate. Patient tolerated procedure well. Post procedure instructions were given. Please refer to post procedure form.  We also discussed pain medications. Initial urine drug screen was  performed but did not show Nucynta. Patient states that this was because she only took it on a as needed basis. Will repeat UDS. Given a prescription for Nucynta. Discussed potential interaction with Lexapro. Return in one month

## 2013-07-03 NOTE — Progress Notes (Signed)
  PROCEDURE RECORD [facility]   Name: Ashley Hanson DOB:01-27-1941 MRN: [id number]  Date:07/03/2013  Physician: Claudette Laws, MD    Nurse/CMA: Shumaker RN  Allergies:  Allergies  Allergen Reactions  . Plavix [Clopidogrel Bisulfate] Itching and Other (See Comments)    Causes migraines. Itching all over the body.  . Warfarin And Related Other (See Comments)    Blood thinners - cause migraines in patient.  . Adhesive [Tape] Other (See Comments)    Severe irritation of skin    Consent Signed: no  Is patient diabetic? no  CBG today?   Pregnant: no LMP: No LMP recorded. Patient is postmenopausal. (age 36-55)  Anticoagulants: no Anti-inflammatory: no Antibiotics: no  Procedure: Bilateral medial branch block Position: Prone Start Time: 1:11 End Time: 1:22 Fluoro Time: 37 seconds  RN/CMA Ashley Hanson CMA Shumaker RN    Time 11:59 1:29    BP 114/61 143/59    Pulse 60 56    Respirations 14 14    O2 Sat 97 98    S/S 6 6    Pain Level 7/10 7/10     D/C home with Celest, patient A & O X 3, D/C instructions reviewed, and sits independently.

## 2013-07-03 NOTE — Patient Instructions (Addendum)

## 2013-07-09 ENCOUNTER — Other Ambulatory Visit: Payer: Self-pay | Admitting: Neurology

## 2013-07-12 ENCOUNTER — Other Ambulatory Visit: Payer: Self-pay

## 2013-07-12 MED ORDER — DONEPEZIL HCL 5 MG PO TABS: 5.0000 mg | ORAL_TABLET | Freq: Every day | ORAL | Status: DC

## 2013-07-31 ENCOUNTER — Ambulatory Visit: Payer: Medicare Other | Admitting: Physical Medicine & Rehabilitation

## 2013-08-15 ENCOUNTER — Ambulatory Visit (HOSPITAL_BASED_OUTPATIENT_CLINIC_OR_DEPARTMENT_OTHER): Payer: Medicare Other | Admitting: Physical Medicine & Rehabilitation

## 2013-08-15 ENCOUNTER — Encounter: Payer: Self-pay | Admitting: Physical Medicine & Rehabilitation

## 2013-08-15 ENCOUNTER — Encounter: Payer: Medicare Other | Attending: Physical Medicine & Rehabilitation

## 2013-08-15 VITALS — BP 128/67 | HR 58 | Resp 16 | Ht 65.0 in | Wt 190.0 lb

## 2013-08-15 DIAGNOSIS — Z853 Personal history of malignant neoplasm of breast: Secondary | ICD-10-CM | POA: Insufficient documentation

## 2013-08-15 DIAGNOSIS — M419 Scoliosis, unspecified: Secondary | ICD-10-CM

## 2013-08-15 DIAGNOSIS — M4120 Other idiopathic scoliosis, site unspecified: Secondary | ICD-10-CM | POA: Insufficient documentation

## 2013-08-15 DIAGNOSIS — Z9089 Acquired absence of other organs: Secondary | ICD-10-CM | POA: Insufficient documentation

## 2013-08-15 DIAGNOSIS — M47817 Spondylosis without myelopathy or radiculopathy, lumbosacral region: Secondary | ICD-10-CM | POA: Insufficient documentation

## 2013-08-15 DIAGNOSIS — Z5181 Encounter for therapeutic drug level monitoring: Secondary | ICD-10-CM | POA: Insufficient documentation

## 2013-08-15 DIAGNOSIS — M25519 Pain in unspecified shoulder: Secondary | ICD-10-CM | POA: Insufficient documentation

## 2013-08-15 DIAGNOSIS — M25569 Pain in unspecified knee: Secondary | ICD-10-CM | POA: Insufficient documentation

## 2013-08-15 DIAGNOSIS — M810 Age-related osteoporosis without current pathological fracture: Secondary | ICD-10-CM | POA: Insufficient documentation

## 2013-08-15 DIAGNOSIS — K219 Gastro-esophageal reflux disease without esophagitis: Secondary | ICD-10-CM | POA: Insufficient documentation

## 2013-08-15 DIAGNOSIS — Z96659 Presence of unspecified artificial knee joint: Secondary | ICD-10-CM | POA: Insufficient documentation

## 2013-08-15 DIAGNOSIS — M549 Dorsalgia, unspecified: Secondary | ICD-10-CM | POA: Insufficient documentation

## 2013-08-15 DIAGNOSIS — M76899 Other specified enthesopathies of unspecified lower limb, excluding foot: Secondary | ICD-10-CM | POA: Insufficient documentation

## 2013-08-15 DIAGNOSIS — M25559 Pain in unspecified hip: Secondary | ICD-10-CM | POA: Insufficient documentation

## 2013-08-15 DIAGNOSIS — I10 Essential (primary) hypertension: Secondary | ICD-10-CM | POA: Insufficient documentation

## 2013-08-15 NOTE — Progress Notes (Signed)
Subjective:    Patient ID: Ashley Hanson, female    DOB: 03-Jul-1941, 72 y.o.   MRN: [id number]  HPI Bilateral T 12, L1, L2 medial branch blocks under fluoroscopic guidance August 14,2014 Very good results Filled prescription for Nucynta 50 mg on 07/16/2013. #120. Has not use any of them because of good pain relief Pain Inventory Average Pain 0 Pain Right Now 0 My pain is n/a  In the last 24 hours, has pain interfered with the following? General activity 0 Relation with others 0 Enjoyment of life 0 What TIME of day is your pain at its worst? n/a Sleep (in general) Fair  Pain is worse with: n/a Pain improves with: n/a Relief from Meds: injections  Mobility use a cane ability to climb steps?  no do you drive?  yes  Function retired  Neuro/Psych bowel control problems tingling depression anxiety  Prior Studies Any changes since last visit?  no  Physicians involved in your care Any changes since last visit?  no   Family History  Problem Relation Age of Onset  . Colon polyps Mother    History   Social History  . Marital Status: Widowed    Spouse Name: N/A    Number of Children: 2  . Years of Education: 12   Occupational History  . Sales     Retired   Social History Main Topics  . Smoking status: Never Smoker   . Smokeless tobacco: Never Used  . Alcohol Use: No  . Drug Use: No  . Sexual Activity: None   Other Topics Concern  . None   Social History Narrative   Patient lives at home with her father and he is blind. Patient is retired. Patient is widowed and has two children.   Right handed.   Caffeine- None   Past Surgical History  Procedure Laterality Date  . Tubal ligation    . Esophagogastroduodenoscopy  08/30/11  . Colonoscopy  08/30/11  . Knee surgery    . Dilation and curettage of uterus    . Ankle surgery    . Skin graft    . Shoulder surgery  06/2011  . Wrist surgery  1995  . Joint replacement      knee - right  . Breast surgery       x 4  . Cardiovascular stress test    . Doppler echocardiography      hx   . Keloid excision      from bilateral earlobes  . Laparoscopic cholecystectomy w/ cholangiography  10/04/2012  . Ercp  10/05/2012    Procedure: ENDOSCOPIC RETROGRADE CHOLANGIOPANCREATOGRAPHY (ERCP);  Surgeon: Petra Kuba, MD;  Location: [facility] ENDOSCOPY;  Service: Endoscopy;  Laterality: N/A;  . Cholecystectomy  10/04/2012    Procedure: LAPAROSCOPIC CHOLECYSTECTOMY WITH INTRAOPERATIVE CHOLANGIOGRAM;  Surgeon: Ernestene Mention, MD;  Location: [facility];  Service: General;  Laterality: N/A;  laparosopic cholecystectomy with cholangiogram and repair of umbilical hernia   Past Medical History  Diagnosis Date  . Gallstones   . Esophageal reflux   . Osteoporosis   . Cancer     breast cancer  . Migraines   . Abdominal pain   . Nausea   . Confusion   . Depression   . Dementia     early stages per patient  . Irritable bowel   . Diverticulosis   . Hypertension     sees Dr. Dorma Russell J.Green  . Bronchitis   . Dementia     "early stages  per patient" and long term memory loss  . Carpal tunnel syndrome     bilaterally  . Arthritis   . Cataracts, bilateral   . Complication of anesthesia     "difficutly waking up and hypothermia and stopped breathing"  . PONV (postoperative nausea and vomiting)   . Anginal pain     sees Dr Alanda Amass; reportedly negative stress/echo 2012  . Refusal of blood transfusions as patient is Jehovah's Witness    BP 128/67  Pulse 58  Resp 16  Ht 5\' 5"  (1.651 m)  Wt 190 lb (86.183 kg)  BMI 31.62 kg/m2  SpO2 97%     Review of Systems  Constitutional: Positive for unexpected weight change.  Gastrointestinal: Positive for diarrhea and constipation.  Musculoskeletal: Positive for back pain.  Psychiatric/Behavioral: Positive for dysphoric mood. The patient is nervous/anxious.   All other systems reviewed and are negative.       Objective:   Physical Exam  Nursing note and vitals  reviewed. Constitutional: She appears well-developed and well-nourished.  HENT:  Head: Normocephalic and atraumatic.  Eyes: Conjunctivae and EOM are normal. Pupils are equal, round, and reactive to light.  Psychiatric: She has a normal mood and affect.          Assessment & Plan:  1. Lumbar spondylosis L1-2 L2-3 levels. Excellent results with bilateral T12 L1-L2 medial branch blocks. As we discussed it's difficult to predict how long these blocks will last. Patient will call me to schedule another set of injections once pain starts returning. In the meantime she could restart the Nucynta as needed

## 2013-08-19 ENCOUNTER — Encounter: Payer: Self-pay | Admitting: Cardiovascular Disease

## 2013-10-14 ENCOUNTER — Other Ambulatory Visit: Payer: Self-pay | Admitting: Internal Medicine

## 2013-10-14 DIAGNOSIS — Z1231 Encounter for screening mammogram for malignant neoplasm of breast: Secondary | ICD-10-CM

## 2013-10-27 ENCOUNTER — Other Ambulatory Visit: Payer: Self-pay | Admitting: Internal Medicine

## 2013-10-27 DIAGNOSIS — N63 Unspecified lump in unspecified breast: Secondary | ICD-10-CM

## 2013-11-05 ENCOUNTER — Ambulatory Visit
Admission: RE | Admit: 2013-11-05 | Discharge: 2013-11-05 | Disposition: A | Discharge status: Home / Self Care | Payer: Medicare Other | Source: Ambulatory Visit | Attending: Internal Medicine | Admitting: Internal Medicine

## 2013-11-05 DIAGNOSIS — N63 Unspecified lump in unspecified breast: Secondary | ICD-10-CM

## 2013-11-10 ENCOUNTER — Ambulatory Visit: Payer: Medicare Other | Admitting: Neurology

## 2013-11-24 ENCOUNTER — Ambulatory Visit (INDEPENDENT_AMBULATORY_CARE_PROVIDER_SITE_OTHER): Payer: Medicare Other | Admitting: Neurology

## 2013-11-24 ENCOUNTER — Encounter: Payer: Self-pay | Admitting: Neurology

## 2013-11-24 VITALS — BP 108/66 | HR 60 | Ht 66.0 in | Wt 198.0 lb

## 2013-11-24 DIAGNOSIS — M545 Low back pain, unspecified: Secondary | ICD-10-CM

## 2013-11-24 NOTE — Patient Instructions (Signed)
Overall you are doing fairly well but I do want to suggest a few things today:   Remember to drink plenty of fluid, eat healthy meals and do not skip any meals. Try to eat protein with a every meal and eat a healthy snack such as fruit or nuts in between meals. Try to keep a regular sleep-wake schedule and try to exercise daily, particularly in the form of walking, 20-30 minutes a day, if you can.   We will not make any medication changes at this time  I would like you work with physical therapy. They will call you to schedule this appoinment  I would like to see you back in 6 months, sooner if we need to. Please call us with any interim questions, concerns, problems, updates or refill requests.   My clinical assistant and will answer any of your questions and relay your messages to me and also relay most of my messages to you.   Our phone number is [phone]. We also have an after hours call service for urgent matters and there is a physician on-call for urgent questions. For any emergencies you know to call 911 or go to the nearest emergency room

## 2013-11-24 NOTE — Progress Notes (Signed)
[facility]   Provider:  Dr Hosie Poisson Referring Provider: Enrique Sack, MD Primary Care Physician:  Enrique Sack, MD  CC:  Follow up  HPI:  Ashley Hanson is a 73 y.o. female here as a follow up with last visit being 05/2013. At that time she was doing well overall. Was having a headache and her gabapentin was titrated up. Since then she continues to have the headaches. Feels that the Gabapentin helps reduce the headaches. Has come off the Aricept and Lexapro, feels this has helped decrease the severity of her dreams but has made her more irritable. Continues to have severe lower back pain. Has been receiving injections for her lower back pain. Cervical neck pain is absent. Continues to have generalized joint and muscle pains. Reports she has not done physical therapy in years.    Prior visit 05/2013: Returns today reporting doing well overall. Main concern is a R temporal pressure type headache that comes and goes. She does note some intermittent blurry vision associated with this headache but notes chronic blurry vision due to hx of cataracts and glaucoma. No worsening of headache pain with chewing or fatigue with chewing. Notes that cool air causes a tearing up of the R eye. She feels that the headache is related to a fall over 2 years ago, though reports it is increasing in frequency lately.  Otherwise doing well. Tolerating Aricept well though continues to have periods of "fog". Is currently on tramadol, Mobic and Neurontin. Back pain is stable and is not currently bothering her. Scheduled to see pain clinic next month and plans to taper off tramadol. Depression is stable. Had Lexapro increased to 20mg  daily.  Neck pain is stable. Has had cervical botox injections in the past. Does not feel the need for them at this point.    Concerns/Questions:Review of Systems: Out of a complete 14 system review, the patient complains of only the following symptoms, and all other  reviewed systems are negative. + headache, back pain, generalized muscle pain  History   Social History  . Marital Status: Widowed    Spouse Name: N/A    Number of Children: 2  . Years of Education: 12   Occupational History  . Sales     Retired   Social History Main Topics  . Smoking status: Never Smoker   . Smokeless tobacco: Never Used  . Alcohol Use: No  . Drug Use: No  . Sexual Activity: Not on file   Other Topics Concern  . Not on file   Social History Narrative   Patient lives at home with her father and he is blind. Patient is retired. Patient is widowed and has two children.   Right handed.   Caffeine- None    Family History  Problem Relation Age of Onset  . Colon polyps Mother     Past Medical History  Diagnosis Date  . Gallstones   . Esophageal reflux   . Osteoporosis   . Cancer     breast cancer  . Migraines   . Abdominal pain   . Nausea   . Confusion   . Depression   . Dementia     early stages per patient  . Irritable bowel   . Diverticulosis   . Hypertension     sees Dr. Dorma Russell J.Green  . Bronchitis   . Dementia     "early stages per patient" and long term memory loss  . Carpal tunnel syndrome  bilaterally  . Arthritis   . Cataracts, bilateral   . Complication of anesthesia     "difficutly waking up and hypothermia and stopped breathing"  . PONV (postoperative nausea and vomiting)   . Anginal pain     sees [name]; reportedly negative stress/echo 2012  . Refusal of blood transfusions as patient is Jehovah's Witness     Past Surgical History  Procedure Laterality Date  . Tubal ligation    . Esophagogastroduodenoscopy  08/30/11  . Colonoscopy  08/30/11  . Knee surgery    . Dilation and curettage of uterus    . Ankle surgery    . Skin graft    . Shoulder surgery  06/2011  . Wrist surgery  1995  . Joint replacement      knee - right  . Breast surgery      x 4  . Cardiovascular stress test    . Doppler  echocardiography      hx   . Keloid excision      from bilateral earlobes  . Laparoscopic cholecystectomy w/ cholangiography  10/04/2012  . Ercp  10/05/2012    Procedure: ENDOSCOPIC RETROGRADE CHOLANGIOPANCREATOGRAPHY (ERCP);  Surgeon: Jeryl Columbia, MD;  Location: [facility] ENDOSCOPY;  Service: Endoscopy;  Laterality: N/A;  . Cholecystectomy  10/04/2012    Procedure: LAPAROSCOPIC CHOLECYSTECTOMY WITH INTRAOPERATIVE CHOLANGIOGRAM;  Surgeon: Adin Hector, MD;  Location: [place];  Service: General;  Laterality: N/A;  laparosopic cholecystectomy with cholangiogram and repair of umbilical hernia    Current Outpatient Prescriptions  Medication Sig Dispense Refill  . Aspirin 81 MG EC tablet Take 81 mg by mouth daily.      . Calcium Carbonate-Vit D-Min (CALCIUM 1200 PO) Take 1 tablet by mouth 2 (two) times daily.      . Cholecalciferol (VITAMIN D-3 PO) Take 1 tablet by mouth daily.      . clorazepate (TRANXENE) 3.75 MG tablet Take 3.75 mg by mouth 3 (three) times daily.      Marland Kitchen dexlansoprazole (DEXILANT) 60 MG capsule Take 60 mg by mouth daily.      Marland Kitchen diltiazem (DILACOR XR) 120 MG 24 hr capsule Take 120 mg by mouth daily.      Marland Kitchen gabapentin (NEURONTIN) 600 MG tablet Take 600 mg by mouth 2 (two) times daily.       . hydrochlorothiazide (HYDRODIURIL) 25 MG tablet Take 25 mg by mouth daily.      Marland Kitchen loperamide (IMODIUM A-D) 2 MG tablet Take 2 mg by mouth 4 (four) times daily as needed. For diarrhea      . losartan (COZAAR) 50 MG tablet Take 50 mg by mouth daily.      . potassium chloride (K-DUR,KLOR-CON) 10 MEQ tablet Take 10 mEq by mouth daily.      . propranolol (INDERAL) 80 MG tablet Take 80 mg by mouth 2 (two) times daily.      . tapentadol (NUCYNTA) 50 MG TABS tablet Take 1 tablet (50 mg total) by mouth every 6 (six) hours as needed.  120 tablet  0  . venlafaxine (EFFEXOR) 37.5 MG tablet 1 tablet daily.       No current facility-administered medications for this visit.    Allergies as of 11/24/2013 -  Review Complete 11/24/2013  Allergen Reaction Noted  . Plavix [clopidogrel bisulfate] Itching and Other (See Comments) 09/06/2012  . Warfarin and related Other (See Comments) 09/03/2012  . Adhesive [tape] Other (See Comments) 09/20/2012    Vitals: BP 108/66  Pulse  60  Ht 5\' 6"  (1.676 m)  Wt 198 lb (89.812 kg)  BMI 31.97 kg/m2 Last Weight:  Wt Readings from Last 1 Encounters:  11/24/13 198 lb (89.812 kg)   Last Height:   Ht Readings from Last 1 Encounters:  11/24/13 5\' 6"  (1.676 m)     Physical exam: Gen: NAD, conversant  Eyes: anicteric sclerae, moist conjunctivae  HENT: Atraumati, tenderness to palpation in R temporal region  Neck: Trachea midline; supple,  Lungs: CTA, no wheezing, rales, rhonic  CV: RRR, no MRG  Abdomen: Soft, non-tender;  Extremities: No peripheral edema  Skin: Normal temperature, no rash,  Psych: Appropriate affect, pleasant  Ashley: MMSE 29/30 compared to 30/30 at last visit  Attention: WORLD backwards  Speech: fluent w/o paraphasic error   Memory: good recent and remote recall   CN:  PERRL, EOMI no nystagmus, VFF to FC, mild R ptosis, sensation intact to LT V1-V3 bilat, face symmetric, no weakness, hearing grossly intact, palate elevates symmetrically, shoulder shrug 5/5 bilat,  tongue protrudes midline, no fasiculations noted.  Motor: normal bulk and tone  Strength:  5/5 In all extremities  Coord: rapid alternating and point-to-point (FNF, HTS) movements intact.  Reflexes: symmetrical, bilat downgoing toes  Sens: LT intact in all extremities, decreased proprioception LLE  Gait: antalgic gait, uses a cane    Assessment:  After physical and neurologic examination, review of laboratory studies, imaging, neurophysiology testing and pre-existing records, assessment will be reviewed on the problem list.  Plan:  Treatment plan and additional workup will be reviewed under Problem List.  1)Chronic lumbar back pain 2)Headache 3)Cervical  dystonia: stable  Ashley Hanson is a 73y/o woman followed in neurology clinic for chronic neck/back pain, cognitive decline and depression presenting for follow up with main concern being chronic back and MSK pain. She has d/c her aricept and lexapro since last visit. Memory remains good with minimal (1pt) drop in MMSE. Has been receiving lumbar cortisol shots for back pain, states she gets some relief but the cost is prohibitive. We discussed different therapeutic options, including increasing gabapentin vs switching to Lyrica vs a course of Physical therapy. She decided on course of PT, referral was placed. Will continue on Gabapentin 600mg  BID for now. Follow up in 6  Months or earlier as needed.      Jim Like, DO  [facility] [address] [place], [place] [address]  Phone [phone] Fax [phone]

## 2014-06-01 ENCOUNTER — Ambulatory Visit: Payer: Medicare Other | Admitting: Neurology

## 2014-06-08 ENCOUNTER — Ambulatory Visit: Payer: Self-pay | Admitting: Neurology

## 2014-07-09 ENCOUNTER — Ambulatory Visit (INDEPENDENT_AMBULATORY_CARE_PROVIDER_SITE_OTHER): Payer: Medicare Other | Admitting: Neurology

## 2014-07-09 ENCOUNTER — Encounter: Payer: Self-pay | Admitting: Neurology

## 2014-07-09 VITALS — BP 110/67 | HR 67 | Ht 66.0 in | Wt 190.0 lb

## 2014-07-09 DIAGNOSIS — F09 Unspecified mental disorder due to known physiological condition: Secondary | ICD-10-CM

## 2014-07-09 DIAGNOSIS — R4189 Other symptoms and signs involving cognitive functions and awareness: Secondary | ICD-10-CM

## 2014-07-09 MED ORDER — VENLAFAXINE HCL ER 75 MG PO CP24: 75.0000 mg | ORAL_CAPSULE | Freq: Every day | ORAL | Status: DC

## 2014-07-09 NOTE — Progress Notes (Signed)
[facility]   Provider:  Dr Janann Colonel Referring Provider: Criselda Peaches, MD Primary Care Physician:  Criselda Peaches, MD  CC:  Follow up  HPI:  Ashley Hanson is a 73 y.o. female here as a follow up with last visit being 11/2013. At that time she was doing well overall. Her main concern today is difficulty with memory and worsening depression. She notes difficulty with short term memory, has trouble recalling what she is doing on a day to day basis. Feels mood is getting worse, states her father is not doing well health wise and this has caused a lot of stress for her. Notes continued lower back pain. No other acute concerns at this time.     Initial visit 05/2013: Prior Dr. Erling Cruz patient. Returns today reporting doing well overall. Main concern is a R temporal pressure type headache that comes and goes. She does note some intermittent blurry vision associated with this headache but notes chronic blurry vision due to hx of cataracts and glaucoma. No worsening of headache pain with chewing or fatigue with chewing. Notes that cool air causes a tearing up of the R eye. She feels that the headache is related to a fall over 2 years ago, though reports it is increasing in frequency lately.  Otherwise doing well. Tolerating Aricept well though continues to have periods of "fog". Is currently on tramadol, Mobic and Neurontin. Back pain is stable and is not currently bothering her. Scheduled to see pain clinic next month and plans to taper off tramadol. Depression is stable. Had Lexapro increased to 20mg  daily.  Neck pain is stable. Has had cervical botox injections in the past. Does not feel the need for them at this point.    Concerns/Questions:Review of Systems: Out of a complete 14 system review, the patient complains of only the following symptoms, and all other reviewed systems are negative. + headache, back pain, generalized muscle pain  History   Social History  . Marital  Status: Widowed    Spouse Name: N/A    Number of Children: 2  . Years of Education: 12   Occupational History  . Sales     Retired   Social History Main Topics  . Smoking status: Never Smoker   . Smokeless tobacco: Never Used  . Alcohol Use: No  . Drug Use: No  . Sexual Activity: Not on file   Other Topics Concern  . Not on file   Social History Narrative   Patient lives at home with her father and he is blind. Patient is retired. Patient is widowed and has two children.   Right handed.   Caffeine- None    Family History  Problem Relation Age of Onset  . Colon polyps Mother     Past Medical History  Diagnosis Date  . Gallstones   . Esophageal reflux   . Osteoporosis   . Cancer     breast cancer  . Migraines   . Abdominal pain   . Nausea   . Confusion   . Depression   . Dementia     early stages per patient  . Irritable bowel   . Diverticulosis   . Hypertension     sees [name]  . Bronchitis   . Dementia     "early stages per patient" and long term memory loss  . Carpal tunnel syndrome     bilaterally  . Arthritis   . Cataracts, bilateral   . Complication of  anesthesia     "difficutly waking up and hypothermia and stopped breathing"  . PONV (postoperative nausea and vomiting)   . Anginal pain     sees [name]; reportedly negative stress/echo 2012  . Refusal of blood transfusions as patient is Jehovah's Witness     Past Surgical History  Procedure Laterality Date  . Tubal ligation    . Esophagogastroduodenoscopy  08/30/11  . Colonoscopy  08/30/11  . Knee surgery    . Dilation and curettage of uterus    . Ankle surgery    . Skin graft    . Shoulder surgery  06/2011  . Wrist surgery  1995  . Joint replacement      knee - right  . Breast surgery      x 4  . Cardiovascular stress test    . Doppler echocardiography      hx   . Keloid excision      from bilateral earlobes  . Laparoscopic cholecystectomy w/ cholangiography   10/04/2012  . Ercp  10/05/2012    Procedure: ENDOSCOPIC RETROGRADE CHOLANGIOPANCREATOGRAPHY (ERCP);  Surgeon: Jeryl Columbia, MD;  Location[facility] ENDOSCOPY;  Service: Endoscopy;  Laterality: N/A;  . Cholecystectomy  10/04/2012    Procedure: LAPAROSCOPIC CHOLECYSTECTOMY WITH INTRAOPERATIVE CHOLANGIOGRAM;  Surgeon: Adin Hector, MD;  Location: [place];  Service: General;  Laterality: N/A;  laparosopic cholecystectomy with cholangiogram and repair of umbilical hernia    Current Outpatient Prescriptions  Medication Sig Dispense Refill  . Aspirin 81 MG EC tablet Take 81 mg by mouth daily.      . Calcium Carbonate-Vit D-Min (CALCIUM 1200 PO) Take 1 tablet by mouth 2 (two) times daily.      . Cholecalciferol (VITAMIN D-3 PO) Take 1 tablet by mouth daily.      . clorazepate (TRANXENE) 3.75 MG tablet Take 3.75 mg by mouth 3 (three) times daily.      Marland Kitchen dexlansoprazole (DEXILANT) 60 MG capsule Take 60 mg by mouth daily.      Marland Kitchen diltiazem (CARDIZEM CD) 120 MG 24 hr capsule       . diltiazem (DILACOR XR) 120 MG 24 hr capsule Take 120 mg by mouth daily.      Marland Kitchen gabapentin (NEURONTIN) 600 MG tablet Take 600 mg by mouth 2 (two) times daily.       . hydrochlorothiazide (HYDRODIURIL) 25 MG tablet Take 25 mg by mouth daily.      Marland Kitchen loperamide (IMODIUM A-D) 2 MG tablet Take 2 mg by mouth 4 (four) times daily as needed. For diarrhea      . losartan (COZAAR) 50 MG tablet Take 50 mg by mouth daily.      . potassium chloride (K-DUR,KLOR-CON) 10 MEQ tablet Take 10 mEq by mouth daily.      . propranolol (INDERAL) 80 MG tablet Take 80 mg by mouth 2 (two) times daily.      . tapentadol (NUCYNTA) 50 MG TABS tablet Take 1 tablet (50 mg total) by mouth every 6 (six) hours as needed.  120 tablet  0  . venlafaxine (EFFEXOR) 37.5 MG tablet 1 tablet daily.       No current facility-administered medications for this visit.    Allergies as of 07/09/2014 - Review Complete 07/09/2014  Allergen Reaction Noted  . Plavix  [clopidogrel bisulfate] Itching and Other (See Comments) 09/06/2012  . Warfarin and related Other (See Comments) 09/03/2012  . Adhesive [tape] Other (See Comments) 09/20/2012    Vitals: BP 110/67  Pulse 67  Ht 5\' 6"  (1.676 m)  Wt 190 lb (86.183 kg)  BMI 30.68 kg/m2 Last Weight:  Wt Readings from Last 1 Encounters:  07/09/14 190 lb (86.183 kg)   Last Height:   Ht Readings from Last 1 Encounters:  07/09/14 5\' 6"  (1.676 m)     Physical exam: Gen: NAD, conversant  Eyes: anicteric sclerae, moist conjunctivae  HENT: Atraumati, tenderness to palpation in R temporal region  Neck: Trachea midline; supple,  Lungs: CTA, no wheezing, rales, rhonic  CV: RRR, no MRG  Abdomen: Soft, non-tender;  Extremities: No peripheral edema  Skin: Normal temperature, no rash,  Psych: Appropriate affect, pleasant    Ashley: MOCA 22/30   CN:  PERRL, EOMI no nystagmus, VFF to FC, mild R ptosis, sensation intact to LT V1-V3 bilat, face symmetric, no weakness, hearing grossly intact, palate elevates symmetrically, shoulder shrug 5/5 bilat,  tongue protrudes midline, no fasiculations noted.  Motor: normal bulk and tone  Strength:  5/5 In all extremities  Coord: rapid alternating and point-to-point (FNF, HTS) movements intact.  Reflexes: symmetrical, bilat downgoing toes  Sens: LT intact in all extremities, decreased proprioception LLE  Gait: antalgic gait, uses a cane    Assessment:  After physical and neurologic examination, review of laboratory studies, imaging, neurophysiology testing and pre-existing records, assessment will be reviewed on the problem list.  Plan:  Treatment plan and additional workup will be reviewed under Problem List.  1)Cognitive decline 2)Depression 3)Lumbar back pain  Ashley Hanson is a 73y/o woman followed in neurology clinic for chronic neck/back pain, cognitive decline and depression presenting for follow up with main concern being cognitive decline and depression.  chronic back and MSK pain. Her main concern today is worsening depression and cognitive decline. Suspect that her decrease in cognitive function may be partly related to worsening depression. She is currently taking Venlafaxine ER 37.5 mg daily, tolerating this well. Will increase dose to 75mg  daily. Will check B12, TSH, MMA. If unremarkable can consider starting Aricept or Exelon.   Jim Like, DO  [facility] [address] [place], [place] [address]  Phone [phone] Fax [phone]

## 2014-07-09 NOTE — Patient Instructions (Signed)
Overall you are doing fairly well but I do want to suggest a few things today:   Remember to drink plenty of fluid, eat healthy meals and do not skip any meals. Try to eat protein with a every meal and eat a healthy snack such as fruit or nuts in between meals. Try to keep a regular sleep-wake schedule and try to exercise daily, particularly in the form of walking, 20-30 minutes a day, if you can.   As far as your medications are concerned, I would like to suggest we increase the venlafaxine ER to 75mg  daily. This will help treat your depression  Please have some blood work drawn today:   Follow up as needed. Please call us with any interim questions, concerns, problems, updates or refill requests.   My clinical assistant and will answer any of your questions and relay your messages to me and also relay most of my messages to you.   Our phone number is [phone]. We also have an after hours call service for urgent matters and there is a physician on-call for urgent questions. For any emergencies you know to call 911 or go to the nearest emergency room

## 2014-07-13 ENCOUNTER — Other Ambulatory Visit (INDEPENDENT_AMBULATORY_CARE_PROVIDER_SITE_OTHER): Payer: Self-pay

## 2014-07-13 DIAGNOSIS — Z0289 Encounter for other administrative examinations: Secondary | ICD-10-CM

## 2014-07-16 LAB — VITAMIN B12: Vitamin B-12: 908 pg/mL (ref 211–946)

## 2014-07-16 LAB — TSH: TSH: 5.02 u[IU]/mL — ABNORMAL HIGH (ref 0.450–4.500)

## 2014-07-16 LAB — METHYLMALONIC ACID, SERUM: Methylmalonic Acid: 177 nmol/L (ref 0–378)

## 2014-10-02 HISTORY — PX: HERNIA REPAIR: SHX51

## 2015-07-20 ENCOUNTER — Encounter: Payer: Self-pay | Admitting: Cardiovascular Disease

## 2015-09-03 ENCOUNTER — Other Ambulatory Visit: Payer: Self-pay

## 2015-09-03 DIAGNOSIS — Z1231 Encounter for screening mammogram for malignant neoplasm of breast: Secondary | ICD-10-CM

## 2015-09-08 ENCOUNTER — Ambulatory Visit: Payer: Medicare Other

## 2015-09-27 ENCOUNTER — Ambulatory Visit (INDEPENDENT_AMBULATORY_CARE_PROVIDER_SITE_OTHER): Payer: Medicare Other | Admitting: Neurology

## 2015-09-27 ENCOUNTER — Encounter: Payer: Self-pay | Admitting: Neurology

## 2015-09-27 VITALS — BP 121/67 | HR 55 | Ht 66.0 in | Wt 210.0 lb

## 2015-09-27 DIAGNOSIS — R269 Unspecified abnormalities of gait and mobility: Secondary | ICD-10-CM | POA: Diagnosis not present

## 2015-09-27 DIAGNOSIS — G43709 Chronic migraine without aura, not intractable, without status migrainosus: Secondary | ICD-10-CM

## 2015-09-27 DIAGNOSIS — IMO0002 Reserved for concepts with insufficient information to code with codable children: Secondary | ICD-10-CM

## 2015-09-27 DIAGNOSIS — I10 Essential (primary) hypertension: Secondary | ICD-10-CM | POA: Diagnosis not present

## 2015-09-27 MED ORDER — TOPIRAMATE 50 MG PO TABS: ORAL_TABLET | ORAL | Status: DC

## 2015-09-27 MED ORDER — BUTALBITAL-APAP-CAFFEINE 50-325-40 MG PO TABS: 1.0000 | ORAL_TABLET | Freq: Four times a day (QID) | ORAL | Status: DC | PRN

## 2015-09-27 NOTE — Progress Notes (Signed)
PATIENT: Ashley Hanson DOB: July 27, 1941  Chief Complaint  Patient presents with  . Migraine    She has been keeping daily headaches for the last month.  Headaches are causing her to have blurred vision and dizziness.  . Memory Loss    MMSE - animals.  Reports her memory has continued to decline.     HISTORICAL  Ashley Hanson is a 74 years old right-handed female, last clinical visit was with Dr. Jim Like August 2015 previously a patient of Dr. Erling Cruz  She had a history of chronic ocular migraine the past, presented with colorful visual distortion, oftentimes followed by retro-orbital area headaches, when she was younger she has headaches frequently, sometimes the point of passing out, her headache has much improved, especially while take gabapentin 600 mg twice a day, since September 2016, she began to have frequent almost daily pressure behind both eyes, also complains of decreased vision, she has tried over-the-counter ibuprofen Tylenol did not work for her,  She had a history of macular degeneration with decreased vision to begin with, also  had bilateral cataract surgery, recent diagnosis of glaucoma by ophthalmologist Dr. Carolynn Sayers  She complains of many years history of gradual onset memory trouble, she retired as a Therapist, occupational, she still able to drive, lives in apartment by herself, manage her whole own finances,  Gait difficulty due to bilateral feet pain, scoliosis, chronic low back pain.  REVIEW OF SYSTEMS: Full 14 system review of systems performed and notable only for blurred vision, headaches  ALLERGIES: Allergies  Allergen Reactions  . Plavix [Clopidogrel Bisulfate] Itching and Other (See Comments)    Causes migraines. Itching all over the body.  . Warfarin And Related Other (See Comments)    Blood thinners - cause migraines in patient.  . Adhesive [Tape] Other (See Comments)    Severe irritation of skin    HOME MEDICATIONS: Current Outpatient Prescriptions    Medication Sig Dispense Refill  . ALPRAZolam (XANAX) 0.25 MG tablet Take one tab in am, one tab in afternoon, and two tabs at bedtime.    . Aspirin 81 MG EC tablet Take 81 mg by mouth daily.    . Calcium Carbonate-Vit D-Min (CALCIUM 1200 PO) Take 1 tablet by mouth 2 (two) times daily.    . Cholecalciferol (VITAMIN D-3 PO) Take 1 tablet by mouth daily.    Marland Kitchen diltiazem (CARDIZEM CD) 120 MG 24 hr capsule     . furosemide (LASIX) 40 MG tablet     . gabapentin (NEURONTIN) 600 MG tablet Take 600 mg by mouth 2 (two) times daily.     Marland Kitchen loperamide (IMODIUM A-D) 2 MG tablet Take 2 mg by mouth 4 (four) times daily as needed. For diarrhea    . losartan (COZAAR) 50 MG tablet Take 50 mg by mouth daily.    . potassium chloride (K-DUR,KLOR-CON) 10 MEQ tablet Take 10 mEq by mouth daily.    . propranolol (INDERAL) 80 MG tablet Take 80 mg by mouth 2 (two) times daily.    Marland Kitchen venlafaxine XR (EFFEXOR XR) 75 MG 24 hr capsule Take 1 capsule (75 mg total) by mouth daily with breakfast. 30 capsule 6   No current facility-administered medications for this visit.    PAST MEDICAL HISTORY: Past Medical History  Diagnosis Date  . Gallstones   . Esophageal reflux   . Osteoporosis   . Cancer [facility])     breast cancer  . Migraines   . Abdominal pain   .  Nausea   . Confusion   . Depression   . Dementia     early stages per patient  . Irritable bowel   . Diverticulosis   . Hypertension     sees [name]  . Bronchitis   . Dementia     "early stages per patient" and long term memory loss  . Carpal tunnel syndrome     bilaterally  . Arthritis   . Cataracts, bilateral   . Complication of anesthesia     "difficutly waking up and hypothermia and stopped breathing"  . PONV (postoperative nausea and vomiting)   . Anginal pain [facility])     sees [name]; reportedly negative stress/echo 2012  . Refusal of blood transfusions as patient is Jehovah's Witness     PAST SURGICAL HISTORY: Past Surgical  History  Procedure Laterality Date  . Tubal ligation    . Esophagogastroduodenoscopy  08/30/11  . Colonoscopy  08/30/11  . Knee surgery    . Dilation and curettage of uterus    . Ankle surgery    . Skin graft    . Shoulder surgery  06/2011  . Wrist surgery  1995  . Joint replacement      knee - right  . Breast surgery      x 4  . Cardiovascular stress test    . Doppler echocardiography      hx   . Keloid excision      from bilateral earlobes  . Laparoscopic cholecystectomy w/ cholangiography  10/04/2012  . Ercp  10/05/2012    Procedure: ENDOSCOPIC RETROGRADE CHOLANGIOPANCREATOGRAPHY (ERCP);  Surgeon: Jeryl Columbia, MD;  Location: [facility] ENDOSCOPY;  Service: Endoscopy;  Laterality: N/A;  . Cholecystectomy  10/04/2012    Procedure: LAPAROSCOPIC CHOLECYSTECTOMY WITH INTRAOPERATIVE CHOLANGIOGRAM;  Surgeon: Adin Hector, MD;  Location: [place];  Service: General;  Laterality: N/A;  laparosopic cholecystectomy with cholangiogram and repair of umbilical hernia    FAMILY HISTORY: Family History  Problem Relation Age of Onset  . Colon polyps Mother     SOCIAL HISTORY:  Social History   Social History  . Marital Status: Widowed    Spouse Name: N/A  . Number of Children: 2  . Years of Education: 12   Occupational History  . Sales     Retired   Social History Main Topics  . Smoking status: Never Smoker   . Smokeless tobacco: Never Used  . Alcohol Use: No  . Drug Use: No  . Sexual Activity: Not on file   Other Topics Concern  . Not on file   Social History Narrative   Patient lives at home with her father and he is blind. Patient is retired. Patient is widowed and has two children.   Right handed.   Caffeine- None     PHYSICAL EXAM   Filed Vitals:   09/27/15 1311  Height: 5' 6"  (1.676 m)  Weight: 210 lb (95.255 kg)    Not recorded      Body mass index is 33.91 kg/(m^2).  PHYSICAL EXAMNIATION:  Gen: NAD, conversant, well nourised, obese, well groomed                      Cardiovascular: Regular rate rhythm, no peripheral edema, warm, nontender. Eyes: Conjunctivae clear without exudates or hemorrhage Neck: Supple, no carotid bruise. Pulmonary: Clear to auscultation bilaterally   NEUROLOGICAL EXAM:  MENTAL STATUS: Speech:    Speech is normal; fluent and spontaneous with normal  comprehension.  Cognition: MMSE 29/30     Orientation to time, place and person     Recent and remote memory: missed 1/3 recall.     Normal Attention span and concentration     Normal Language, naming, repeating,spontaneous speech     Fund of knowledge   CRANIAL NERVES: CN II: Visual fields are full to confrontation. Fundoscopic exam is normal with sharp discs and no vascular changes. Pupils are round equal and briskly reactive to light. CN III, IV, VI: extraocular movement are normal. No ptosis. CN V: Facial sensation is intact to pinprick in all 3 divisions bilaterally. Corneal responses are intact.  CN VII: Face is symmetric with normal eye closure and smile. CN VIII: Hearing is normal to rubbing fingers CN IX, X: Palate elevates symmetrically. Phonation is normal. CN XI: Head turning and shoulder shrug are intact CN XII: Tongue is midline with normal movements and no atrophy.  MOTOR: There is no pronator drift of out-stretched arms. Muscle bulk and tone are normal. Muscle strength is normal.  REFLEXES: Reflexes are 2+ and symmetric at the biceps, triceps, knees, and ankles. Plantar responses are flexor.  SENSORY: Intact to light touch, pinprick, position sense, and vibration sense are intact in fingers and toes.  COORDINATION: Rapid alternating movements and fine finger movements are intact. There is no dysmetria on finger-to-nose and heel-knee-shin.    GAIT/STANCE: She needs the cane to get up from seated position, scoliosis, unsteady,   DIAGNOSTIC DATA (LABS, IMAGING, TESTING) - I reviewed patient records, labs, notes, testing and imaging myself  where available.   ASSESSMENT AND PLAN  Ashley Hanson is a 74 y.o. female   Gait difficulty  Due to bilateral feet pain, low back pain, scoliosis Increased frequency of headaches,  Retro-orbital area headache with visual distortion  ESR C-reactive protein to rule out temporal arteritis   add on Topamax titrating to 50 mg 2 tablets twice a day  Fioricet as needed  Also suggested her follow-up with her ophthalmologist Dr. Carolynn Sayers soon, with her most recent diagnosis of glaucoma Mild cognitive impairment   Mini-Mental Status Examination is 45 out of 30   Marcial Pacas, M.D. Ph.D.  [facility] [address] [place], [place] [address] Ph: [phone] Fax: [phone]  CC: Levin Erp, MD

## 2015-09-28 ENCOUNTER — Telehealth: Payer: Self-pay | Admitting: Neurology

## 2015-09-28 LAB — C-REACTIVE PROTEIN: CRP: 20.9 mg/L — ABNORMAL HIGH (ref 0.0–4.9)

## 2015-09-28 LAB — SEDIMENTATION RATE: Sed Rate: 20 mm/hr (ref 0–40)

## 2015-09-28 NOTE — Telephone Encounter (Signed)
Please call patient, laboratory showed elevated C reactive protein 20, ESR was within normal limit, may consider repeat laboratory if she continue have headaches at her next follow-up visit.  Make sure she follow-up with her ophthalmologist, and fax the results to our office

## 2015-09-28 NOTE — Telephone Encounter (Signed)
Spoke to patient - she is aware of results.  She will be seeing Dr. Katy Fitch in a few weeks and she will ask him to forward the visit notes.  I have provided her with our fax number.

## 2015-09-29 ENCOUNTER — Ambulatory Visit
Admission: RE | Admit: 2015-09-29 | Discharge: 2015-09-29 | Disposition: A | Discharge status: Home / Self Care | Payer: Medicare Other | Source: Ambulatory Visit

## 2015-09-29 DIAGNOSIS — Z1231 Encounter for screening mammogram for malignant neoplasm of breast: Secondary | ICD-10-CM

## 2015-11-01 ENCOUNTER — Ambulatory Visit (INDEPENDENT_AMBULATORY_CARE_PROVIDER_SITE_OTHER): Payer: Medicare Other | Admitting: Neurology

## 2015-11-01 DIAGNOSIS — G43709 Chronic migraine without aura, not intractable, without status migrainosus: Secondary | ICD-10-CM

## 2015-11-01 DIAGNOSIS — R269 Unspecified abnormalities of gait and mobility: Secondary | ICD-10-CM

## 2015-11-01 DIAGNOSIS — IMO0002 Reserved for concepts with insufficient information to code with codable children: Secondary | ICD-10-CM

## 2015-11-01 MED ORDER — TRAMADOL HCL 50 MG PO TABS: 50.0000 mg | ORAL_TABLET | Freq: Four times a day (QID) | ORAL | Status: DC | PRN

## 2015-11-01 MED ORDER — BUTALBITAL-APAP-CAFFEINE 50-325-40 MG PO TABS: 1.0000 | ORAL_TABLET | Freq: Four times a day (QID) | ORAL | Status: DC | PRN

## 2015-11-01 MED ORDER — PREDNISONE 10 MG PO TABS: ORAL_TABLET | ORAL | Status: DC

## 2015-11-01 NOTE — Progress Notes (Signed)
Chief Complaint  Patient presents with  . Migraine    She is still having daily headaches despite taking Topamax 117m BID.  She has not tried Fioricet yet.  She had an abnormal eye exam with Dr. GKaty Fitchon 10/28/15.      PATIENT: Ashley SchwimmerDOB: [birth year] Chief Complaint  Patient presents with  . Migraine    She is still having daily headaches despite taking Topamax 1035mBID.  She has not tried Fioricet yet.  She had an abnormal eye exam with Dr. GrKaty Fitchn 10/28/15.     HISTORICAL  PeYamile Hanson a [age]ears old right-handed female, last clinical visit was with Dr. PeJim Likeugust 2015 previously a patient of Dr. LoErling CruzShe had a history of chronic ocular migraine the past, presented with colorful visual distortion, oftentimes followed by retro-orbital area headaches, when she was younger she has headaches frequently, sometimes the point of passing out, her headache has much improved, especially while take gabapentin 600 mg twice a day, since September 2016, she began to have frequent almost daily pressure behind both eyes, also complains of decreased vision, she has tried over-the-counter ibuprofen Tylenol did not work for her,  She had a history of macular degeneration with decreased vision to begin with, also  had bilateral cataract surgery, recent diagnosis of glaucoma by ophthalmologist Dr. GrCarolynn SayersShe complains of many years history of gradual onset memory trouble, she retired as a RoTherapist, occupationalshe still able to drive, lives in apartment by herself, manage her whole own finances,  Gait difficulty due to bilateral feet pain, scoliosis, chronic low back pain.  UPDATE Nov 01 2015: I reviewed a note from her ophthalmologist Dr. GrKaty Fitchated October 28 2015, patient complains of visual distortions of shapes and sizes, movement of visual imaging's, Funduscopy examination showed normal appearance of optic nerve with healthy pink cream and normal appearance nerve fiber layers, macular  showed dry age-related macular degeneration, vessels are normal,  vitreous was normal, The conclusion was suspected glaucoma, OD was 12, OS was 14, reported that her father had a history of glaucoma  She was put on 3 eye drops, she has not started yet, she also had bilateral conjunctiva erythematous change, was diagnosed with recurrent iridocystitis  Previous laboratory evaluation showed elevated C reactive protein of 21, with normal ESR,  She wear sunglasses, she complains of light sensitive, she continues both retro-orbital area pressure pain, constant 9/10, x24 hours. Topamax 10039mid did not help her headaches  REVIEW OF SYSTEMS: Full 14 system review of systems performed and notable only for fatigue, joint pain, joint swelling, muscle cramps, neck pain, neck stiffness  ALLERGIES: Allergies  Allergen Reactions  . Plavix [Clopidogrel Bisulfate] Itching and Other (See Comments)    Causes migraines. Itching all over the body.  . Warfarin And Related Other (See Comments)    Blood thinners - cause migraines in patient.  . Adhesive [Tape] Other (See Comments)    Severe irritation of skin    HOME MEDICATIONS: Current Outpatient Prescriptions  Medication Sig Dispense Refill  . Acetaminophen (TYLENOL PO) Take by mouth as needed.    . ALPRAZolam (XANAX) 0.25 MG tablet Take one tab in am, one tab in afternoon, and two tabs at bedtime.    . Aspirin 81 MG EC tablet Take 81 mg by mouth daily.    . butalbital-acetaminophen-caffeine (FIORICET, ESGIC) 50-325-40 MG tablet Take 1 tablet by mouth every 6 (six) hours as needed for headache. 15 tablet  3  . Calcium Carbonate-Vit D-Min (CALCIUM 1200 PO) Take 1 tablet by mouth 2 (two) times daily.    . Cholecalciferol (VITAMIN D-3 PO) Take 1 tablet by mouth daily.    Marland Kitchen diltiazem (CARDIZEM CD) 120 MG 24 hr capsule     . furosemide (LASIX) 40 MG tablet     . gabapentin (NEURONTIN) 600 MG tablet Take 600 mg by mouth 2 (two) times daily.     Marland Kitchen ketorolac  (ACULAR) 0.4 % SOLN     . loperamide (IMODIUM A-D) 2 MG tablet Take 2 mg by mouth 4 (four) times daily as needed. For diarrhea    . losartan (COZAAR) 50 MG tablet Take 50 mg by mouth daily.    . Multiple Vitamins-Minerals (PRESERVISION AREDS PO) Take by mouth daily.    Marland Kitchen ofloxacin (OCUFLOX) 0.3 % ophthalmic solution     . potassium chloride (K-DUR,KLOR-CON) 10 MEQ tablet Take 10 mEq by mouth daily.    . Probiotic Product (PROBIOTIC PO) Take by mouth daily.    . propranolol (INDERAL) 80 MG tablet Take 80 mg by mouth 2 (two) times daily.    Marland Kitchen topiramate (TOPAMAX) 50 MG tablet One po bid xone week, then 2 tabs po bid 120 tablet 11  . venlafaxine XR (EFFEXOR XR) 75 MG 24 hr capsule Take 1 capsule (75 mg total) by mouth daily with breakfast. 30 capsule 6   No current facility-administered medications for this visit.    PAST MEDICAL HISTORY: Past Medical History  Diagnosis Date  . Gallstones   . Esophageal reflux   . Osteoporosis   . Cancer [facility])     breast cancer  . Migraines   . Abdominal pain   . Nausea   . Confusion   . Depression   . Dementia     early stages per patient  . Irritable bowel   . Diverticulosis   . Hypertension     sees [name]  . Bronchitis   . Dementia     "early stages per patient" and long term memory loss  . Carpal tunnel syndrome     bilaterally  . Arthritis   . Cataracts, bilateral   . Complication of anesthesia     "difficutly waking up and hypothermia and stopped breathing"  . PONV (postoperative nausea and vomiting)   . Anginal pain [facility])     sees [name]; reportedly negative stress/echo 2012  . Refusal of blood transfusions as patient is Jehovah's Witness     PAST SURGICAL HISTORY: Past Surgical History  Procedure Laterality Date  . Tubal ligation    . Esophagogastroduodenoscopy  08/30/11  . Colonoscopy  08/30/11  . Knee surgery    . Dilation and curettage of uterus    . Ankle surgery    . Skin graft    . Shoulder  surgery  06/2011  . Wrist surgery  1995  . Joint replacement      knee - right  . Breast surgery      x 4  . Cardiovascular stress test    . Doppler echocardiography      hx   . Keloid excision      from bilateral earlobes  . Laparoscopic cholecystectomy w/ cholangiography  10/04/2012  . Ercp  10/05/2012    Procedure: ENDOSCOPIC RETROGRADE CHOLANGIOPANCREATOGRAPHY (ERCP);  Surgeon: Jeryl Columbia, MD;  Location: [facility] ENDOSCOPY;  Service: Endoscopy;  Laterality: N/A;  . Cholecystectomy  10/04/2012    Procedure: LAPAROSCOPIC CHOLECYSTECTOMY WITH INTRAOPERATIVE  CHOLANGIOGRAM;  Surgeon: Adin Hector, MD;  Location: [facility];  Service: General;  Laterality: N/A;  laparosopic cholecystectomy with cholangiogram and repair of umbilical hernia    FAMILY HISTORY: Family History  Problem Relation Age of Onset  . Colon polyps Mother     SOCIAL HISTORY:  Social History   Social History  . Marital Status: Widowed    Spouse Name: N/A  . Number of Children: 2  . Years of Education: 12   Occupational History  . Sales     Retired   Social History Main Topics  . Smoking status: Never Smoker   . Smokeless tobacco: Never Used  . Alcohol Use: No  . Drug Use: No  . Sexual Activity: Not on file   Other Topics Concern  . Not on file   Social History Narrative   Patient lives at home with her father and he is blind. Patient is retired. Patient is widowed and has two children.   Right handed.   Caffeine- None     PHYSICAL EXAM   There were no vitals filed for this visit.  Not recorded      There is no weight on file to calculate BMI.  PHYSICAL EXAMNIATION:  Gen: NAD, conversant, well nourised, obese, well groomed                     Cardiovascular: Regular rate rhythm, no peripheral edema, warm, nontender. Eyes: Conjunctivae clear without exudates or hemorrhage Neck: Supple, no carotid bruise. Pulmonary: Clear to auscultation bilaterally   NEUROLOGICAL EXAM:  MENTAL  STATUS: Speech:    Speech is normal; fluent and spontaneous with normal comprehension.  Cognition: MMSE 29/30     Orientation to time, place and person     Recent and remote memory: missed 1/3 recall.     Normal Attention span and concentration     Normal Language, naming, repeating,spontaneous speech     Fund of knowledge   CRANIAL NERVES: CN II: Visual fields are full to confrontation. Fundoscopic exam is normal with sharp discs and no vascular changes. Pupils are round equal and briskly reactive to light. She has bilateral conjunctiva erythematous changes CN III, IV, VI: extraocular movement are normal. No ptosis. CN V: Facial sensation is intact to pinprick in all 3 divisions bilaterally. Corneal responses are intact.  CN VII: Face is symmetric with normal eye closure and smile. CN VIII: Hearing is normal to rubbing fingers CN IX, X: Palate elevates symmetrically. Phonation is normal. CN XI: Head turning and shoulder shrug are intact CN XII: Tongue is midline with normal movements and no atrophy.  MOTOR: There is no pronator drift of out-stretched arms. Muscle bulk and tone are normal. Muscle strength is normal.  REFLEXES: Reflexes are 2+ and symmetric at the biceps, triceps, knees, and ankles. Plantar responses are flexor.  SENSORY: Intact to light touch, pinprick, position sense, and vibration sense are intact in fingers and toes.  COORDINATION: Rapid alternating movements and fine finger movements are intact. There is no dysmetria on finger-to-nose and heel-knee-shin.    GAIT/STANCE: She needs the cane to get up from seated position, scoliosis, unsteady,   DIAGNOSTIC DATA (LABS, IMAGING, TESTING) - I reviewed patient records, labs, notes, testing and imaging myself where available.   ASSESSMENT AND PLAN  Brayli Klingbeil is a 74 y.o. female   Gait difficulty  Due to bilateral feet pain, low back pain, scoliosis  Increased frequency of headaches, recent diagnosis of  bilateral glaucoma  Retro-orbital area headache with visual distortion  Keep Topamax titrating to 50 mg 2 tablets twice a day  Tramadol, Fioricet as needed  Her glaucoma might play a role in her complains of behind eye pain, I have suggested her starting eyedrops   Mild cognitive impairment   Mini-Mental Status Examination is 29 out of 30   Marcial Pacas, M.D. Ph.D.  [facility] [address] [place], [place] [address] Ph: [phone] Fax: [phone]  CC: Levin Erp, MD

## 2015-11-02 ENCOUNTER — Telehealth: Payer: Self-pay | Admitting: Neurology

## 2015-11-02 ENCOUNTER — Encounter: Payer: Self-pay | Admitting: Neurology

## 2015-11-02 LAB — C-REACTIVE PROTEIN: CRP: 8.1 mg/L — ABNORMAL HIGH (ref 0.0–4.9)

## 2015-11-02 LAB — SEDIMENTATION RATE: Sed Rate: 35 mm/hr (ref 0–40)

## 2015-11-02 NOTE — Telephone Encounter (Signed)
Spoke to [place] - she is aware of results.

## 2015-11-02 NOTE — Telephone Encounter (Signed)
Please call patient, repeat lab showed improvement, only mildly elevated CRP, otherwise normal ESR.

## 2015-11-18 ENCOUNTER — Encounter: Payer: Self-pay | Admitting: Neurology

## 2015-11-18 ENCOUNTER — Ambulatory Visit (INDEPENDENT_AMBULATORY_CARE_PROVIDER_SITE_OTHER): Payer: Medicare Other | Admitting: Neurology

## 2015-11-18 ENCOUNTER — Encounter (INDEPENDENT_AMBULATORY_CARE_PROVIDER_SITE_OTHER): Payer: Self-pay

## 2015-11-18 VITALS — BP 120/82 | HR 66 | Resp 20 | Ht 65.5 in | Wt 212.0 lb

## 2015-11-18 DIAGNOSIS — R269 Unspecified abnormalities of gait and mobility: Secondary | ICD-10-CM

## 2015-11-18 DIAGNOSIS — IMO0002 Reserved for concepts with insufficient information to code with codable children: Secondary | ICD-10-CM

## 2015-11-18 DIAGNOSIS — G43709 Chronic migraine without aura, not intractable, without status migrainosus: Secondary | ICD-10-CM

## 2015-11-18 DIAGNOSIS — M545 Low back pain: Secondary | ICD-10-CM

## 2015-11-18 MED ORDER — PREGABALIN 50 MG PO CAPS: ORAL_CAPSULE | ORAL | Status: DC

## 2015-11-18 NOTE — Progress Notes (Signed)
Chief Complaint  Patient presents with  . Follow-up  . Migraine    pt reports her migraines are better, still having them everyday, but they are not as painful, pt can see now without having to wear sunglasses, isn't taking the prednisone, rm 4, alone   Chief Complaint  Patient presents with  . Follow-up  . Migraine    pt reports her migraines are better, still having them everyday, but they are not as painful, pt can see now without having to wear sunglasses, isn't taking the prednisone, rm 4, alone      PATIENT: Ashley Hanson DOB: 1941/01/24  Chief Complaint  Patient presents with  . Follow-up  . Migraine    pt reports her migraines are better, still having them everyday, but they are not as painful, pt can see now without having to wear sunglasses, isn't taking the prednisone, rm 4, alone     HISTORICAL  Ashley Hanson is a 74 years old right-handed female, last clinical visit was with Dr. Jim Like August 2015 previously a patient of Dr. Erling Cruz  She had a history of chronic ocular migraine the past, presented with colorful visual distortion, oftentimes followed by retro-orbital area headaches, when she was younger she has headaches frequently, sometimes the point of passing out, her headache has much improved, especially while take gabapentin 600 mg twice a day, since September 2016, she began to have frequent almost daily pressure behind both eyes, also complains of decreased vision, she has tried over-the-counter ibuprofen Tylenol did not work for her,  She had a history of macular degeneration with decreased vision to begin with, also  had bilateral cataract surgery, recent diagnosis of glaucoma by ophthalmologist Dr. Carolynn Sayers  She complains of many years history of gradual onset memory trouble, she retired as a Therapist, occupational, she still able to drive, lives in apartment by herself, manage her whole own finances,  Gait difficulty due to bilateral feet pain, scoliosis, chronic low  back pain.  UPDATE Nov 01 2015: I reviewed a note from her ophthalmologist Dr. Katy Fitch dated October 28 2015, patient complains of visual distortions of shapes and sizes, movement of visual imagings, Funduscopy examination showed normal appearance of optic nerve with healthy pink cream and normal appearance nerve fiber layers, macular showed dry age-related macular degeneration, vessels are normal,  vitreous was normal, The conclusion was suspected glaucoma, OD was 12, OS was 14, reported that her father had a history of glaucoma  She was put on 3 eye drops, she has not started yet, she also had bilateral conjunctiva erythematous change, was diagnosed with recurrent iridocystitis  Previous laboratory evaluation showed elevated C reactive protein of 21, with normal ESR,  She wear sunglasses, she complains of light sensitive, she continues both retro-orbital area pressure pain, constant 9/10, x24 hours. Topamax 156m bid did not help her headaches  UPDATE Nov 18 2015: Her headache and conjunctiva erythematous has much improved, everytime she took Fioricet, she had GI side effect, feel sick, band like sensation. She was started on prednisone since December twelfth 2016, started from 20 mg daily, now 10 milligrams daily, she can tolerate the medication well, which has helped her headaches, she is also on higher dose of Topamax 100 mg twice a day,  Gabapentin up to 600 mg twice a day did not help her headaches, or her bilateral lower extremity paresthesia pain, will change to Lyrica today,  Previous laboratory showed elevated C reactive protein up to 20.9, her headache has much improved  with prednisone, likely a component of inflammatory process  REVIEW OF SYSTEMS: Full 14 system review of systems performed and notable only for fatigue, excessive sweating, light sensitivity, abdominal pain, constipation, back pain, muscle cramps, neck pain, memory loss, speech difficulty, confusion, depression    ALLERGIES: Allergies  Allergen Reactions  . Plavix [Clopidogrel Bisulfate] Itching and Other (See Comments)    Causes migraines. Itching all over the body.  . Warfarin And Related Other (See Comments)    Blood thinners - cause migraines in patient.  . Adhesive [Tape] Other (See Comments)    Severe irritation of skin    HOME MEDICATIONS: Current Outpatient Prescriptions  Medication Sig Dispense Refill  . Acetaminophen (TYLENOL PO) Take by mouth as needed.    . ALPRAZolam (XANAX) 0.25 MG tablet Take one tab in am, one tab in afternoon, and two tabs at bedtime.    . Aspirin 81 MG EC tablet Take 81 mg by mouth daily.    . butalbital-acetaminophen-caffeine (FIORICET, ESGIC) 50-325-40 MG tablet Take 1 tablet by mouth every 6 (six) hours as needed for headache. 15 tablet 3  . Calcium Carbonate-Vit D-Min (CALCIUM 1200 PO) Take 1 tablet by mouth 2 (two) times daily.    . Cholecalciferol (VITAMIN D-3 PO) Take 1 tablet by mouth daily.    Marland Kitchen diltiazem (CARDIZEM CD) 120 MG 24 hr capsule     . fluorometholone (FML) 0.1 % ophthalmic suspension     . furosemide (LASIX) 40 MG tablet     . gabapentin (NEURONTIN) 600 MG tablet Take 600 mg by mouth 2 (two) times daily.     Marland Kitchen ketorolac (ACULAR) 0.4 % SOLN     . loperamide (IMODIUM A-D) 2 MG tablet Take 2 mg by mouth 4 (four) times daily as needed. For diarrhea    . losartan (COZAAR) 50 MG tablet Take 50 mg by mouth daily.    . Multiple Vitamins-Minerals (PRESERVISION AREDS PO) Take by mouth daily.    Marland Kitchen ofloxacin (OCUFLOX) 0.3 % ophthalmic solution     . potassium chloride (K-DUR,KLOR-CON) 10 MEQ tablet Take 10 mEq by mouth daily.    . Probiotic Product (PROBIOTIC PO) Take by mouth daily.    . propranolol (INDERAL) 80 MG tablet Take 80 mg by mouth 2 (two) times daily.    Marland Kitchen topiramate (TOPAMAX) 50 MG tablet One po bid xone week, then 2 tabs po bid 120 tablet 11  . traMADol (ULTRAM) 50 MG tablet Take 1 tablet (50 mg total) by mouth every 6 (six) hours as  needed. 30 tablet 2  . venlafaxine XR (EFFEXOR XR) 75 MG 24 hr capsule Take 1 capsule (75 mg total) by mouth daily with breakfast. 30 capsule 6  . predniSONE (DELTASONE) 10 MG tablet Reported on 11/18/2015     No current facility-administered medications for this visit.    PAST MEDICAL HISTORY: Past Medical History  Diagnosis Date  . Gallstones   . Esophageal reflux   . Osteoporosis   . Cancer [facility])     breast cancer  . Migraines   . Abdominal pain   . Nausea   . Confusion   . Depression   . Dementia     early stages per patient  . Irritable bowel   . Diverticulosis   . Hypertension     sees [name]  . Bronchitis   . Dementia     "early stages per patient" and long term memory loss  . Carpal tunnel syndrome  bilaterally  . Arthritis   . Cataracts, bilateral   . Complication of anesthesia     "difficutly waking up and hypothermia and stopped breathing"  . PONV (postoperative nausea and vomiting)   . Anginal pain [facility])     sees [name]; reportedly negative stress/echo 2012  . Refusal of blood transfusions as patient is Jehovah's Witness     PAST SURGICAL HISTORY: Past Surgical History  Procedure Laterality Date  . Tubal ligation    . Esophagogastroduodenoscopy  08/30/11  . Colonoscopy  08/30/11  . Knee surgery    . Dilation and curettage of uterus    . Ankle surgery    . Skin graft    . Shoulder surgery  06/2011  . Wrist surgery  1995  . Joint replacement      knee - right  . Breast surgery      x 4  . Cardiovascular stress test    . Doppler echocardiography      hx   . Keloid excision      from bilateral earlobes  . Laparoscopic cholecystectomy w/ cholangiography  10/04/2012  . Ercp  10/05/2012    Procedure: ENDOSCOPIC RETROGRADE CHOLANGIOPANCREATOGRAPHY (ERCP);  Surgeon: Jeryl Columbia, MD;  Location: [facility] ENDOSCOPY;  Service: Endoscopy;  Laterality: N/A;  . Cholecystectomy  10/04/2012    Procedure: LAPAROSCOPIC CHOLECYSTECTOMY WITH  INTRAOPERATIVE CHOLANGIOGRAM;  Surgeon: Adin Hector, MD;  Location: [place];  Service: General;  Laterality: N/A;  laparosopic cholecystectomy with cholangiogram and repair of umbilical hernia    FAMILY HISTORY: Family History  Problem Relation Age of Onset  . Colon polyps Mother     SOCIAL HISTORY:  Social History   Social History  . Marital Status: Widowed    Spouse Name: N/A  . Number of Children: 2  . Years of Education: 12   Occupational History  . Sales     Retired   Social History Main Topics  . Smoking status: Never Smoker   . Smokeless tobacco: Never Used  . Alcohol Use: No  . Drug Use: No  . Sexual Activity: Not on file   Other Topics Concern  . Not on file   Social History Narrative   Patient lives at home with her father and he is blind. Patient is retired. Patient is widowed and has two children.   Right handed.   Caffeine- None     PHYSICAL EXAM   Filed Vitals:   11/18/15 1357  BP: 120/82  Pulse: 66  Resp: 20  Height: 5' 5.5" (1.664 m)  Weight: 212 lb (96.163 kg)    Not recorded      Body mass index is 34.73 kg/(m^2).  PHYSICAL EXAMNIATION:  Gen: NAD, conversant, well nourised, obese, well groomed                     Cardiovascular: Regular rate rhythm, no peripheral edema, warm, nontender. Eyes: Conjunctivae clear without exudates or hemorrhage Neck: Supple, no carotid bruise. Pulmonary: Clear to auscultation bilaterally   NEUROLOGICAL EXAM:  MENTAL STATUS: Speech:    Speech is normal; fluent and spontaneous with normal comprehension.  Cognition: MMSE 29/30     Orientation to time, place and person     Recent and remote memory: missed 1/3 recall.     Normal Attention span and concentration     Normal Language, naming, repeating,spontaneous speech     Fund of knowledge   CRANIAL NERVES: CN II: Visual fields are full to  confrontation. Fundoscopic exam is normal with sharp discs and no vascular changes. Pupils are round equal  and briskly reactive to light. She has bilateral conjunctiva erythematous changes CN III, IV, VI: extraocular movement are normal. No ptosis. CN V: Facial sensation is intact to pinprick in all 3 divisions bilaterally. Corneal responses are intact.  CN VII: Face is symmetric with normal eye closure and smile. CN VIII: Hearing is normal to rubbing fingers CN IX, X: Palate elevates symmetrically. Phonation is normal. CN XI: Head turning and shoulder shrug are intact CN XII: Tongue is midline with normal movements and no atrophy.  MOTOR: There is no pronator drift of out-stretched arms. Muscle bulk and tone are normal. Muscle strength is normal.  REFLEXES: Reflexes are 2+ and symmetric at the biceps, triceps, knees, and ankles. Plantar responses are flexor.  SENSORY: Intact to light touch, pinprick, position sense, and vibration sense are intact in fingers and toes.  COORDINATION: Rapid alternating movements and fine finger movements are intact. There is no dysmetria on finger-to-nose and heel-knee-shin.    GAIT/STANCE: She needs the cane to get up from seated position, right knee valgrus   DIAGNOSTIC DATA (LABS, IMAGING, TESTING) - I reviewed patient records, labs, notes, testing and imaging myself where available.   ASSESSMENT AND PLAN  Ashley Hanson is a 74 y.o. female    Increased frequency of headaches, recent diagnosis of bilateral glaucoma, elevated C-reactive protein  Her bilateral eye pain has much improved with higher dose of Topamax 100 mg twice a day  Prednisone, continue taking 10 mg daily until next Visit in one month, we will repeat C reactive protein  Her glaucoma might play a role in her complains of behind eye pain, continue eyedrops, likely a component of inflammatory process   Mild cognitive impairment  Depression anxiety  Gait difficulty  Due to bilateral feet pain, low back pain, scoliosis, right knee pain deformity    Marcial Pacas, M.D. Ph.D.  [facility] [address], [place],  [address] Ph: [phone] Fax: [phone]  CC: Levin Erp, MD

## 2015-11-25 ENCOUNTER — Telehealth: Payer: Self-pay | Admitting: Neurology

## 2015-11-25 NOTE — Telephone Encounter (Signed)
It does not appear we have received anything from the pharmacy or ins.  I called the pharmacy and spoke with [facility].  She did not show a fax generated, but kindly provided the ins info.  Patient has [facility] for prescription benefits ID # [id number].  Ins has been contacted and provided with clinical info.  Request is under review Ref # [id number]  Ins has approved the request for coverage on Lyrica effective until 01/24/2016.  Called patient, got no answer.  Left message.  Notified pharmacy as well.

## 2015-11-25 NOTE — Telephone Encounter (Signed)
Patient is calling. She states her insurance company denied Rx pregabalin (LYRICA) 50 MG capsule because of the dosage. [facility] says they have faxed requests. Please call and advise. Thank you.

## 2015-12-28 ENCOUNTER — Encounter: Payer: Self-pay | Admitting: *Deleted

## 2015-12-28 ENCOUNTER — Ambulatory Visit (INDEPENDENT_AMBULATORY_CARE_PROVIDER_SITE_OTHER): Payer: Medicare Other | Admitting: Neurology

## 2015-12-28 ENCOUNTER — Encounter: Payer: Self-pay | Admitting: Neurology

## 2015-12-28 VITALS — BP 108/62 | HR 60 | Ht 65.5 in | Wt 207.0 lb

## 2015-12-28 DIAGNOSIS — F0390 Unspecified dementia without behavioral disturbance: Secondary | ICD-10-CM

## 2015-12-28 DIAGNOSIS — F329 Major depressive disorder, single episode, unspecified: Secondary | ICD-10-CM | POA: Diagnosis not present

## 2015-12-28 DIAGNOSIS — R519 Headache, unspecified: Secondary | ICD-10-CM

## 2015-12-28 DIAGNOSIS — R51 Headache: Secondary | ICD-10-CM

## 2015-12-28 DIAGNOSIS — R269 Unspecified abnormalities of gait and mobility: Secondary | ICD-10-CM | POA: Diagnosis not present

## 2015-12-28 DIAGNOSIS — G3184 Mild cognitive impairment, so stated: Secondary | ICD-10-CM | POA: Insufficient documentation

## 2015-12-28 DIAGNOSIS — F32A Depression, unspecified: Secondary | ICD-10-CM

## 2015-12-28 MED ORDER — MEMANTINE HCL 10 MG PO TABS: 10.0000 mg | ORAL_TABLET | Freq: Two times a day (BID) | ORAL | Status: DC

## 2015-12-28 MED ORDER — TOPIRAMATE 50 MG PO TABS: ORAL_TABLET | ORAL | Status: DC

## 2015-12-28 NOTE — Progress Notes (Signed)
PATIENT: Ashley Hanson DOB: 09/14/1941  Chief Complaint  Patient presents with  . Migraine    Reports her migraines are well controlled on her current medications.  . Memory Loss    MMSE 23/30 - 12 animals.  Says her memory loss is getting worse. It has cause her difficulty keeping her bills organized.     HISTORICAL  Ashley Hanson is a 75 years old right-handed female, last clinical visit was with Dr. Jim Like August 2015 previously a patient of Dr. Erling Cruz  She had a history of chronic ocular migraine the past, presented with colorful visual distortion, oftentimes followed by retro-orbital area headaches, when she was younger she has headaches frequently, sometimes the point of passing out, her headache has much improved, especially while take gabapentin 600 mg twice a day, since September 2016, she began to have frequent almost daily pressure behind both eyes, also complains of decreased vision, she has tried over-the-counter ibuprofen Tylenol did not work for her,  She had a history of macular degeneration with decreased vision to begin with, also  had bilateral cataract surgery, recent diagnosis of glaucoma by ophthalmologist Dr. Carolynn Sayers  She complains of many years history of gradual onset memory trouble, she retired as a Therapist, occupational, she still able to drive, lives in apartment by herself, manage her whole own finances,  Gait difficulty due to bilateral feet pain, scoliosis, chronic low back pain.  UPDATE Nov 01 2015: I reviewed a note from her ophthalmologist Dr. Katy Fitch dated October 28 2015, patient complains of visual distortions of shapes and sizes, movement of visual imagings, Funduscopy examination showed normal appearance of optic nerve with healthy pink cream and normal appearance nerve fiber layers, macular showed dry age-related macular degeneration, vessels are normal,  vitreous was normal, The conclusion was suspected glaucoma, OD was 12, OS was 14, reported that  her father had a history of glaucoma  She was put on 3 eye drops, she has not started yet, she also had bilateral conjunctiva erythematous change, was diagnosed with recurrent iridocystitis  Previous laboratory evaluation showed elevated C reactive protein of 21, with normal ESR,  She wear sunglasses, she complains of light sensitive, she continues both retro-orbital area pressure pain, constant 9/10, x24 hours. Topamax 133m bid did not help her headaches  UPDATE Nov 18 2015: Her headache and conjunctiva erythematous has much improved, everytime she took Fioricet, she had GI side effect, feel sick, band like sensation. She was started on prednisone since December twelfth 2016, started from 20 mg daily, now 10 milligrams daily, she can tolerate the medication well, which has helped her headaches, she is also on higher dose of Topamax 100 mg twice a day,  Gabapentin up to 600 mg twice a day did not help her headaches, or her bilateral lower extremity paresthesia pain, will change to Lyrica today,  Previous laboratory showed elevated C reactive protein up to 20.9, her headache has much improved with prednisone, likely a component of inflammatory process  UPDATE Dec 28 2015: Her headache, and eye pain has much improved, with no recurrent headache after tapering off prednisone, she is now taking Topamax 100 mg twice a day, she is complaining of increased memory trouble, difficulty focusing, She lives by herself since M2011/04/17when her husband passed away, she graduated from high school, used to work as a sMuseum/gallery curator retired around age 75 she has strong family history of Alzheimer's disease, her maternal grandmother, mother, maternal aunt, sister suffered  Alzheimer's disease   REVIEW OF SYSTEMS: Full 14 system review of systems performed and notable only for excessive sweaty, ringing ears, ear pain, joint pain, low back pain walking difficulty, neck pain, memory loss, dizziness, numbness, speech  difficulty, confusion, depression  ALLERGIES: Allergies  Allergen Reactions  . Plavix [Clopidogrel Bisulfate] Itching and Other (See Comments)    Causes migraines. Itching all over the body.  . Warfarin And Related Other (See Comments)    Blood thinners - cause migraines in patient.  . Adhesive [Tape] Other (See Comments)    Severe irritation of skin    HOME MEDICATIONS: Current Outpatient Prescriptions  Medication Sig Dispense Refill  . Acetaminophen (TYLENOL PO) Take by mouth as needed.    . ALPRAZolam (XANAX) 0.25 MG tablet Take one tab in am, one tab in afternoon, and two tabs at bedtime.    . Aspirin 81 MG EC tablet Take 81 mg by mouth daily.    . Calcium Carbonate-Vit D-Min (CALCIUM 1200 PO) Take 1 tablet by mouth 2 (two) times daily.    . Cholecalciferol (VITAMIN D-3 PO) Take 1 tablet by mouth daily.    Marland Kitchen diltiazem (CARDIZEM CD) 120 MG 24 hr capsule     . fluorometholone (FML) 0.1 % ophthalmic suspension     . furosemide (LASIX) 40 MG tablet     . ketorolac (ACULAR) 0.4 % SOLN     . loperamide (IMODIUM A-D) 2 MG tablet Take 2 mg by mouth 4 (four) times daily as needed. For diarrhea    . losartan (COZAAR) 50 MG tablet Take 50 mg by mouth daily.    . Multiple Vitamins-Minerals (PRESERVISION AREDS PO) Take by mouth daily.    Marland Kitchen ofloxacin (OCUFLOX) 0.3 % ophthalmic solution     . potassium chloride (K-DUR,KLOR-CON) 10 MEQ tablet Take 10 mEq by mouth daily.    . predniSONE (DELTASONE) 10 MG tablet Reported on 11/18/2015    . pregabalin (LYRICA) 50 MG capsule One po tid xone week then 2 tabs po tid 180 capsule 6  . Probiotic Product (PROBIOTIC PO) Take by mouth daily.    . propranolol (INDERAL) 80 MG tablet Take 80 mg by mouth 2 (two) times daily.    Marland Kitchen topiramate (TOPAMAX) 50 MG tablet One po bid xone week, then 2 tabs po bid 120 tablet 11  . traMADol (ULTRAM) 50 MG tablet Take 1 tablet (50 mg total) by mouth every 6 (six) hours as needed. 30 tablet 2  . venlafaxine XR (EFFEXOR  XR) 75 MG 24 hr capsule Take 1 capsule (75 mg total) by mouth daily with breakfast. 30 capsule 6   No current facility-administered medications for this visit.    PAST MEDICAL HISTORY: Past Medical History  Diagnosis Date  . Gallstones   . Esophageal reflux   . Osteoporosis   . Cancer [facility])     breast cancer  . Migraines   . Abdominal pain   . Nausea   . Confusion   . Depression   . Dementia     early stages per patient  . Irritable bowel   . Diverticulosis   . Hypertension     sees [name]  . Bronchitis   . Dementia     "early stages per patient" and long term memory loss  . Carpal tunnel syndrome     bilaterally  . Arthritis   . Cataracts, bilateral   . Complication of anesthesia     "difficutly waking up and hypothermia and  stopped breathing"  . PONV (postoperative nausea and vomiting)   . Anginal pain [facility])     sees [name]; reportedly negative stress/echo 2012  . Refusal of blood transfusions as patient is Jehovah's Witness     PAST SURGICAL HISTORY: Past Surgical History  Procedure Laterality Date  . Tubal ligation    . Esophagogastroduodenoscopy  08/30/11  . Colonoscopy  08/30/11  . Knee surgery    . Dilation and curettage of uterus    . Ankle surgery    . Skin graft    . Shoulder surgery  06/2011  . Wrist surgery  1995  . Joint replacement      knee - right  . Breast surgery      x 4  . Cardiovascular stress test    . Doppler echocardiography      hx   . Keloid excision      from bilateral earlobes  . Laparoscopic cholecystectomy w/ cholangiography  10/04/2012  . Ercp  10/05/2012    Procedure: ENDOSCOPIC RETROGRADE CHOLANGIOPANCREATOGRAPHY (ERCP);  Surgeon: Jeryl Columbia, MD;  Location: [facility] ENDOSCOPY;  Service: Endoscopy;  Laterality: N/A;  . Cholecystectomy  10/04/2012    Procedure: LAPAROSCOPIC CHOLECYSTECTOMY WITH INTRAOPERATIVE CHOLANGIOGRAM;  Surgeon: Adin Hector, MD;  Location: [place];  Service: General;  Laterality: N/A;   laparosopic cholecystectomy with cholangiogram and repair of umbilical hernia    FAMILY HISTORY: Family History  Problem Relation Age of Onset  . Colon polyps Mother     SOCIAL HISTORY:  Social History   Social History  . Marital Status: Widowed    Spouse Name: N/A  . Number of Children: 2  . Years of Education: 12   Occupational History  . Sales     Retired   Social History Main Topics  . Smoking status: Never Smoker   . Smokeless tobacco: Never Used  . Alcohol Use: No  . Drug Use: No  . Sexual Activity: Not on file   Other Topics Concern  . Not on file   Social History Narrative   Patient lives at home with her father and he is blind. Patient is retired. Patient is widowed and has two children.   Right handed.   Caffeine- None     PHYSICAL EXAM   Filed Vitals:   12/28/15 1611  BP: 108/62  Pulse: 60  Height: 5' 5.5" (1.664 m)  Weight: 207 lb (93.895 kg)    Not recorded      Body mass index is 33.91 kg/(m^2).  PHYSICAL EXAMNIATION:  Gen: NAD, conversant, well nourised, obese, well groomed                     Cardiovascular: Regular rate rhythm, no peripheral edema, warm, nontender. Eyes: Conjunctivae clear without exudates or hemorrhage Neck: Supple, no carotid bruise. Pulmonary: Clear to auscultation bilaterally   NEUROLOGICAL EXAM:  MENTAL STATUS: Speech:    Speech is normal; fluent and spontaneous with normal comprehension.  Cognition: MMSE 24 out of 30 animal naming 12     She is not oriented to date     Recent and remote memory: missed 1/3 recall.     Normal Attention span and concentration     Normal Language, naming, repeating,spontaneous speech     Fund of knowledge   CRANIAL NERVES: CN II: Visual fields are full to confrontation. Fundoscopic exam is normal with sharp discs and no vascular changes. Pupils are round equal and briskly reactive to light. She  has bilateral conjunctiva erythematous changes CN III, IV, VI: extraocular  movement are normal. No ptosis. CN V: Facial sensation is intact to pinprick in all 3 divisions bilaterally. Corneal responses are intact.  CN VII: Face is symmetric with normal eye closure and smile. CN VIII: Hearing is normal to rubbing fingers CN IX, X: Palate elevates symmetrically. Phonation is normal. CN XI: Head turning and shoulder shrug are intact CN XII: Tongue is midline with normal movements and no atrophy.  MOTOR: There is no pronator drift of out-stretched arms. Muscle bulk and tone are normal. Muscle strength is normal.  REFLEXES: Reflexes are hypoactive and symmetric at the biceps, triceps, knees, and ankles. Plantar responses are flexor.  SENSORY: Intact to light touch, pinprick, position sense, and vibration sense are intact in fingers and toes.  COORDINATION: Rapid alternating movements and fine finger movements are intact. There is no dysmetria on finger-to-nose and heel-knee-shin.    GAIT/STANCE: She needs the cane to get up from seated position, unsteady, scoliosis right knee valgrus   DIAGNOSTIC DATA (LABS, IMAGING, TESTING) - I reviewed patient records, labs, notes, testing and imaging myself where available.   ASSESSMENT AND PLAN  Zissel Biederman is a 75 y.o. female    Increased frequency of headaches, recent diagnosis of bilateral glaucoma, elevated C-reactive protein  Her bilateral eye pain has much improved with prednisone treatment, no recurrent headaches after tapering off prednisone  I would decrease Topamax to 50 mg every night with her complains of worsening memory trouble with higher dose of Topamax 100 mg twice a day    Mild cognitive impairment   Mini-Mental Status Examination is 23 out of 13 today  Strong family history of Alzheimer's dementia  Complete evaluation with MRI of the brain  Laboratory evaluation to rule out treatable causes  Namenda 10 mg twice a day  Depression anxiety  Continue Effexor  Gait difficulty  Due to bilateral  feet pain, low back pain, scoliosis, right knee pain deformity    Marcial Pacas, M.D. Ph.D.  [facility] [address] [place], [place] [address] Ph: [phone] Fax: [phone]  CC: Levin Erp, MD

## 2016-01-04 ENCOUNTER — Ambulatory Visit
Admission: RE | Admit: 2016-01-04 | Discharge: 2016-01-04 | Disposition: A | Discharge status: Home / Self Care | Payer: Medicare Other | Source: Ambulatory Visit | Attending: Neurology | Admitting: Neurology

## 2016-01-04 ENCOUNTER — Telehealth: Payer: Self-pay | Admitting: Dietician

## 2016-01-04 DIAGNOSIS — F0390 Unspecified dementia without behavioral disturbance: Secondary | ICD-10-CM

## 2016-01-04 DIAGNOSIS — F0391 Unspecified dementia with behavioral disturbance: Secondary | ICD-10-CM

## 2016-01-04 NOTE — Telephone Encounter (Signed)
Error

## 2016-01-05 ENCOUNTER — Telehealth: Payer: Self-pay | Admitting: *Deleted

## 2016-01-05 NOTE — Telephone Encounter (Signed)
-----   Message from Marcial Pacas, MD sent at 01/05/2016 10:11 AM EST ----- Please call pt for normal MRI brain.

## 2016-01-05 NOTE — Telephone Encounter (Signed)
Patient aware of results.  She will continue meds and keep her follow up appt.

## 2016-01-11 ENCOUNTER — Other Ambulatory Visit (INDEPENDENT_AMBULATORY_CARE_PROVIDER_SITE_OTHER): Payer: Self-pay

## 2016-01-11 DIAGNOSIS — Z0289 Encounter for other administrative examinations: Secondary | ICD-10-CM

## 2016-01-12 LAB — COMPREHENSIVE METABOLIC PANEL
ALT: 30 IU/L (ref 0–32)
AST: 25 IU/L (ref 0–40)
Albumin/Globulin Ratio: 1.2 (ref 1.1–2.5)
Albumin: 3.9 g/dL (ref 3.5–4.8)
Alkaline Phosphatase: 163 IU/L — ABNORMAL HIGH (ref 39–117)
BUN/Creatinine Ratio: 14 (ref 11–26)
BUN: 10 mg/dL (ref 8–27)
Bilirubin Total: 0.3 mg/dL (ref 0.0–1.2)
CO2: 16 mmol/L — ABNORMAL LOW (ref 18–29)
Calcium: 9.4 mg/dL (ref 8.7–10.3)
Chloride: 100 mmol/L (ref 96–106)
Creatinine, Ser: 0.73 mg/dL (ref 0.57–1.00)
GFR calc Af Amer: 94 mL/min/{1.73_m2} (ref >59)
GFR calc non Af Amer: 81 mL/min/{1.73_m2} (ref >59)
Globulin, Total: 3.2 g/dL (ref 1.5–4.5)
Glucose: 105 mg/dL — ABNORMAL HIGH (ref 65–99)
Potassium: 4.1 mmol/L (ref 3.5–5.2)
Sodium: 139 mmol/L (ref 134–144)
Total Protein: 7.1 g/dL (ref 6.0–8.5)

## 2016-01-12 LAB — CBC
Hematocrit: 39.2 % (ref 34.0–46.6)
Hemoglobin: 13 g/dL (ref 11.1–15.9)
MCH: 30.3 pg (ref 26.6–33.0)
MCHC: 33.2 g/dL (ref 31.5–35.7)
MCV: 91 fL (ref 79–97)
Platelets: 213 10*3/uL (ref 150–379)
RBC: 4.29 x10E6/uL (ref 3.77–5.28)
RDW: 13.9 % (ref 12.3–15.4)
WBC: 7 10*3/uL (ref 3.4–10.8)

## 2016-01-12 LAB — ANA W/REFLEX IF POSITIVE: Anti Nuclear Antibody(ANA): NEGATIVE

## 2016-01-12 LAB — SEDIMENTATION RATE: Sed Rate: 50 mm/hr — ABNORMAL HIGH (ref 0–40)

## 2016-01-12 LAB — VITAMIN B12: Vitamin B-12: 980 pg/mL — ABNORMAL HIGH (ref 211–946)

## 2016-01-12 LAB — TSH: TSH: 3.64 u[IU]/mL (ref 0.450–4.500)

## 2016-01-13 ENCOUNTER — Telehealth: Payer: Self-pay | Admitting: Neurology

## 2016-01-13 NOTE — Telephone Encounter (Signed)
Spoke to patient she is aware of results

## 2016-01-13 NOTE — Telephone Encounter (Signed)
Please call patient, lab evaluation showed mildly elevated ESR, which has unknown clinical significance, she also has mildly elevated alkaline phosphate on CMP, which present during previous laboratory evaluation as well, of unknown clinical significance, rest of the labs, B12, TSH, inflammatory markers were normal.

## 2016-03-14 ENCOUNTER — Emergency Department (HOSPITAL_COMMUNITY)
Admission: EM | Admit: 2016-03-14 | Discharge: 2016-03-15 | Disposition: A | Discharge status: Home / Self Care | Payer: Medicare Other | Attending: Emergency Medicine | Admitting: Emergency Medicine

## 2016-03-14 ENCOUNTER — Encounter (HOSPITAL_COMMUNITY): Payer: Self-pay | Admitting: Emergency Medicine

## 2016-03-14 DIAGNOSIS — M545 Low back pain: Secondary | ICD-10-CM | POA: Insufficient documentation

## 2016-03-14 DIAGNOSIS — Z7982 Long term (current) use of aspirin: Secondary | ICD-10-CM | POA: Diagnosis not present

## 2016-03-14 DIAGNOSIS — H269 Unspecified cataract: Secondary | ICD-10-CM | POA: Diagnosis not present

## 2016-03-14 DIAGNOSIS — Z79899 Other long term (current) drug therapy: Secondary | ICD-10-CM | POA: Diagnosis not present

## 2016-03-14 DIAGNOSIS — R110 Nausea: Secondary | ICD-10-CM | POA: Insufficient documentation

## 2016-03-14 DIAGNOSIS — R197 Diarrhea, unspecified: Secondary | ICD-10-CM | POA: Diagnosis not present

## 2016-03-14 DIAGNOSIS — G5603 Carpal tunnel syndrome, bilateral upper limbs: Secondary | ICD-10-CM | POA: Insufficient documentation

## 2016-03-14 DIAGNOSIS — K219 Gastro-esophageal reflux disease without esophagitis: Secondary | ICD-10-CM | POA: Diagnosis not present

## 2016-03-14 DIAGNOSIS — M1990 Unspecified osteoarthritis, unspecified site: Secondary | ICD-10-CM | POA: Diagnosis not present

## 2016-03-14 DIAGNOSIS — Z853 Personal history of malignant neoplasm of breast: Secondary | ICD-10-CM | POA: Diagnosis not present

## 2016-03-14 DIAGNOSIS — F0390 Unspecified dementia without behavioral disturbance: Secondary | ICD-10-CM | POA: Diagnosis not present

## 2016-03-14 DIAGNOSIS — R1030 Lower abdominal pain, unspecified: Secondary | ICD-10-CM | POA: Diagnosis not present

## 2016-03-14 DIAGNOSIS — I10 Essential (primary) hypertension: Secondary | ICD-10-CM | POA: Insufficient documentation

## 2016-03-14 HISTORY — DX: Irritable bowel syndrome, unspecified: K58.9

## 2016-03-14 LAB — URINALYSIS, ROUTINE W REFLEX MICROSCOPIC
Bilirubin Urine: NEGATIVE
Glucose, UA: NEGATIVE mg/dL
Hgb urine dipstick: NEGATIVE
Ketones, ur: NEGATIVE mg/dL
Nitrite: NEGATIVE
Protein, ur: NEGATIVE mg/dL
Specific Gravity, Urine: 1.006 (ref 1.005–1.030)
pH: 6.5 (ref 5.0–8.0)

## 2016-03-14 LAB — COMPREHENSIVE METABOLIC PANEL
ALT: 21 U/L (ref 14–54)
AST: 24 U/L (ref 15–41)
Albumin: 3.5 g/dL (ref 3.5–5.0)
Alkaline Phosphatase: 131 U/L — ABNORMAL HIGH (ref 38–126)
Anion gap: 10 (ref 5–15)
BUN: 6 mg/dL (ref 6–20)
CO2: 21 mmol/L — ABNORMAL LOW (ref 22–32)
Calcium: 9.3 mg/dL (ref 8.9–10.3)
Chloride: 107 mmol/L (ref 101–111)
Creatinine, Ser: 0.95 mg/dL (ref 0.44–1.00)
GFR calc Af Amer: >60 mL/min (ref >60)
GFR calc non Af Amer: 58 mL/min — ABNORMAL LOW (ref >60)
Glucose, Bld: 102 mg/dL — ABNORMAL HIGH (ref 65–99)
Potassium: 3.1 mmol/L — ABNORMAL LOW (ref 3.5–5.1)
Sodium: 138 mmol/L (ref 135–145)
Total Bilirubin: 0.5 mg/dL (ref 0.3–1.2)
Total Protein: 7.5 g/dL (ref 6.5–8.1)

## 2016-03-14 LAB — CBC
HCT: 40.9 % (ref 36.0–46.0)
Hemoglobin: 13.3 g/dL (ref 12.0–15.0)
MCH: 30.3 pg (ref 26.0–34.0)
MCHC: 32.5 g/dL (ref 30.0–36.0)
MCV: 93.2 fL (ref 78.0–100.0)
Platelets: 187 10*3/uL (ref 150–400)
RBC: 4.39 MIL/uL (ref 3.87–5.11)
RDW: 13.4 % (ref 11.5–15.5)
WBC: 7.2 10*3/uL (ref 4.0–10.5)

## 2016-03-14 LAB — URINE MICROSCOPIC-ADD ON

## 2016-03-14 LAB — LIPASE, BLOOD: Lipase: 21 U/L (ref 11–51)

## 2016-03-14 NOTE — ED Notes (Signed)
Pt. reports intermittent diarrhea for 3 weeks with mild nausea , denies emesis or fever , pt. stated history of IBS .

## 2016-03-14 NOTE — ED Provider Notes (Signed)
CSN: [id number]     Arrival date & time 03/14/16  1930 History   By signing my name below, I, Forrestine Him, attest that this documentation has been prepared under the direction and in the presence of Varney Biles, MD.  Electronically Signed: Forrestine Him, ED Scribe. 03/14/2016. 11:41 PM.   Chief Complaint  Patient presents with  . Diarrhea   The history is provided by the patient. No language interpreter was used.    HPI Comments: Zihan Garst is a 75 y.o. female with a PMHx of gallstones, breast cancer, IBS, diverticulosis, degenerative disc disease and HTN who presents to the Emergency Department complaining of intermittent, ongoing diarrhea x 3 weeks. Stool is described as loose and brown in color. She reports 4-5 episodes daily. Pt also reports ongoing, intemrittent nausea, upper back pain, and lower abdominal pain. She states "it feels like something is moving around in my stomach". No aggravating or alleviating factors at this time. OTC Imodium attempted prior to arrival without any improvement. No recent fever, chills, or vomiting. She denies any dysuria or hematuria. She denies any recent travel. No recent sick contacts. No recent antibiotic use or new medications in the last several weeks. No recent hospital visits. Pt denies any family history of colitis or related illnesses. PSHx includes Cholecystectomy in 2013.  PCP: Criselda Peaches, MD    Past Medical History  Diagnosis Date  . Gallstones   . Esophageal reflux   . Osteoporosis   . Cancer [facility])     breast cancer  . Migraines   . Abdominal pain   . Nausea   . Confusion   . Depression   . Dementia     early stages per patient  . Irritable bowel   . Diverticulosis   . Hypertension     sees [name]  . Bronchitis   . Dementia     "early stages per patient" and long term memory loss  . Carpal tunnel syndrome     bilaterally  . Arthritis   . Cataracts, bilateral   . Complication of anesthesia      "difficutly waking up and hypothermia and stopped breathing"  . PONV (postoperative nausea and vomiting)   . Anginal pain [facility])     sees [name]; reportedly negative stress/echo 2012  . Refusal of blood transfusions as patient is Jehovah's Witness   . IBS (irritable bowel syndrome)    Past Surgical History  Procedure Laterality Date  . Tubal ligation    . Esophagogastroduodenoscopy  08/30/11  . Colonoscopy  08/30/11  . Knee surgery    . Dilation and curettage of uterus    . Ankle surgery    . Skin graft    . Shoulder surgery  06/2011  . Wrist surgery  1995  . Joint replacement      knee - right  . Breast surgery      x 4  . Cardiovascular stress test    . Doppler echocardiography      hx   . Keloid excision      from bilateral earlobes  . Laparoscopic cholecystectomy w/ cholangiography  10/04/2012  . Ercp  10/05/2012    Procedure: ENDOSCOPIC RETROGRADE CHOLANGIOPANCREATOGRAPHY (ERCP);  Surgeon: Jeryl Columbia, MD;  Location: [facility] ENDOSCOPY;  Service: Endoscopy;  Laterality: N/A;  . Cholecystectomy  10/04/2012    Procedure: LAPAROSCOPIC CHOLECYSTECTOMY WITH INTRAOPERATIVE CHOLANGIOGRAM;  Surgeon: Adin Hector, MD;  Location: [place];  Service: General;  Laterality: N/A;  laparosopic cholecystectomy with cholangiogram and repair of umbilical hernia   Family History  Problem Relation Age of Onset  . Colon polyps Mother    Social History  Substance Use Topics  . Smoking status: Never Smoker   . Smokeless tobacco: Never Used  . Alcohol Use: No   OB History    No data available     Review of Systems  Constitutional: Negative for fever and chills.  Respiratory: Negative for cough and shortness of breath.   Cardiovascular: Negative for chest pain.  Gastrointestinal: Positive for nausea, abdominal pain and diarrhea. Negative for vomiting.  Genitourinary: Negative for dysuria and hematuria.  Musculoskeletal: Positive for back pain.  Neurological: Negative for headaches.   Psychiatric/Behavioral: Negative for confusion.  All other systems reviewed and are negative.     Allergies  Plavix; Warfarin and related; and Adhesive  Home Medications   Prior to Admission medications   Medication Sig Start Date End Date Taking? Authorizing Provider  Acetaminophen (TYLENOL PO) Take by mouth as needed.    Historical Provider, MD  ALPRAZolam Duanne Moron) 0.25 MG tablet Take one tab in am, one tab in afternoon, and two tabs at bedtime. 09/08/15   Historical Provider, MD  Aspirin 81 MG EC tablet Take 81 mg by mouth daily.    Historical Provider, MD  Calcium Carbonate-Vit D-Min (CALCIUM 1200 PO) Take 1 tablet by mouth 2 (two) times daily.    Historical Provider, MD  Cholecalciferol (VITAMIN D-3 PO) Take 1 tablet by mouth daily.    Historical Provider, MD  diltiazem (CARDIZEM CD) 120 MG 24 hr capsule  05/13/14   Historical Provider, MD  diphenoxylate-atropine (LOMOTIL) 2.5-0.025 MG tablet Take 2 tablets by mouth 4 (four) times daily as needed for diarrhea or loose stools. 03/15/16   Varney Biles, MD  fluorometholone (FML) 0.1 % ophthalmic suspension  11/01/15   Historical Provider, MD  furosemide (LASIX) 40 MG tablet  09/18/15   Historical Provider, MD  ketorolac (ACULAR) 0.4 % SOLN  10/30/15   Historical Provider, MD  loperamide (IMODIUM A-D) 2 MG tablet Take 2 mg by mouth 4 (four) times daily as needed. For diarrhea    Historical Provider, MD  losartan (COZAAR) 50 MG tablet Take 50 mg by mouth daily.    Historical Provider, MD  memantine (NAMENDA) 10 MG tablet Take 1 tablet (10 mg total) by mouth 2 (two) times daily. 12/28/15   Marcial Pacas, MD  Multiple Vitamins-Minerals (PRESERVISION AREDS PO) Take by mouth daily.    Historical Provider, MD  ofloxacin (OCUFLOX) 0.3 % ophthalmic solution  10/30/15   Historical Provider, MD  potassium chloride (K-DUR,KLOR-CON) 10 MEQ tablet Take 10 mEq by mouth daily.    Historical Provider, MD  pregabalin (LYRICA) 50 MG capsule One po tid xone  week then 2 tabs po tid 11/18/15   Marcial Pacas, MD  Probiotic Product (PROBIOTIC PO) Take by mouth daily.    Historical Provider, MD  propranolol (INDERAL) 80 MG tablet Take 80 mg by mouth 2 (two) times daily.    Historical Provider, MD  topiramate (TOPAMAX) 50 MG tablet One tab po qhs 12/28/15   Marcial Pacas, MD  traMADol (ULTRAM) 50 MG tablet Take 1 tablet (50 mg total) by mouth every 6 (six) hours as needed. 11/01/15   Marcial Pacas, MD  venlafaxine XR (EFFEXOR XR) 75 MG 24 hr capsule Take 1 capsule (75 mg total) by mouth daily with breakfast. 07/09/14   Drema Dallas, DO   Triage Vitals: BP  124/111 mmHg  Pulse 77  Temp(Src) 98.4 F (36.9 C) (Oral)  Resp 22  Ht 5\' 6"  (1.676 m)  Wt 193 lb 3 oz (87.629 kg)  BMI 31.20 kg/m2  SpO2 100%   Physical Exam  Constitutional: She is oriented to person, place, and time. She appears well-developed and well-nourished. No distress.  HENT:  Head: Normocephalic and atraumatic.  Eyes: EOM are normal.  Neck: Normal range of motion.  Cardiovascular: Normal rate, regular rhythm and normal heart sounds.   Pulmonary/Chest: Effort normal and breath sounds normal.  Abdominal: Soft. She exhibits no distension. There is tenderness.  Positive bowel sounds but not hyperactive  L sided and lower quadrant abdominal tenderness   Musculoskeletal: Normal range of motion.  Neurological: She is alert and oriented to person, place, and time.  Skin: Skin is warm and dry.  Psychiatric: She has a normal mood and affect. Judgment normal.  Nursing note and vitals reviewed.   ED Course  Procedures (including critical care time)  DIAGNOSTIC STUDIES: Oxygen Saturation is 100% on RA, Normal by my interpretation.    COORDINATION OF CARE: 11:33 PM- Will order blood work, and urinalysis. Discussed treatment plan with pt at bedside and pt agreed to plan.     Labs Review Labs Reviewed  COMPREHENSIVE METABOLIC PANEL - Abnormal; Notable for the following:    Potassium 3.1 (*)     CO2 21 (*)    Glucose, Bld 102 (*)    Alkaline Phosphatase 131 (*)    GFR calc non Af Amer 58 (*)    All other components within normal limits  URINALYSIS, ROUTINE W REFLEX MICROSCOPIC (NOT AT [facility]) - Abnormal; Notable for the following:    APPearance CLOUDY (*)    Leukocytes, UA LARGE (*)    All other components within normal limits  URINE MICROSCOPIC-ADD ON - Abnormal; Notable for the following:    Squamous Epithelial / LPF 6-30 (*)    Bacteria, UA FEW (*)    All other components within normal limits  LIPASE, BLOOD  CBC    Imaging Review No results found. I have personally reviewed and evaluated these images and lab results as part of my medical decision-making.   EKG Interpretation None      MDM   Final diagnoses:  Diarrhea, unspecified type    I personally performed the services described in this documentation, which was scribed in my presence. The recorded information has been reviewed and is accurate.  Pt comes in with cc of diarrhea. PT has had diarrhea for several days.  No fevers, no blood. Generalized abd tenderness, no peritoneal signs. Labs are reassuring.  Pt's diarrhea is worse at night time. Appears secretory/malabsorption or inflammatory etiology. Will advise f/u with PCP.    Varney Biles, MD 03/15/16 0100

## 2016-03-15 MED ORDER — DIPHENOXYLATE-ATROPINE 2.5-0.025 MG PO TABS: 2.0000 | ORAL_TABLET | Freq: Four times a day (QID) | ORAL | Status: DC | PRN

## 2016-03-15 NOTE — Discharge Instructions (Signed)
You need to see your primary care doctor. We are prescribing you this medicine for diarrhea that might help.   Diarrhea Diarrhea is frequent loose and watery bowel movements. It can cause you to feel weak and dehydrated. Dehydration can cause you to become tired and thirsty, have a dry mouth, and have decreased urination that often is dark yellow. Diarrhea is a sign of another problem, most often an infection that will not last long. In most cases, diarrhea typically lasts 2-3 days. However, it can last longer if it is a sign of something more serious. It is important to treat your diarrhea as directed by your caregiver to lessen or prevent future episodes of diarrhea. CAUSES  Some common causes include:  Gastrointestinal infections caused by viruses, bacteria, or parasites.  Food poisoning or food allergies.  Certain medicines, such as antibiotics, chemotherapy, and laxatives.  Artificial sweeteners and fructose.  Digestive disorders. HOME CARE INSTRUCTIONS  Ensure adequate fluid intake (hydration): Have 1 cup (8 oz) of fluid for each diarrhea episode. Avoid fluids that contain simple sugars or sports drinks, fruit juices, whole milk products, and sodas. Your urine should be clear or pale yellow if you are drinking enough fluids. Hydrate with an oral rehydration solution that you can purchase at pharmacies, retail stores, and online. You can prepare an oral rehydration solution at home by mixing the following ingredients together:   - tsp table salt.   tsp baking soda.   tsp salt substitute containing potassium chloride.  1  tablespoons sugar.  1 L (34 oz) of water.  Certain foods and beverages may increase the speed at which food moves through the gastrointestinal (GI) tract. These foods and beverages should be avoided and include:  Caffeinated and alcoholic beverages.  High-fiber foods, such as raw fruits and vegetables, nuts, seeds, and whole grain breads and  cereals.  Foods and beverages sweetened with sugar alcohols, such as xylitol, sorbitol, and mannitol.  Some foods may be well tolerated and may help thicken stool including:  Starchy foods, such as rice, toast, pasta, low-sugar cereal, oatmeal, grits, baked potatoes, crackers, and bagels.  Bananas.  Applesauce.  Add probiotic-rich foods to help increase healthy bacteria in the GI tract, such as yogurt and fermented milk products.  Wash your hands well after each diarrhea episode.  Only take over-the-counter or prescription medicines as directed by your caregiver.  Take a warm bath to relieve any burning or pain from frequent diarrhea episodes. SEEK IMMEDIATE MEDICAL CARE IF:   You are unable to keep fluids down.  You have persistent vomiting.  You have blood in your stool, or your stools are black and tarry.  You do not urinate in 6-8 hours, or there is only a small amount of very dark urine.  You have abdominal pain that increases or localizes.  You have weakness, dizziness, confusion, or light-headedness.  You have a severe headache.  Your diarrhea gets worse or does not get better.  You have a fever or persistent symptoms for more than 2-3 days.  You have a fever and your symptoms suddenly get worse. MAKE SURE YOU:   Understand these instructions.  Will watch your condition.  Will get help right away if you are not doing well or get worse.   This information is not intended to replace advice given to you by your health care provider. Make sure you discuss any questions you have with your health care provider.   Document Released: 10/27/2002 Document Revised: 11/27/2014 Document  Reviewed: 07/14/2012 Elsevier Interactive Patient Education [employer].

## 2016-03-27 ENCOUNTER — Encounter: Payer: Self-pay | Admitting: Neurology

## 2016-03-27 ENCOUNTER — Ambulatory Visit (INDEPENDENT_AMBULATORY_CARE_PROVIDER_SITE_OTHER): Payer: Medicare Other | Admitting: Neurology

## 2016-03-27 VITALS — BP 125/73 | HR 62 | Ht 66.0 in | Wt 196.5 lb

## 2016-03-27 DIAGNOSIS — G3184 Mild cognitive impairment, so stated: Secondary | ICD-10-CM | POA: Diagnosis not present

## 2016-03-27 DIAGNOSIS — M791 Myalgia, unspecified site: Secondary | ICD-10-CM

## 2016-03-27 DIAGNOSIS — R51 Headache: Secondary | ICD-10-CM

## 2016-03-27 DIAGNOSIS — R519 Headache, unspecified: Secondary | ICD-10-CM

## 2016-03-27 DIAGNOSIS — R269 Unspecified abnormalities of gait and mobility: Secondary | ICD-10-CM | POA: Diagnosis not present

## 2016-03-27 MED ORDER — DULOXETINE HCL 60 MG PO CPEP: 60.0000 mg | ORAL_CAPSULE | Freq: Every day | ORAL | Status: DC

## 2016-03-27 NOTE — Progress Notes (Signed)
Chief Complaint  Patient presents with  . Memory Loss    MMSE 28/30 - 12 animals. Reports memory to be the same - has good and bad days.  She had to move apartments and never started the Namenda that was prescribed at her last visit.  Says she will pick up the medication today.  She would like to review her MRI and lab results.         PATIENT: Ashley Hanson DOB: [birth year]  Chief Complaint  Patient presents with  . Memory Loss    MMSE 28/30 - 12 animals. Reports memory to be the same - has good and bad days.  She had to move apartments and never started the Namenda that was prescribed at her last visit.  Says she will pick up the medication today.  She would like to review her MRI and lab results.     HISTORICAL  Ashley Hanson is a 75 years old right-handed female, last clinical visit was with Dr. Jim Like August 2015 previously a patient of Dr. Erling Cruz  She had a history of chronic ocular migraine the past, presented with colorful visual distortion, oftentimes followed by retro-orbital area headaches, when she was younger she has headaches frequently, sometimes the point of passing out, her headache has much improved, especially while take gabapentin 600 mg twice a day, since September 2016, she began to have frequent almost daily pressure behind both eyes, also complains of decreased vision, she has tried over-the-counter ibuprofen Tylenol did not work for her,  She had a history of macular degeneration with decreased vision to begin with, also  had bilateral cataract surgery, recent diagnosis of glaucoma by ophthalmologist Dr. Carolynn Sayers  She complains of many years history of gradual onset memory trouble, she retired as a Therapist, occupational, she still able to drive, lives in apartment by herself, manage her whole own finances,  Gait difficulty due to bilateral feet pain, scoliosis, chronic low back pain.  UPDATE Nov 01 2015: I reviewed a note from her ophthalmologist Dr. Katy Fitch dated October 28 2015, patient complains of visual distortions of shapes and sizes, movement of visual imagings, Funduscopy examination showed normal appearance of optic nerve with healthy pink cream and normal appearance nerve fiber layers, macular showed dry age-related macular degeneration, vessels are normal,  vitreous was normal, The conclusion was suspected glaucoma, OD was 12, OS was 14, reported that her father had a history of glaucoma  She was put on 3 eye drops, she has not started yet, she also had bilateral conjunctiva erythematous change, was diagnosed with recurrent iridocystitis  Previous laboratory evaluation showed elevated C reactive protein of 21, with normal ESR,  She wear sunglasses, she complains of light sensitive, she continues both retro-orbital area pressure pain, constant 9/10, x24 hours. Topamax 151m bid did not help her headaches  UPDATE Nov 18 2015: Her headache and conjunctiva erythematous has much improved, everytime she took Fioricet, she had GI side effect, feel sick, band like sensation. She was started on prednisone since December twelfth 2016, started from 20 mg daily, now 10 milligrams daily, she can tolerate the medication well, which has helped her headaches, she is also on higher dose of Topamax 100 mg twice a day,  Gabapentin up to 600 mg twice a day did not help her headaches, or her bilateral lower extremity paresthesia pain, will change to Lyrica today,  Previous laboratory showed elevated C reactive protein up to 20.9, her headache has much improved with prednisone, likely a  component of inflammatory process  UPDATE Dec 28 2015: Her headache, and eye pain has much improved, with no recurrent headache after tapering off prednisone, she is now taking Topamax 100 mg twice a day, she is complaining of increased memory trouble, difficulty focusing, She lives by herself since 02-15-2010 when her husband passed away, she graduated from high school, used to work as a Research scientist (physical sciences), retired around age 17, she has strong family history of Alzheimer's disease, her maternal grandmother, mother, maternal aunt, sister suffered Alzheimer's disease  UPDATE May 8th 2017: I reviewed laboratory evaluation, she has elevated ESR of 50, previous elevated C reactive protein up to 21 in Nov 2016, frequent headaches, responded very well to prednison, she is off steroid now, has no recurrent headache,   I have talked about potential temporal artery biopsy to rule out temporal arteritis, she adamantly refused, she suffered severe keloid, with associated neuropathic pain, She is taking Topamax 50 mg twice a day, overall tolerating it well, she no longer has frequent migraine headaches,  She complains of diffuse body achy pain multiple joints pain, neuropathic pain around her keloid, worsening scoliosis, and worsening gait difficulty  We have personally reviewed MRI of the brain without contrast in April 2017, there was no significant abnormality   REVIEW OF SYSTEMS: Full 14 system review of systems performed and notable only for sleepiness, slurred speech, anxiety, not enough sleep, feeling hot, weight gain  ALLERGIES: Allergies  Allergen Reactions  . Plavix [Clopidogrel Bisulfate] Itching and Other (See Comments)    Causes migraines. Itching all over the body.  . Warfarin And Related Other (See Comments)    Blood thinners - cause migraines in patient.  . Adhesive [Tape] Other (See Comments)    Severe irritation of skin    HOME MEDICATIONS: Current Outpatient Prescriptions  Medication Sig Dispense Refill  . Acetaminophen (TYLENOL PO) Take by mouth as needed.    . ALPRAZolam (XANAX) 0.25 MG tablet Take one tab in am, one tab in afternoon, and two tabs at bedtime.    . Aspirin 81 MG EC tablet Take 81 mg by mouth daily.    . Calcium Carbonate-Vit D-Min (CALCIUM 1200 PO) Take 1 tablet by mouth 2 (two) times daily.    . Cholecalciferol (VITAMIN D-3 PO) Take 1 tablet by mouth  daily.    Marland Kitchen diltiazem (CARDIZEM CD) 120 MG 24 hr capsule     . diphenoxylate-atropine (LOMOTIL) 2.5-0.025 MG tablet Take 2 tablets by mouth 4 (four) times daily as needed for diarrhea or loose stools. 40 tablet 0  . fluorometholone (FML) 0.1 % ophthalmic suspension     . furosemide (LASIX) 40 MG tablet     . ketorolac (ACULAR) 0.4 % SOLN     . loperamide (IMODIUM A-D) 2 MG tablet Take 2 mg by mouth 4 (four) times daily as needed. For diarrhea    . losartan (COZAAR) 50 MG tablet Take 50 mg by mouth daily.    . memantine (NAMENDA) 10 MG tablet Take 1 tablet (10 mg total) by mouth 2 (two) times daily. 60 tablet 11  . Multiple Vitamins-Minerals (PRESERVISION AREDS PO) Take by mouth daily.    Marland Kitchen ofloxacin (OCUFLOX) 0.3 % ophthalmic solution     . potassium chloride (K-DUR,KLOR-CON) 10 MEQ tablet Take 10 mEq by mouth daily.    . pregabalin (LYRICA) 50 MG capsule One po tid xone week then 2 tabs po tid 180 capsule 6  . Probiotic Product (PROBIOTIC PO) Take by  mouth daily.    . propranolol (INDERAL) 80 MG tablet Take 80 mg by mouth 2 (two) times daily.    Marland Kitchen topiramate (TOPAMAX) 50 MG tablet One tab po qhs 30 tablet 11  . traMADol (ULTRAM) 50 MG tablet Take 1 tablet (50 mg total) by mouth every 6 (six) hours as needed. 30 tablet 2  . venlafaxine XR (EFFEXOR XR) 75 MG 24 hr capsule Take 1 capsule (75 mg total) by mouth daily with breakfast. 30 capsule 6   No current facility-administered medications for this visit.    PAST MEDICAL HISTORY: Past Medical History  Diagnosis Date  . Gallstones   . Esophageal reflux   . Osteoporosis   . Cancer [facility])     breast cancer  . Migraines   . Abdominal pain   . Nausea   . Confusion   . Depression   . Dementia     early stages per patient  . Irritable bowel   . Diverticulosis   . Hypertension     sees [name]  . Bronchitis   . Dementia     "early stages per patient" and long term memory loss  . Carpal tunnel syndrome     bilaterally  .  Arthritis   . Cataracts, bilateral   . Complication of anesthesia     "difficutly waking up and hypothermia and stopped breathing"  . PONV (postoperative nausea and vomiting)   . Anginal pain [facility])     sees [name]; reportedly negative stress/echo 2012  . Refusal of blood transfusions as patient is Jehovah's Witness   . IBS (irritable bowel syndrome)     PAST SURGICAL HISTORY: Past Surgical History  Procedure Laterality Date  . Tubal ligation    . Esophagogastroduodenoscopy  08/30/11  . Colonoscopy  08/30/11  . Knee surgery    . Dilation and curettage of uterus    . Ankle surgery    . Skin graft    . Shoulder surgery  06/2011  . Wrist surgery  1995  . Joint replacement      knee - right  . Breast surgery      x 4  . Cardiovascular stress test    . Doppler echocardiography      hx   . Keloid excision      from bilateral earlobes  . Laparoscopic cholecystectomy w/ cholangiography  10/04/2012  . Ercp  10/05/2012    Procedure: ENDOSCOPIC RETROGRADE CHOLANGIOPANCREATOGRAPHY (ERCP);  Surgeon: Jeryl Columbia, MD;  Location: [facility] ENDOSCOPY;  Service: Endoscopy;  Laterality: N/A;  . Cholecystectomy  10/04/2012    Procedure: LAPAROSCOPIC CHOLECYSTECTOMY WITH INTRAOPERATIVE CHOLANGIOGRAM;  Surgeon: Adin Hector, MD;  Location: [place];  Service: General;  Laterality: N/A;  laparosopic cholecystectomy with cholangiogram and repair of umbilical hernia    FAMILY HISTORY: Family History  Problem Relation Age of Onset  . Colon polyps Mother     SOCIAL HISTORY:  Social History   Social History  . Marital Status: Widowed    Spouse Name: N/A  . Number of Children: 2  . Years of Education: 12   Occupational History  . Sales     Retired   Social History Main Topics  . Smoking status: Never Smoker   . Smokeless tobacco: Never Used  . Alcohol Use: No  . Drug Use: No  . Sexual Activity: Not on file   Other Topics Concern  . Not on file   Social History Narrative    Patient  lives at home with her father and he is blind. Patient is retired. Patient is widowed and has two children.   Right handed.   Caffeine- None     PHYSICAL EXAM   Filed Vitals:   03/27/16 1632  BP: 125/73  Pulse: 62  Height: 5' 6"  (1.676 m)  Weight: 196 lb 8 oz (89.132 kg)    Not recorded      Body mass index is 31.73 kg/(m^2).  PHYSICAL EXAMNIATION:  Gen: NAD, conversant, well nourised, obese, well groomed                     Cardiovascular: Regular rate rhythm, no peripheral edema, warm, nontender. Eyes: Conjunctivae clear without exudates or hemorrhage Neck: Supple, no carotid bruise. Pulmonary: Clear to auscultation bilaterally  Skin: She has multiple keloid,  NEUROLOGICAL EXAM:  MENTAL STATUS: Speech:    Speech is normal; fluent and spontaneous with normal comprehension.  Cognition: MMSE 28 out of 30 animal naming 12     She is not oriented to season     Recent and remote memory: missed 1/3 recall.     Normal Attention span and concentration     Normal Language, naming, repeating,spontaneous speech     Fund of knowledge   CRANIAL NERVES: CN II: Visual fields are full to confrontation. Fundoscopic exam is normal with sharp discs and no vascular changes. Pupils are round equal and briskly reactive to light. She has bilateral conjunctiva erythematous changes CN III, IV, VI: extraocular movement are normal. No ptosis. CN V: Facial sensation is intact to pinprick in all 3 divisions bilaterally. Corneal responses are intact.  CN VII: Face is symmetric with normal eye closure and smile. CN VIII: Hearing is normal to rubbing fingers CN IX, X: Palate elevates symmetrically. Phonation is normal. CN XI: Head turning and shoulder shrug are intact CN XII: Tongue is midline with normal movements and no atrophy.  MOTOR: There is no pronator drift of out-stretched arms. Muscle bulk and tone are normal. Muscle strength is normal.  REFLEXES: Reflexes are hypoactive  and symmetric at the biceps, triceps, knees, and ankles. Plantar responses are flexor.  SENSORY: Length dependent decreased to light touch, pinprick, and vibratory sensations to distal shin   Coordination: are intact. There is no dysmetria on finger-to-nose and heel-knee-shin.    GAIT/STANCE: She needs the cane to get up from seated position, unsteady, scoliosis right knee valgrus   DIAGNOSTIC DATA (LABS, IMAGING, TESTING) - I reviewed patient records, labs, notes, testing and imaging myself where available.   ASSESSMENT AND PLAN  Ashley Hanson is a 75 y.o. female    Increased frequency of headaches, recent diagnosis of bilateral glaucoma, elevated C-reactive protein  Her bilateral eye pain has much improved with prednisone treatment, no recurrent headaches after tapering off prednisone  She responded where well to prednisone dose, there was evidence of elevated C-reactive protein, ESR, likely a component of inflammatory changes,    she adamantly refused temporal artery biopsy, with history of severe keloid, with pain surrounding her keloid  Her headache has been much improved with Topamax 50 mg twice a day   Mild cognitive impairment   Mini-Mental Status Examination is 28 out of 30 today  Strong family history of Alzheimer's dementia  MRI of the brain showed no significant abnormality  Laboratory evaluation showed no treatable causes  Continue Namenda 10 mg twice a day   Diffuse body achy pain  Will try Cymbalta 60 mg daily  Gait  difficulty  Due to bilateral feet pain, low back pain, scoliosis, right knee pain deformity    Marcial Pacas, M.D. Ph.D.  [facility] [address] [place], [place] [address] Ph: [phone] Fax: [phone]  CC: Levin Erp, MD

## 2016-03-31 ENCOUNTER — Other Ambulatory Visit: Payer: Self-pay | Admitting: Neurology

## 2016-03-31 NOTE — Telephone Encounter (Signed)
Patient requesting refill of .traMADol (ULTRAM) 50 MG tablet  Pharmacy: [facility] has 2 tablets left

## 2016-09-27 ENCOUNTER — Ambulatory Visit (INDEPENDENT_AMBULATORY_CARE_PROVIDER_SITE_OTHER): Payer: Medicare Other | Admitting: Neurology

## 2016-09-27 ENCOUNTER — Encounter: Payer: Self-pay | Admitting: Neurology

## 2016-09-27 VITALS — BP 162/76 | HR 64 | Resp 16 | Ht 66.0 in | Wt 190.5 lb

## 2016-09-27 DIAGNOSIS — G3184 Mild cognitive impairment, so stated: Secondary | ICD-10-CM

## 2016-09-27 DIAGNOSIS — R269 Unspecified abnormalities of gait and mobility: Secondary | ICD-10-CM

## 2016-09-27 DIAGNOSIS — IMO0002 Reserved for concepts with insufficient information to code with codable children: Secondary | ICD-10-CM

## 2016-09-27 DIAGNOSIS — G43709 Chronic migraine without aura, not intractable, without status migrainosus: Secondary | ICD-10-CM

## 2016-09-27 MED ORDER — DONEPEZIL HCL 10 MG PO TABS: 10.0000 mg | ORAL_TABLET | Freq: Every day | ORAL | 11 refills | Status: DC

## 2016-09-27 MED ORDER — MEMANTINE HCL 10 MG PO TABS: 10.0000 mg | ORAL_TABLET | Freq: Two times a day (BID) | ORAL | 4 refills | Status: DC

## 2016-09-27 NOTE — Progress Notes (Addendum)
Chief Complaint  Patient presents with  . Memory Loss    MMSE 29/30 today.  Was 28/30 at last ov.  Sts. she is tolerating both Namenda and Cymbalta well.  Sts. she is still having pain in hips, back, knees, feet/fim         PATIENT: Ashley Hanson DOB: March 13, 1941  Chief Complaint  Patient presents with  . Memory Loss    MMSE 29/30 today.  Was 28/30 at last ov.  Sts. she is tolerating both Namenda and Cymbalta well.  Sts. she is still having pain in hips, back, knees, feet/fim     HISTORICAL  Ashley Hanson is a 75 years old right-handed female, last clinical visit was with Dr. Jim Like August 2015 previously a patient of Dr. Erling Cruz  She had a history of chronic ocular migraine the past, presented with colorful visual distortion, oftentimes followed by retro-orbital area headaches, when she was younger she has headaches frequently, sometimes the point of passing out, her headache has much improved, especially while take gabapentin 600 mg twice a day, since September 2016, she began to have frequent almost daily pressure behind both eyes, also complains of decreased vision, she has tried over-the-counter ibuprofen Tylenol did not work for her,  She had a history of macular degeneration with decreased vision to begin with, also  had bilateral cataract surgery, recent diagnosis of glaucoma by ophthalmologist Dr. Carolynn Sayers  She complains of many years history of gradual onset memory trouble, she retired as a Therapist, occupational, she still able to drive, lives in apartment by herself, manage her whole own finances,  Gait difficulty due to bilateral feet pain, scoliosis, chronic low back pain.  UPDATE Nov 01 2015: I reviewed a note from her ophthalmologist Dr. Katy Fitch dated October 28 2015, patient complains of visual distortions of shapes and sizes, movement of visual imagings, Funduscopy examination showed normal appearance of optic nerve with healthy pink cream and normal appearance nerve fiber layers,  macular showed dry age-related macular degeneration, vessels are normal,  vitreous was normal, The conclusion was suspected glaucoma, OD was 12, OS was 14, reported that her father had a history of glaucoma  She was put on 3 eye drops, she has not started yet, she also had bilateral conjunctiva erythematous change, was diagnosed with recurrent iridocystitis  Previous laboratory evaluation showed elevated C reactive protein of 21, with normal ESR,  She wear sunglasses, she complains of light sensitive, she continues both retro-orbital area pressure pain, constant 9/10, x24 hours. Topamax 175m bid did not help her headaches  UPDATE Nov 18 2015: Her headache and conjunctiva erythematous has much improved, everytime she took Fioricet, she had GI side effect, feel sick, band like sensation. She was started on prednisone since December twelfth 2016, started from 20 mg daily, now 10 milligrams daily, she can tolerate the medication well, which has helped her headaches, she is also on higher dose of Topamax 100 mg twice a day,  Gabapentin up to 600 mg twice a day did not help her headaches, or her bilateral lower extremity paresthesia pain, will change to Lyrica today,  Previous laboratory showed elevated C reactive protein up to 20.9, her headache has much improved with prednisone, likely a component of inflammatory process  UPDATE Dec 28 2015: Her headache, and eye pain has much improved, with no recurrent headache after tapering off prednisone, she is now taking Topamax 100 mg twice a day, she is complaining of increased memory trouble, difficulty focusing, She lives by herself  since March 2011 when her husband passed away, she graduated from high school, used to work as a Museum/gallery curator, retired around age 83, she has strong family history of Alzheimer's disease, her maternal grandmother, mother, maternal aunt, sister suffered Alzheimer's disease  UPDATE May 8th 2017: I reviewed laboratory evaluation,  she has elevated ESR of 50, previous elevated C reactive protein up to 21 in Nov 2016, frequent headaches, responded very well to prednison, she is off steroid now, has no recurrent headache,   I have talked about potential temporal artery biopsy to rule out temporal arteritis, she adamantly refused, she suffered severe keloid, with associated neuropathic pain, She is taking Topamax 50 mg twice a day, overall tolerating it well, she no longer has frequent migraine headaches,  She complains of diffuse body achy pain multiple joints pain, neuropathic pain around her keloid, worsening scoliosis, and worsening gait difficulty  We have personally reviewed MRI of the brain without contrast in April 2017, there was no significant abnormality  UPDATE Nov 8th 2017: She is accompanied by her daughter at today's clinical visit, she reported recent onset of left side headache, left eye achy pain, overall has improved, today's Mini-Mental status examination 29/30  We have personally reviewed MRI brain in February 2017, generalized atrophy mostly at the left parasylvian fissure, slight supratentorium small vessel disease. She is able to tolerate Namenda 10 mg twice a day, Cymbalta 60 mg well, she lives by herself, just sold her car, is in the process of get scat service, continue have diffuse body joints pain, gait abnormality   REVIEW OF SYSTEMS: Full 14 system review of systems performed and notable only for ringing ears, runny nose, eye charge, eye redness, light sensitivity, choking, constipation, frequent urination, back pain, walking difficulty, her headache, numbness, speech difficulty, confusion, depression   ALLERGIES: Allergies  Allergen Reactions  . Plavix [Clopidogrel Bisulfate] Itching and Other (See Comments)    Causes migraines. Itching all over the body.  . Warfarin And Related Other (See Comments)    Blood thinners - cause migraines in patient.  . Adhesive [Tape] Other (See Comments)     Severe irritation of skin    HOME MEDICATIONS: Current Outpatient Prescriptions  Medication Sig Dispense Refill  . Acetaminophen (TYLENOL PO) Take by mouth as needed.    . Aspirin 81 MG EC tablet Take 81 mg by mouth daily.    . Calcium Carbonate-Vit D-Min (CALCIUM 1200 PO) Take 1 tablet by mouth 2 (two) times daily.    . Cholecalciferol (VITAMIN D-3 PO) Take 1 tablet by mouth daily.    Marland Kitchen diltiazem (CARDIZEM CD) 120 MG 24 hr capsule     . diphenoxylate-atropine (LOMOTIL) 2.5-0.025 MG tablet Take 2 tablets by mouth 4 (four) times daily as needed for diarrhea or loose stools. 40 tablet 0  . DULoxetine (CYMBALTA) 60 MG capsule Take 1 capsule (60 mg total) by mouth daily. 30 capsule 12  . fluorometholone (FML) 0.1 % ophthalmic suspension     . furosemide (LASIX) 40 MG tablet     . ketorolac (ACULAR) 0.4 % SOLN     . loperamide (IMODIUM A-D) 2 MG tablet Take 2 mg by mouth 4 (four) times daily as needed. For diarrhea    . losartan (COZAAR) 50 MG tablet Take 50 mg by mouth daily.    . memantine (NAMENDA) 10 MG tablet Take 1 tablet (10 mg total) by mouth 2 (two) times daily. 60 tablet 11  . Multiple Vitamins-Minerals (PRESERVISION AREDS PO) Take  by mouth daily.    Marland Kitchen ofloxacin (OCUFLOX) 0.3 % ophthalmic solution     . potassium chloride (K-DUR,KLOR-CON) 10 MEQ tablet Take 10 mEq by mouth daily.    . pregabalin (LYRICA) 50 MG capsule One po tid xone week then 2 tabs po tid 180 capsule 6  . Probiotic Product (PROBIOTIC PO) Take by mouth daily.    . propranolol (INDERAL) 80 MG tablet Take 80 mg by mouth 2 (two) times daily.    Marland Kitchen topiramate (TOPAMAX) 50 MG tablet One tab po qhs 30 tablet 11  . traMADol (ULTRAM) 50 MG tablet TAKE ONE TABLET BY MOUTH EVERY 6 HOURS AS NEEDED 30 tablet 0  . venlafaxine XR (EFFEXOR XR) 75 MG 24 hr capsule Take 1 capsule (75 mg total) by mouth daily with breakfast. 30 capsule 6  . ALPRAZolam (XANAX) 0.25 MG tablet Take one tab in am, one tab in afternoon, and two tabs at  bedtime.     No current facility-administered medications for this visit.     PAST MEDICAL HISTORY: Past Medical History:  Diagnosis Date  . Abdominal pain   . Anginal pain [facility])    sees [name]; reportedly negative stress/echo 2012  . Arthritis   . Bronchitis   . Cancer [facility])    breast cancer  . Carpal tunnel syndrome    bilaterally  . Cataracts, bilateral   . Complication of anesthesia    "difficutly waking up and hypothermia and stopped breathing"  . Confusion   . Dementia    early stages per patient  . Dementia    "early stages per patient" and long term memory loss  . Depression   . Diverticulosis   . Esophageal reflux   . Gallstones   . Hypertension    sees [name]  . IBS (irritable bowel syndrome)   . Irritable bowel   . Migraines   . Nausea   . Osteoporosis   . PONV (postoperative nausea and vomiting)   . Refusal of blood transfusions as patient is Jehovah's Witness     PAST SURGICAL HISTORY: Past Surgical History:  Procedure Laterality Date  . ANKLE SURGERY    . BREAST SURGERY     x 4  . CARDIOVASCULAR STRESS TEST    . CHOLECYSTECTOMY  10/04/2012   Procedure: LAPAROSCOPIC CHOLECYSTECTOMY WITH INTRAOPERATIVE CHOLANGIOGRAM;  Surgeon: Adin Hector, MD;  Location: [place];  Service: General;  Laterality: N/A;  laparosopic cholecystectomy with cholangiogram and repair of umbilical hernia  . COLONOSCOPY  08/30/11  . DILATION AND CURETTAGE OF UTERUS    . DOPPLER ECHOCARDIOGRAPHY     hx   . ERCP  10/05/2012   Procedure: ENDOSCOPIC RETROGRADE CHOLANGIOPANCREATOGRAPHY (ERCP);  Surgeon: Jeryl Columbia, MD;  Location: [facility] ENDOSCOPY;  Service: Endoscopy;  Laterality: N/A;  . ESOPHAGOGASTRODUODENOSCOPY  08/30/11  . JOINT REPLACEMENT     knee - right  . KELOID EXCISION     from bilateral earlobes  . KNEE SURGERY    . LAPAROSCOPIC CHOLECYSTECTOMY W/ CHOLANGIOGRAPHY  10/04/2012  . SHOULDER SURGERY  06/2011  . SKIN GRAFT    . TUBAL LIGATION    .  WRIST SURGERY  1995    FAMILY HISTORY: Family History  Problem Relation Age of Onset  . Colon polyps Mother     SOCIAL HISTORY:  Social History   Social History  . Marital status: Widowed    Spouse name: N/A  . Number of children: 2  . Years of education:  12   Occupational History  . Sales     Retired   Social History Main Topics  . Smoking status: Never Smoker  . Smokeless tobacco: Never Used  . Alcohol use No  . Drug use: No  . Sexual activity: Not on file   Other Topics Concern  . Not on file   Social History Narrative   Patient lives at home with her father and he is blind. Patient is retired. Patient is widowed and has two children.   Right handed.   Caffeine- None     PHYSICAL EXAM   Vitals:   09/27/16 1427  BP: (!) 162/76  Pulse: 64  Resp: 16  Weight: 190 lb 8 oz (86.4 kg)  Height: 5' 6"  (1.676 m)    Not recorded      Body mass index is 30.75 kg/m.  PHYSICAL EXAMNIATION:  Gen: NAD, conversant, well nourised, obese, well groomed                     Cardiovascular: Regular rate rhythm, no peripheral edema, warm, nontender. Eyes: Conjunctivae clear without exudates or hemorrhage Neck: Supple, no carotid bruise. Pulmonary: Clear to auscultation bilaterally  Skin: She has multiple keloid,  NEUROLOGICAL EXAM:  MENTAL STATUS: Speech:    Speech is normal; fluent and spontaneous with normal comprehension.  Cognition: MMSE 29 out of 30 animal naming 15     She is not oriented to season     Recent and remote memory: missed 2/3 recall.     Normal Attention span and concentration     Normal Language, naming, repeating,spontaneous speech     Fund of knowledge   CRANIAL NERVES: CN II: Visual fields are full to confrontation. Fundoscopic exam is normal with sharp discs and no vascular changes. Pupils are round equal and briskly reactive to light. She has bilateral conjunctiva erythematous changes CN III, IV, VI: extraocular movement are normal.  No ptosis. CN V: Facial sensation is intact to pinprick in all 3 divisions bilaterally. Corneal responses are intact.  CN VII: Face is symmetric with normal eye closure and smile. CN VIII: Hearing is normal to rubbing fingers CN IX, X: Palate elevates symmetrically. Phonation is normal. CN XI: Head turning and shoulder shrug are intact CN XII: Tongue is midline with normal movements and no atrophy.  MOTOR: There is no pronator drift of out-stretched arms. Muscle bulk and tone are normal. Muscle strength is normal.  REFLEXES: Reflexes are hypoactive and symmetric at the biceps, triceps, knees, and ankles. Plantar responses are flexor.  SENSORY: Length dependent decreased to light touch, pinprick, and vibratory sensations to distal shin   Coordination: are intact. There is no dysmetria on finger-to-nose and heel-knee-shin.    GAIT/STANCE: She needs the cane to get up from seated position, unsteady, scoliosis right knee valgrus   DIAGNOSTIC DATA (LABS, IMAGING, TESTING) - I reviewed patient records, labs, notes, testing and imaging myself where available.   ASSESSMENT AND PLAN  Chellsea Beckers is a 75 y.o. female    Increased frequency of headaches, recent diagnosis of bilateral glaucoma, elevated C-reactive protein  Her bilateral eye pain has much improved with prednisone treatment, no recurrent headaches after tapering off prednisone  She responded where well to prednisone dose, there was evidence of elevated C-reactive protein, ESR, likely a component of inflammatory changes,    she adamantly refused temporal artery biopsy, with history of severe keloid, with pain surrounding her keloid  Her headache has been much improved  with Topamax 50 mg once a day.  Repeat ESR, CRP  Mild cognitive impairment   Mini-Mental Status Examination is 29 out of 71 today  Strong family history of Alzheimer's dementia  MRI of the brain showed no significant abnormality  Laboratory evaluation showed  no treatable causes  Continue Namenda 10 mg twice a day,add on Aricept 40m daily.  History of breast cancer, with chemotherapy  Diffuse body achy pain  Will try Cymbalta 60 mg daily  Gait difficulty  Due to bilateral feet pain, low back pain, scoliosis, right knee pain deformity    YMarcial Pacas M.D. Ph.D.  [facility] [address] [place] [id number]: [phone]: [phone] CC: ELevin Erp MD

## 2016-12-31 ENCOUNTER — Other Ambulatory Visit: Payer: Self-pay | Admitting: Neurology

## 2017-01-02 ENCOUNTER — Other Ambulatory Visit: Payer: Self-pay | Admitting: Internal Medicine

## 2017-01-02 DIAGNOSIS — Z1231 Encounter for screening mammogram for malignant neoplasm of breast: Secondary | ICD-10-CM

## 2017-01-17 ENCOUNTER — Inpatient Hospital Stay: Admission: RE | Admit: 2017-01-17 | Payer: Medicare Other | Source: Ambulatory Visit

## 2017-01-31 ENCOUNTER — Ambulatory Visit (HOSPITAL_COMMUNITY)
Admission: EM | Admit: 2017-01-31 | Discharge: 2017-01-31 | Disposition: A | Discharge status: Home / Self Care | Payer: Medicare Other | Attending: Internal Medicine | Admitting: Internal Medicine

## 2017-01-31 ENCOUNTER — Ambulatory Visit (INDEPENDENT_AMBULATORY_CARE_PROVIDER_SITE_OTHER): Payer: Medicare Other

## 2017-01-31 ENCOUNTER — Encounter (HOSPITAL_COMMUNITY): Payer: Self-pay | Admitting: Family Medicine

## 2017-01-31 DIAGNOSIS — M25532 Pain in left wrist: Secondary | ICD-10-CM

## 2017-01-31 DIAGNOSIS — W19XXXA Unspecified fall, initial encounter: Secondary | ICD-10-CM

## 2017-01-31 DIAGNOSIS — M79642 Pain in left hand: Secondary | ICD-10-CM

## 2017-01-31 NOTE — ED Provider Notes (Signed)
CSN: [id number]     Arrival date & time 01/31/17  1003 History   None    Chief Complaint  Patient presents with  . Fall   (Consider location/radiation/quality/duration/timing/severity/associated sxs/prior Treatment) 76 year old female presents to clinic for evaluation secondary to a fall at home. She states she was walking to the door, slipped and fell on some carpet landing on her left side. She denies hitting her head, had no loss of consciousness, no headache, no nausea, no blurred vision, no weakness or numbness in left side. She does not take any blood thinners. She has pain in her left shoulder, left elbow, left hand, left hip, left knee, left foot.   The history is provided by the patient.    Past Medical History:  Diagnosis Date  . Abdominal pain   . Anginal pain [facility])    sees [name]; reportedly negative stress/echo 2012  . Arthritis   . Bronchitis   . Cancer [facility])    breast cancer  . Carpal tunnel syndrome    bilaterally  . Cataracts, bilateral   . Complication of anesthesia    "difficutly waking up and hypothermia and stopped breathing"  . Confusion   . Dementia    early stages per patient  . Dementia    "early stages per patient" and long term memory loss  . Depression   . Diverticulosis   . Esophageal reflux   . Gallstones   . Hypertension    sees [name]  . IBS (irritable bowel syndrome)   . Irritable bowel   . Migraines   . Nausea   . Osteoporosis   . PONV (postoperative nausea and vomiting)   . Refusal of blood transfusions as patient is Jehovah's Witness    Past Surgical History:  Procedure Laterality Date  . ANKLE SURGERY    . BREAST SURGERY     x 4  . CARDIOVASCULAR STRESS TEST    . CHOLECYSTECTOMY  10/04/2012   Procedure: LAPAROSCOPIC CHOLECYSTECTOMY WITH INTRAOPERATIVE CHOLANGIOGRAM;  Surgeon: Adin Hector, MD;  Location: [place];  Service: General;  Laterality: N/A;  laparosopic cholecystectomy with cholangiogram and repair  of umbilical hernia  . COLONOSCOPY  08/30/11  . DILATION AND CURETTAGE OF UTERUS    . DOPPLER ECHOCARDIOGRAPHY     hx   . ERCP  10/05/2012   Procedure: ENDOSCOPIC RETROGRADE CHOLANGIOPANCREATOGRAPHY (ERCP);  Surgeon: Jeryl Columbia, MD;  [facility] ENDOSCOPY;  Service: Endoscopy;  Laterality: N/A;  . ESOPHAGOGASTRODUODENOSCOPY  08/30/11  . JOINT REPLACEMENT     knee - right  . KELOID EXCISION     from bilateral earlobes  . KNEE SURGERY    . LAPAROSCOPIC CHOLECYSTECTOMY W/ CHOLANGIOGRAPHY  10/04/2012  . SHOULDER SURGERY  06/2011  . SKIN GRAFT    . TUBAL LIGATION    . WRIST SURGERY  1995   Family History  Problem Relation Age of Onset  . Colon polyps Mother    Social History  Substance Use Topics  . Smoking status: Never Smoker  . Smokeless tobacco: Never Used  . Alcohol use No   OB History    No data available     Review of Systems  Reason unable to perform ROS: as covered in HPI.  All other systems reviewed and are negative.   Allergies  Plavix [clopidogrel bisulfate]; Warfarin and related; and Adhesive [tape]  Home Medications   Prior to Admission medications   Medication Sig Start Date End Date Taking? Authorizing Provider  Acetaminophen (  TYLENOL PO) Take by mouth as needed.    Historical Provider, MD  ALPRAZolam Duanne Moron) 0.25 MG tablet Take one tab in am, one tab in afternoon, and two tabs at bedtime. 09/08/15   Historical Provider, MD  Aspirin 81 MG EC tablet Take 81 mg by mouth daily.    Historical Provider, MD  Calcium Carbonate-Vit D-Min (CALCIUM 1200 PO) Take 1 tablet by mouth 2 (two) times daily.    Historical Provider, MD  Cholecalciferol (VITAMIN D-3 PO) Take 1 tablet by mouth daily.    Historical Provider, MD  diltiazem (CARDIZEM CD) 120 MG 24 hr capsule  05/13/14   Historical Provider, MD  diphenoxylate-atropine (LOMOTIL) 2.5-0.025 MG tablet Take 2 tablets by mouth 4 (four) times daily as needed for diarrhea or loose stools. 03/15/16   Varney Biles, MD   donepezil (ARICEPT) 10 MG tablet Take 1 tablet (10 mg total) by mouth at bedtime. 09/27/16   Marcial Pacas, MD  DULoxetine (CYMBALTA) 60 MG capsule Take 1 capsule (60 mg total) by mouth daily. 03/27/16   Marcial Pacas, MD  fluorometholone (FML) 0.1 % ophthalmic suspension  11/01/15   Historical Provider, MD  furosemide (LASIX) 40 MG tablet  09/18/15   Historical Provider, MD  ketorolac (ACULAR) 0.4 % SOLN  10/30/15   Historical Provider, MD  loperamide (IMODIUM A-D) 2 MG tablet Take 2 mg by mouth 4 (four) times daily as needed. For diarrhea    Historical Provider, MD  losartan (COZAAR) 50 MG tablet Take 50 mg by mouth daily.    Historical Provider, MD  memantine (NAMENDA) 10 MG tablet Take 1 tablet (10 mg total) by mouth 2 (two) times daily. 09/27/16   Marcial Pacas, MD  Multiple Vitamins-Minerals (PRESERVISION AREDS PO) Take by mouth daily.    Historical Provider, MD  ofloxacin (OCUFLOX) 0.3 % ophthalmic solution  10/30/15   Historical Provider, MD  potassium chloride (K-DUR,KLOR-CON) 10 MEQ tablet Take 10 mEq by mouth daily.    Historical Provider, MD  Probiotic Product (PROBIOTIC PO) Take by mouth daily.    Historical Provider, MD  propranolol (INDERAL) 80 MG tablet Take 80 mg by mouth 2 (two) times daily.    Historical Provider, MD  topiramate (TOPAMAX) 50 MG tablet TAKE ONE TABLET BY MOUTH ONCE DAILY AT BEDTIME 01/01/17   Marcial Pacas, MD   Meds Ordered and Administered this Visit  Medications - No data to display  BP 174/79   Pulse 67   Temp 98.3 F (36.8 C)   Resp 18   SpO2 100%  No data found.   Physical Exam  Constitutional: She is oriented to person, place, and time. She appears well-developed and well-nourished. No distress.  HENT:  Head: Normocephalic and atraumatic.  Cardiovascular: Normal rate and regular rhythm.   Pulmonary/Chest: Effort normal and breath sounds normal.  Musculoskeletal:  Further range of motion in the neck without pain, full range of motion around the left shoulder  with abduction abduction election internal/external rotation without pain, full range of motion around the left elbow, pain and tenderness to the left hand, particularly tender with palpation at the distal radial head, at the anatomical snuffbox, full range of motion around the knee, no tenderness or laxity with medial or lateral flexion, extension or flexion.  Neurological: She is alert and oriented to person, place, and time.  Skin: Skin is warm and dry. Capillary refill takes less than 2 seconds. She is not diaphoretic.  Psychiatric: She has a normal mood and affect. Her behavior  is normal.  Nursing note and vitals reviewed.   Urgent Care Course     Procedures (including critical care time)  Labs Review Labs Reviewed - No data to display  Imaging Review Dg Hand Complete Left  Result Date: 01/31/2017 CLINICAL DATA:  Left hand pain secondary to a fall this morning. EXAM: LEFT HAND - COMPLETE 3+ VIEW COMPARISON:  None. FINDINGS: There is slight osteoarthritis of the IP joints of the fingers and thumb. Slight arthritis at the first carpometacarpal joint and to a lesser degree of the metacarpophalangeal joints. No fracture or dislocation or other acute bone abnormality. IMPRESSION: No acute abnormality.  Arthritic changes as described. Electronically Signed   By: Lorriane Shire M.D.   On: 01/31/2017 11:19       MDM   1. Fall, initial encounter   2. Left wrist pain    Left hand x-ray to rule out possible fracture, wrist placed in splint, patient may OTC Tylenol for pain control. Follow-up with primary care or orthopedics if pain persists.       Barnet Glasgow, NP 01/31/17 [place], NP 01/31/17 1153

## 2017-01-31 NOTE — ED Triage Notes (Signed)
Pt here for fall this am. sts that she was carrying stuff in her arm and went to the door because she heard something. sts then she turned around and fell. sts lost her balance. Denies LOC or hitting head. sts she falls a lot. sts that her whole left side is hurting.

## 2017-01-31 NOTE — Discharge Instructions (Signed)
There is no evidence of fracture on your x-ray. We have placed your wrist in a splint. I recommend Tylenol over-the-counter as needed for pain. Should your pain persist, follow up with your primary care provider in one week, or return to clinic as needed.

## 2017-02-05 ENCOUNTER — Ambulatory Visit
Admission: RE | Admit: 2017-02-05 | Discharge: 2017-02-05 | Disposition: A | Discharge status: Home / Self Care | Payer: Medicare Other | Source: Ambulatory Visit | Attending: Internal Medicine | Admitting: Internal Medicine

## 2017-02-05 DIAGNOSIS — Z1231 Encounter for screening mammogram for malignant neoplasm of breast: Secondary | ICD-10-CM

## 2017-03-27 ENCOUNTER — Ambulatory Visit: Payer: Medicare Other | Admitting: Nurse Practitioner

## 2017-04-04 ENCOUNTER — Ambulatory Visit (INDEPENDENT_AMBULATORY_CARE_PROVIDER_SITE_OTHER): Payer: Medicare Other | Admitting: Nurse Practitioner

## 2017-04-04 ENCOUNTER — Encounter: Payer: Self-pay | Admitting: Nurse Practitioner

## 2017-04-04 VITALS — BP 143/75 | HR 64 | Ht 66.0 in | Wt 197.0 lb

## 2017-04-04 DIAGNOSIS — G3184 Mild cognitive impairment, so stated: Secondary | ICD-10-CM

## 2017-04-04 DIAGNOSIS — G44219 Episodic tension-type headache, not intractable: Secondary | ICD-10-CM | POA: Diagnosis not present

## 2017-04-04 DIAGNOSIS — R269 Unspecified abnormalities of gait and mobility: Secondary | ICD-10-CM | POA: Diagnosis not present

## 2017-04-04 NOTE — Progress Notes (Signed)
[facility]  PATIENT: Ashley Hanson DOB: Jul 29, 1941   REASON FOR VISIT: Follow-up for chronic migraine, gait abnormality mild cognitive impairment HISTORY FROM: patient alone at visit    Ashley Hanson is a 76 years old right-handed female, last clinical visit was with Dr. Jim Like August 2015 previously a patient of Dr. Erling Cruz  She had a history of chronic ocular migraine the past, presented with colorful visual distortion, oftentimes followed by retro-orbital area headaches, when she was younger she has headaches frequently, sometimes the point of passing out, her headache has much improved, especially while take gabapentin 600 mg twice a day, since September 2016, she began to have frequent almost daily pressure behind both eyes, also complains of decreased vision, she has tried over-the-counter ibuprofen Tylenol did not work for her,  She had a history of macular degeneration with decreased vision to begin with, also  had bilateral cataract surgery, recent diagnosis of glaucoma by ophthalmologist Dr. Carolynn Sayers She complains of many years history of gradual onset memory trouble, she retired as a Therapist, occupational, she still able to drive, lives in apartment by herself, manage her whole own finances, Gait difficulty due to bilateral feet pain, scoliosis, chronic low back pain.  UPDATE Nov 01 2015:YYI reviewed a note from her ophthalmologist Dr. Katy Fitch dated October 28 2015, patient complains of visual distortions of shapes and sizes, movement of visual imagings, Funduscopy examination showed normal appearance of optic nerve with healthy pink cream and normal appearance nerve fiber layers, macular showed dry age-related macular degeneration, vessels are normal,  vitreous was normal, The conclusion was suspected glaucoma, OD was 12, OS was 14, reported that her father had a history of glaucoma She was put on 3 eye drops, she has not started yet, she  also had bilateral conjunctiva erythematous change, was diagnosed with recurrent iridocystitis Previous laboratory evaluation showed elevated C reactive protein of 21, with normal ESR, She wear sunglasses, she complains of light sensitive, she continues both retro-orbital area pressure pain, constant 9/10, x24 hours. Topamax 172m bid did not help her headaches  UPDATE Nov 18 2015:YY Her headache and conjunctiva erythematous has much improved, everytime she took Fioricet, she had GI side effect, feel sick, band like sensation. She was started on prednisone since December twelfth 2016, started from 20 mg daily, now 10 milligrams daily, she can tolerate the medication well, which has helped her headaches, she is also on higher dose of Topamax 100 mg twice a day, Gabapentin up to 600 mg twice a day did not help her headaches, or her bilateral lower extremity paresthesia pain, will change to Lyrica today, Previous laboratory showed elevated C reactive protein up to 20.9, her headache has much improved with prednisone, likely a component of inflammatory process  UPDATE Dec 28 2015:YY Her headache, and eye pain has much improved, with no recurrent headache after tapering off prednisone, she is now taking Topamax 100 mg twice a day, she is complaining of increased memory trouble, difficulty focusing, She lives by herself since [id number] her husband passed away, she graduated from high school, used to work as a sMuseum/gallery curator retired around age 76 she has strong family history of Alzheimer's disease, her maternal grandmother, mother, maternal aunt, sister suffered Alzheimer's disease  UPDATE May 8th 2017:YYI reviewed laboratory evaluation, she has elevated ESR of 50, previous elevated C reactive protein up to 21 in Nov 2016, frequent headaches, responded very well to prednison, she is off steroid now, has  no recurrent headache,   I have talked about potential temporal artery biopsy to rule out  temporal arteritis, she adamantly refused, she suffered severe keloid, with associated neuropathic pain, She is taking Topamax 50 mg twice a day, overall tolerating it well, she no longer has frequent migraine headaches, She complains of diffuse body achy pain multiple joints pain, neuropathic pain around her keloid, worsening scoliosis, and worsening gait difficulty We have personally reviewed MRI of the brain without contrast in April 2017, there was no significant abnormality UPDATE Nov 8th 2017:Ashley Hanson is accompanied by her daughter at today's clinical visit, she reported recent onset of left side headache, left eye achy pain, overall has improved, today's Mini-Mental status examination 29/30  We have personally reviewed MRI brain in February 2017, generalized atrophy mostly at the left parasylvian fissure, slight supratentorium small vessel disease. She is able to tolerate Namenda 10 mg twice a day, Cymbalta 60 mg well, she lives by herself, just sold her car, is in the process of get scat service, continue have diffuse body joints pain, gait abnormality  UPDATE  05/16/2018CM  [name] , 76 year old female returns for follow-up . She has a history of mild cognitive impairment and her MMSE today is 28 out of 30. Last was 29 out of 30. She is currently on Namenda and Aricept without side effects. She occasionally has  word finding problems.  She denies any recent falls she does ambulate with a cane.  She has diffuse body joint pain. Previous history of right knee replacement needs left knee replacement.  She was placed on Cymbalta at her last visit with Dr.Yan however she is not taking the medication.She claims that her headaches have stopped no headaches in over 3 months , she has stopped her Topamax.  She returns for reevaluation  REVIEW OF SYSTEMS: Full 14 system review of systems performed and notable only for those listed, all others are neg:  Constitutional: neg  Cardiovascular:  neg Ear/Nose/Throat: neg  Skin: neg Eyes: neg Respiratory: neg Gastroitestinal: neg  Hematology/Lymphatic: neg  Endocrine: neg Musculoskeletal:joint pain Allergy/Immunology: neg Neurological:  Occasional word finding difficulty , tremors Psychiatric:  depression Sleep : neg   ALLERGIES: Allergies  Allergen Reactions  . Plavix [Clopidogrel Bisulfate] Itching and Other (See Comments)    Causes migraines. Itching all over the body.  . Warfarin And Related Other (See Comments)    Blood thinners - cause migraines in patient.  . Adhesive [Tape] Other (See Comments)    Severe irritation of skin    HOME MEDICATIONS: Outpatient Medications Prior to Visit  Medication Sig Dispense Refill  . Acetaminophen (TYLENOL PO) Take by mouth as needed.    . ALPRAZolam (XANAX) 0.25 MG tablet Take one tab in am, one tab in afternoon, and two tabs at bedtime.    . Aspirin 81 MG EC tablet Take 81 mg by mouth daily.    . Calcium Carbonate-Vit D-Min (CALCIUM 1200 PO) Take 1 tablet by mouth 2 (two) times daily.    . Cholecalciferol (VITAMIN D-3 PO) Take 1 tablet by mouth daily.    Marland Kitchen diltiazem (CARDIZEM CD) 120 MG 24 hr capsule     . diphenoxylate-atropine (LOMOTIL) 2.5-0.025 MG tablet Take 2 tablets by mouth 4 (four) times daily as needed for diarrhea or loose stools. 40 tablet 0  . donepezil (ARICEPT) 10 MG tablet Take 1 tablet (10 mg total) by mouth at bedtime. 30 tablet 11  . DULoxetine (CYMBALTA) 60 MG capsule Take 1 capsule (60 mg  total) by mouth daily. 30 capsule 12  . fluorometholone (FML) 0.1 % ophthalmic suspension     . furosemide (LASIX) 40 MG tablet     . ketorolac (ACULAR) 0.4 % SOLN     . loperamide (IMODIUM A-D) 2 MG tablet Take 2 mg by mouth 4 (four) times daily as needed. For diarrhea    . losartan (COZAAR) 50 MG tablet Take 50 mg by mouth daily.    . memantine (NAMENDA) 10 MG tablet Take 1 tablet (10 mg total) by mouth 2 (two) times daily. 180 tablet 4  . Multiple Vitamins-Minerals  (PRESERVISION AREDS PO) Take by mouth daily.    Marland Kitchen ofloxacin (OCUFLOX) 0.3 % ophthalmic solution     . potassium chloride (K-DUR,KLOR-CON) 10 MEQ tablet Take 10 mEq by mouth daily.    . Probiotic Product (PROBIOTIC PO) Take by mouth daily.    . propranolol (INDERAL) 80 MG tablet Take 80 mg by mouth 2 (two) times daily.    Marland Kitchen topiramate (TOPAMAX) 50 MG tablet TAKE ONE TABLET BY MOUTH ONCE DAILY AT BEDTIME (Patient not taking: Reported on 04/04/2017) 90 tablet 3   No facility-administered medications prior to visit.     PAST MEDICAL HISTORY: Past Medical History:  Diagnosis Date  . Abdominal pain   . Anginal pain [facility])    sees [name]; reportedly negative stress/echo 2012  . Arthritis   . Bronchitis   . Cancer [facility])    breast cancer  . Carpal tunnel syndrome    bilaterally  . Cataracts, bilateral   . Complication of anesthesia    "difficutly waking up and hypothermia and stopped breathing"  . Confusion   . Dementia    early stages per patient  . Dementia    "early stages per patient" and long term memory loss  . Depression   . Diverticulosis   . Esophageal reflux   . Gallstones   . Hypertension    sees [name]  . IBS (irritable bowel syndrome)   . Irritable bowel   . Migraines   . Nausea   . Osteoporosis   . PONV (postoperative nausea and vomiting)   . Refusal of blood transfusions as patient is Jehovah's Witness     PAST SURGICAL HISTORY: Past Surgical History:  Procedure Laterality Date  . ANKLE SURGERY    . BREAST SURGERY     x 4  . CARDIOVASCULAR STRESS TEST    . CHOLECYSTECTOMY  10/04/2012   Procedure: LAPAROSCOPIC CHOLECYSTECTOMY WITH INTRAOPERATIVE CHOLANGIOGRAM;  Surgeon: Adin Hector, MD;  Location: [facility];  Service: General;  Laterality: N/A;  laparosopic cholecystectomy with cholangiogram and repair of umbilical hernia  . COLONOSCOPY  08/30/11  . DILATION AND CURETTAGE OF UTERUS    . DOPPLER ECHOCARDIOGRAPHY     hx   . ERCP  10/05/2012    Procedure: ENDOSCOPIC RETROGRADE CHOLANGIOPANCREATOGRAPHY (ERCP);  Surgeon: Jeryl Columbia, MD;  Location: [facility] ENDOSCOPY;  Service: Endoscopy;  Laterality: N/A;  . ESOPHAGOGASTRODUODENOSCOPY  08/30/11  . JOINT REPLACEMENT     knee - right  . KELOID EXCISION     from bilateral earlobes  . KNEE SURGERY    . LAPAROSCOPIC CHOLECYSTECTOMY W/ CHOLANGIOGRAPHY  10/04/2012  . SHOULDER SURGERY  06/2011  . SKIN GRAFT    . TUBAL LIGATION    . WRIST SURGERY  1995    FAMILY HISTORY: Family History  Problem Relation Age of Onset  . Colon polyps Mother   . Breast cancer Sister  SOCIAL HISTORY: Social History   Social History  . Marital status: Widowed    Spouse name: N/A  . Number of children: 2  . Years of education: 12   Occupational History  . Sales     Retired   Social History Main Topics  . Smoking status: Never Smoker  . Smokeless tobacco: Never Used  . Alcohol use No  . Drug use: No  . Sexual activity: Not on file   Other Topics Concern  . Not on file   Social History Narrative   Patient lives at home with her father and he is blind. Patient is retired. Patient is widowed and has two children.   Right handed.   Caffeine- None     PHYSICAL EXAM  Vitals:   04/04/17 1250  BP: (!) 143/75  Pulse: 64  Weight: 197 lb (89.4 kg)  Height: 5' 6"  (1.676 m)   Body mass index is 31.8 kg/m.  Generalized: Well developed,  Obese female in no acute distress  Head: normocephalic and atraumatic,. Oropharynx benign  Neck: Supple, no carotid bruits  Cardiac: Regular rate rhythm, no murmur  Musculoskeletal: No deformity  Skin  Multiple keloids  Neurological examination   Mentation: Alert , AFT 10. Clock drawing 4/4 MMSE - Mini Mental State Exam 04/04/2017 03/27/2016 12/28/2015  Orientation to time 4 4 4   Orientation to Place 5 5 5   Registration 3 3 3   Attention/ Calculation 5 5 1   Recall 2 2 2   Language- name 2 objects 2 2 2   Language- repeat 1 1 1   Language- follow 3  step command 3 3 3   Language- read & follow direction 1 1 1   Write a sentence 1 1 1   Copy design 1 1 0  Total score 28 28 23   ate. Recent and remote memory intact.  Follows all commands speech and language fluent.   Cranial nerve II-XII: .Pupils were equal round reactive to light extraocular movements were full, visual field were full on confrontational test. She has bilateral  conjunctiva erythematous.  Facial sensation and strength were normal. hearing was intact to finger rubbing bilaterally. Uvula tongue midline. head turning and shoulder shrug were normal and symmetric.Tongue protrusion into cheek strength was normal. Motor: normal bulk and tone, full strength in the BUE, BLE, fine finger movements normal, no pronator drift. No focal weakness Sensory:  Length dependent decreased light touch, pinprick, and  Vibration,  To shins Coordination: finger-nose-finger, heel-to-shin bilaterally, no dysmetria Reflexes:  Symmetric upper and lower, plantar responses were flexor bilaterally. Gait and Station: Rising up from seated position without assistance,  Ambulates with single-point cane , unsteady gait, scoliois right knee valgrus   DIAGNOSTIC DATA (LABS, IMAGING, TESTING) - I reviewed patient records, labs, notes, testing and imaging myself where available.  Lab Results  Component Value Date   WBC 7.2 03/14/2016   HGB 13.3 03/14/2016   HCT 40.9 03/14/2016   MCV 93.2 03/14/2016   PLT 187 03/14/2016      Component Value Date/Time   NA 138 03/14/2016 2002   NA 139 01/11/2016 1417   K 3.1 (L) 03/14/2016 2002   CL 107 03/14/2016 2002   CO2 21 (L) 03/14/2016 2002   GLUCOSE 102 (H) 03/14/2016 2002   BUN 6 03/14/2016 2002   BUN 10 01/11/2016 1417   CREATININE 0.95 03/14/2016 2002   CALCIUM 9.3 03/14/2016 2002   PROT 7.5 03/14/2016 2002   PROT 7.1 01/11/2016 1417   ALBUMIN 3.5 03/14/2016 2002  ALBUMIN 3.9 01/11/2016 1417   AST 24 03/14/2016 2002   ALT 21 03/14/2016 2002   ALKPHOS  131 (H) 03/14/2016 2002   BILITOT 0.5 03/14/2016 2002   BILITOT 0.3 01/11/2016 1417   GFRNONAA 58 (L) 03/14/2016 2002   GFRAA >60 03/14/2016 2002     ASSESSMENT AND PLAN  76 y.o. year old female  has a past medical history of  Refusal of blood transfusions as patient is Jehovah's Witness.  With diagnosis of  Bilateral glaucoma,  C-reactive protein elevated. Improved with prednisome. Pt refused temporal bx. Headaches are improved.  Mild cognitive impairment, diffuse body aches and multifactorial gait disorder here to follow-up                 PLAN: Headaches improved none in 3 months not taking Topamax Memory score is stable continue Aricept and Namenda Gait disorder use cane at all times to prevent falls her gait disorder is multifactorial due to bilateral knee pain and back pain scoliosis left and right knee pain. She was on Cymbalta but has stopped that medication Follow up 8 months I spent 25 minutes in total face to face time with the patient more than 50% of which was spent counseling and coordination of care, reviewing test results reviewing medications and discussing and reviewing the diagnosis of headaches, mild cognitive impairment and gait disorder.  Follow a structured environment try not to multitask use a daily [facility] [address], [place],  [address] [phone]

## 2017-04-04 NOTE — Patient Instructions (Signed)
Headaches improved not taking Topamax Memory score is stable continue Aricept and Namenda Gait disorder use cane at all times to prevent falls  Follow up 8 months

## 2017-04-04 NOTE — Progress Notes (Signed)
I have reviewed and agreed above plan. 

## 2017-04-08 ENCOUNTER — Telehealth: Payer: Self-pay | Admitting: Neurology

## 2017-04-10 ENCOUNTER — Other Ambulatory Visit: Payer: Self-pay | Admitting: Neurology

## 2017-04-17 NOTE — Telephone Encounter (Signed)
Called [facility] - they have refills on file through 08/2017.  Pt is aware and will contact pharmacy when she is ready for her refill.

## 2017-04-17 NOTE — Telephone Encounter (Signed)
Pt calling because she was advised by Ashley Hanson to call, it has been a week and she has yet to be able to get the medication.  [facility] is telling her that an order needs to come from Dr Krista Blue for a refill, please call

## 2017-05-24 ENCOUNTER — Other Ambulatory Visit: Payer: Self-pay | Admitting: *Deleted

## 2017-05-24 MED ORDER — MEMANTINE HCL 10 MG PO TABS: 10.0000 mg | ORAL_TABLET | Freq: Two times a day (BID) | ORAL | 3 refills | Status: DC

## 2017-05-24 MED ORDER — DONEPEZIL HCL 10 MG PO TABS: 10.0000 mg | ORAL_TABLET | Freq: Every day | ORAL | 3 refills | Status: DC

## 2017-08-14 ENCOUNTER — Other Ambulatory Visit (HOSPITAL_COMMUNITY): Payer: Self-pay | Admitting: Internal Medicine

## 2017-08-14 DIAGNOSIS — I50810 Right heart failure, unspecified: Secondary | ICD-10-CM

## 2017-08-17 ENCOUNTER — Ambulatory Visit (HOSPITAL_COMMUNITY)
Admission: RE | Admit: 2017-08-17 | Discharge: 2017-08-17 | Disposition: A | Discharge status: Home / Self Care | Payer: Medicare Other | Source: Ambulatory Visit | Attending: Internal Medicine | Admitting: Internal Medicine

## 2017-08-17 DIAGNOSIS — I110 Hypertensive heart disease with heart failure: Secondary | ICD-10-CM | POA: Diagnosis not present

## 2017-08-17 DIAGNOSIS — I2510 Atherosclerotic heart disease of native coronary artery without angina pectoris: Secondary | ICD-10-CM | POA: Diagnosis not present

## 2017-08-17 DIAGNOSIS — I50810 Right heart failure, unspecified: Secondary | ICD-10-CM | POA: Diagnosis not present

## 2017-08-17 DIAGNOSIS — I081 Rheumatic disorders of both mitral and tricuspid valves: Secondary | ICD-10-CM | POA: Diagnosis not present

## 2017-08-17 NOTE — Progress Notes (Signed)
  Echocardiogram 2D Echocardiogram has been performed.  Ashley Hanson 08/17/2017, 1:22 PM

## 2017-12-04 NOTE — Progress Notes (Signed)
[facility]  PATIENT: Ashley Hanson DOB: 10-29-1941   REASON FOR VISIT: Follow-up for  migraine, gait abnormality mild cognitive impairment HISTORY FROM: patient alone at visit    [place] ILLNESS:Ashley Hanson is a 77 years old right-handed female, last clinical visit was with Dr. Jim Like August 2015 previously a patient of Dr. Erling Cruz  She had a history of chronic ocular migraine the past, presented with colorful visual distortion, oftentimes followed by retro-orbital area headaches, when she was younger she has headaches frequently, sometimes the point of passing out, her headache has much improved, especially while take gabapentin 600 mg twice a day, since September 2016, she began to have frequent almost daily pressure behind both eyes, also complains of decreased vision, she has tried over-the-counter ibuprofen Tylenol did not work for her,  She had a history of macular degeneration with decreased vision to begin with, also  had bilateral cataract surgery, recent diagnosis of glaucoma by ophthalmologist Dr. Carolynn Sayers She complains of many years history of gradual onset memory trouble, she retired as a Therapist, occupational, she still able to drive, lives in apartment by herself, manage her whole own finances, Gait difficulty due to bilateral feet pain, scoliosis, chronic low back pain.  UPDATE Nov 01 2015:YYI reviewed a note from her ophthalmologist Dr. Katy Fitch dated October 28 2015, patient complains of visual distortions of shapes and sizes, movement of visual imagings, Funduscopy examination showed normal appearance of optic nerve with healthy pink cream and normal appearance nerve fiber layers, macular showed dry age-related macular degeneration, vessels are normal,  vitreous was normal, The conclusion was suspected glaucoma, OD was 12, OS was 14, reported that her father had a history of glaucoma She was put on 3 eye drops, she has not started yet, she also had  bilateral conjunctiva erythematous change, was diagnosed with recurrent iridocystitis Previous laboratory evaluation showed elevated C reactive protein of 21, with normal ESR, She wear sunglasses, she complains of light sensitive, she continues both retro-orbital area pressure pain, constant 9/10, x24 hours. Topamax 159m bid did not help her headaches  UPDATE Nov 18 2015:YY Her headache and conjunctiva erythematous has much improved, everytime she took Fioricet, she had GI side effect, feel sick, band like sensation. She was started on prednisone since December twelfth 2016, started from 20 mg daily, now 10 milligrams daily, she can tolerate the medication well, which has helped her headaches, she is also on higher dose of Topamax 100 mg twice a day, Gabapentin up to 600 mg twice a day did not help her headaches, or her bilateral lower extremity paresthesia pain, will change to Lyrica today, Previous laboratory showed elevated C reactive protein up to 20.9, her headache has much improved with prednisone, likely a component of inflammatory process  UPDATE Dec 28 2015:YY Her headache, and eye pain has much improved, with no recurrent headache after tapering off prednisone, she is now taking Topamax 100 mg twice a day, she is complaining of increased memory trouble, difficulty focusing, She lives by herself since MApril 03, 2011when her husband passed away, she graduated from high school, used to work as a sMuseum/gallery curator retired around age 77 she has strong family history of Alzheimer's disease, her maternal grandmother, mother, maternal aunt, sister suffered Alzheimer's disease  UPDATE May 8th 2017:YYI reviewed laboratory evaluation, she has elevated ESR of 50, previous elevated C reactive protein up to 21 in Nov 2016, frequent headaches, responded very well to prednison, she is off steroid now, has  no recurrent headache,   I have talked about potential temporal artery biopsy to rule out temporal  arteritis, she adamantly refused, she suffered severe keloid, with associated neuropathic pain, She is taking Topamax 50 mg twice a day, overall tolerating it well, she no longer has frequent migraine headaches, She complains of diffuse body achy pain multiple joints pain, neuropathic pain around her keloid, worsening scoliosis, and worsening gait difficulty We have personally reviewed MRI of the brain without contrast in April 2017, there was no significant abnormality UPDATE Nov 8th 2017:Ashley Hanson is accompanied by her daughter at today's clinical visit, she reported recent onset of left side headache, left eye achy pain, overall has improved, today's Mini-Mental status examination 29/30  We have personally reviewed MRI brain in February 2017, generalized atrophy mostly at the left parasylvian fissure, slight supratentorium small vessel disease. She is able to tolerate Namenda 10 mg twice a day, Cymbalta 60 mg well, she lives by herself, just sold her car, is in the process of get scat service, continue have diffuse body joints pain, gait abnormality  UPDATE  05/16/2018CM  Ashley Hanson , 77 year old female returns for follow-up She has a history of mild cognitive impairment and her MMSE today is 28 out of 30. Last was 29 out of 30. She is currently on Namenda and Aricept without side effects. She occasionally has  word finding problems.  She denies any recent falls she does ambulate with a cane.  She has diffuse body joint pain. Previous history of right knee replacement needs left knee replacement.  She was placed on Cymbalta at her last visit with Dr.Yan however she is not taking the medication.She claims that her headaches have stopped no headaches in over 3 months , she has stopped her Topamax.  She returns for reevaluation  UPDATE 12/05/2017 CM Ashley Hanson, 77 year old female returns for follow-up history of mild cognitive impairment.  She occasionally has some word finding problems.  Her MMSE 26 today  which is stable.  She is currently on Namenda twice daily and Aricept taking it at night.  She complains with occasional hallucinations has not slept well.  She was asked to take her Aricept in the morning.  She has not had any recent falls uses a single-point cane to ambulate she is no longer having headaches and stopped her Topamax he also has stopped her Cymbalta that she was taking for knee pain.  She says she is due to get a knee replacement in the summer.  She returns for reevaluation   REVIEW OF SYSTEMS: Full 14 system review of systems performed and notable only for those listed, all others are neg:  Constitutional: neg  Cardiovascular: neg Ear/Nose/Throat: neg  Skin: neg Eyes: Blurred vision Respiratory: neg Gastroitestinal: neg  Hematology/Lymphatic: neg  Endocrine: neg Musculoskeletal:joint pain knee Allergy/Immunology: neg Neurological:  Occasional word finding difficulty , mild cognitive impairment Psychiatric:  depression Sleep : neg   ALLERGIES: Allergies  Allergen Reactions  . Plavix [Clopidogrel Bisulfate] Itching and Other (See Comments)    Causes migraines. Itching all over the body.  . Warfarin And Related Other (See Comments)    Blood thinners - cause migraines in patient.  . Adhesive [Tape] Other (See Comments)    Severe irritation of skin    HOME MEDICATIONS: Outpatient Medications Prior to Visit  Medication Sig Dispense Refill  . Acetaminophen (TYLENOL PO) Take by mouth as needed.    . ALPRAZolam (XANAX) 0.25 MG tablet Take one tab in am, one tab in  afternoon, and two tabs at bedtime.    . Aspirin 81 MG EC tablet Take 81 mg by mouth daily.    . Calcium Carbonate-Vit D-Min (CALCIUM 1200 PO) Take 1 tablet by mouth 2 (two) times daily.    . Cholecalciferol (VITAMIN D-3 PO) Take 1 tablet by mouth daily.    Marland Kitchen diltiazem (CARDIZEM CD) 120 MG 24 hr capsule     . diphenoxylate-atropine (LOMOTIL) 2.5-0.025 MG tablet Take 2 tablets by mouth 4 (four) times daily as  needed for diarrhea or loose stools. 40 tablet 0  . donepezil (ARICEPT) 10 MG tablet Take 1 tablet (10 mg total) by mouth at bedtime. 90 tablet 3  . fluorometholone (FML) 0.1 % ophthalmic suspension     . furosemide (LASIX) 40 MG tablet     . ketorolac (ACULAR) 0.4 % SOLN     . loperamide (IMODIUM A-D) 2 MG tablet Take 2 mg by mouth 4 (four) times daily as needed. For diarrhea    . losartan (COZAAR) 50 MG tablet Take 50 mg by mouth daily.    . memantine (NAMENDA) 10 MG tablet Take 1 tablet (10 mg total) by mouth 2 (two) times daily. 180 tablet 3  . Multiple Vitamins-Minerals (PRESERVISION AREDS PO) Take by mouth daily.    Marland Kitchen ofloxacin (OCUFLOX) 0.3 % ophthalmic solution     . potassium chloride (K-DUR,KLOR-CON) 10 MEQ tablet Take 10 mEq by mouth daily.    . pregabalin (LYRICA) 75 MG capsule Take 75 mg by mouth every 6 (six) hours.    . Probiotic Product (PROBIOTIC PO) Take by mouth daily.    . propranolol (INDERAL) 80 MG tablet Take 80 mg by mouth 2 (two) times daily.     No facility-administered medications prior to visit.     PAST MEDICAL HISTORY: Past Medical History:  Diagnosis Date  . Abdominal pain   . Anginal pain [facility])    sees [name]; reportedly negative stress/echo 2012  . Arthritis   . Bronchitis   . Cancer [facility])    breast cancer  . Carpal tunnel syndrome    bilaterally  . Cataracts, bilateral   . Complication of anesthesia    "difficutly waking up and hypothermia and stopped breathing"  . Confusion   . Dementia    early stages per patient  . Dementia    "early stages per patient" and long term memory loss  . Depression   . Diverticulosis   . Esophageal reflux   . Gallstones   . Hypertension    sees [name]  . IBS (irritable bowel syndrome)   . Irritable bowel   . Migraines   . Nausea   . Osteoporosis   . PONV (postoperative nausea and vomiting)   . Refusal of blood transfusions as patient is Jehovah's Witness     PAST SURGICAL  HISTORY: Past Surgical History:  Procedure Laterality Date  . ANKLE SURGERY    . BREAST SURGERY     x 4  . CARDIOVASCULAR STRESS TEST    . CHOLECYSTECTOMY  10/04/2012   Procedure: LAPAROSCOPIC CHOLECYSTECTOMY WITH INTRAOPERATIVE CHOLANGIOGRAM;  Surgeon: Adin Hector, MD;  Location: [place];  Service: General;  Laterality: N/A;  laparosopic cholecystectomy with cholangiogram and repair of umbilical hernia  . COLONOSCOPY  08/30/11  . DILATION AND CURETTAGE OF UTERUS    . DOPPLER ECHOCARDIOGRAPHY     hx   . ERCP  10/05/2012   Procedure: ENDOSCOPIC RETROGRADE CHOLANGIOPANCREATOGRAPHY (ERCP);  Surgeon: Jeryl Columbia,  MD;  Location: [facility];  Service: Endoscopy;  Laterality: N/A;  . ESOPHAGOGASTRODUODENOSCOPY  08/30/11  . JOINT REPLACEMENT     knee - right  . KELOID EXCISION     from bilateral earlobes  . KNEE SURGERY    . LAPAROSCOPIC CHOLECYSTECTOMY W/ CHOLANGIOGRAPHY  10/04/2012  . SHOULDER SURGERY  06/2011  . SKIN GRAFT    . TUBAL LIGATION    . WRIST SURGERY  1995    FAMILY HISTORY: Family History  Problem Relation Age of Onset  . Colon polyps Mother   . Breast cancer Sister   . Dementia Sister     SOCIAL HISTORY: Social History   Socioeconomic History  . Marital status: Widowed    Spouse name: Not on file  . Number of children: 2  . Years of education: 34  . Highest education level: Not on file  Social Needs  . Financial resource strain: Not on file  . Food insecurity - worry: Not on file  . Food insecurity - inability: Not on file  . Transportation needs - medical: Not on file  . Transportation needs - non-medical: Not on file  Occupational History  . Occupation: Press photographer    Comment: Retired  Tobacco Use  . Smoking status: Never Smoker  . Smokeless tobacco: Never Used  Substance and Sexual Activity  . Alcohol use: No  . Drug use: No  . Sexual activity: Not on file  Other Topics Concern  . Not on file  Social History Narrative   Patient lives at home  with her father and he is blind. Patient is retired. Patient is widowed and has two children.   Right handed.   Caffeine- None     PHYSICAL EXAM  Vitals:   12/05/17 1406  BP: (!) 145/78  Pulse: 62  Weight: 218 lb 12.8 oz (99.2 kg)   Body mass index is 35.32 kg/m.  Generalized: Well developed,  Obese female in no acute distress  Head: normocephalic and atraumatic,. Oropharynx benign  Neck: Supple, no carotid bruits  Cardiac: Regular rate rhythm, no murmur  Musculoskeletal: No deformity  Skin  Multiple keloids, 1+ pitting edema of ankles  Neurological examination   Mentation: Alert , AFT 17. Clock drawing 3/4 MMSE - Mini Mental State Exam 12/05/2017 04/04/2017 03/27/2016  Orientation to time 5 4 4   Orientation to Place 5 5 5   Registration 3 3 3   Attention/ Calculation 2 5 5   Recall 3 2 2   Language- name 2 objects 2 2 2   Language- repeat 0 1 1  Language- follow 3 step command 3 3 3   Language- read & follow direction 1 1 1   Write a sentence 1 1 1   Copy design 1 1 1   Total score 26 28 28    Follows all commands speech and language fluent.   Cranial nerve II-XII: .Pupils were equal round reactive to light extraocular movements were full, visual field were full on confrontational test. She has bilateral  conjunctiva erythematous.  Facial sensation and strength were normal. hearing was intact to finger rubbing bilaterally. Uvula tongue midline. head turning and shoulder shrug were normal and symmetric.Tongue protrusion into cheek strength was normal. Motor: normal bulk and tone, full strength in the BUE, BLE, fine finger movements normal, no pronator drift. No focal weakness Sensory:  Length dependent decreased light touch, pinprick, and  Vibration,  To shins Coordination: finger-nose-finger, heel-to-shin bilaterally, no dysmetria Reflexes:  Symmetric upper and lower, plantar responses were flexor bilaterally. Gait and Station: Rising  up from seated position without assistance,   Ambulates with single-point cane , unsteady gait, scoliois right knee valgrus   DIAGNOSTIC DATA (LABS, IMAGING, TESTING) - I reviewed patient records, labs, notes, testing and imaging myself where available.  Lab Results  Component Value Date   WBC 7.2 03/14/2016   HGB 13.3 03/14/2016   HCT 40.9 03/14/2016   MCV 93.2 03/14/2016   PLT 187 03/14/2016      Component Value Date/Time   NA 138 03/14/2016 2002   NA 139 01/11/2016 1417   K 3.1 (L) 03/14/2016 2002   CL 107 03/14/2016 2002   CO2 21 (L) 03/14/2016 2002   GLUCOSE 102 (H) 03/14/2016 2002   BUN 6 03/14/2016 2002   BUN 10 01/11/2016 1417   CREATININE 0.95 03/14/2016 2002   CALCIUM 9.3 03/14/2016 2002   PROT 7.5 03/14/2016 2002   PROT 7.1 01/11/2016 1417   ALBUMIN 3.5 03/14/2016 2002   ALBUMIN 3.9 01/11/2016 1417   AST 24 03/14/2016 2002   ALT 21 03/14/2016 2002   ALKPHOS 131 (H) 03/14/2016 2002   BILITOT 0.5 03/14/2016 2002   BILITOT 0.3 01/11/2016 1417   GFRNONAA 58 (L) 03/14/2016 2002   GFRAA >60 03/14/2016 2002     ASSESSMENT AND PLAN  77 y.o. year old female  has a past medical history of  Refusal of blood transfusions as patient is Jehovah's Witness.  With diagnosis of  Bilateral glaucoma,  C-reactive protein elevated. Improved with prednisome. Pt refused temporal bx. Headaches are improved and no longer an issue Mild cognitive impairment, MMSE stable ,diffuse body aches and multifactorial gait disorder here to follow-up                 PLAN:  Memory score is stable continue Aricept and Namenda Take Aricept daily in the am with breakfast Gait disorder use cane at all times to prevent falls her gait disorder is multifactorial due to bilateral knee pain and back pain scoliosis left and right knee pain.  Follow up 8 months I spent 25 minutes in total face to face time with the patient more than 50% of which was spent counseling and coordination of care, reviewing test results reviewing medications and  discussing and reviewing the diagnosis of headaches, mild cognitive impairment and gait disorder.  Follow a structured environment try not to multitask use a daily organizer.  Change Aricept to in the morning.  This may be causing your hallucinations and vivid dreaming. [facility] [address] [place], [place] [address] [phone]

## 2017-12-05 ENCOUNTER — Encounter (INDEPENDENT_AMBULATORY_CARE_PROVIDER_SITE_OTHER): Payer: Self-pay

## 2017-12-05 ENCOUNTER — Ambulatory Visit (INDEPENDENT_AMBULATORY_CARE_PROVIDER_SITE_OTHER): Payer: Medicare Other | Admitting: Nurse Practitioner

## 2017-12-05 ENCOUNTER — Encounter: Payer: Self-pay | Admitting: Nurse Practitioner

## 2017-12-05 VITALS — BP 145/78 | HR 62 | Wt 218.8 lb

## 2017-12-05 DIAGNOSIS — G3184 Mild cognitive impairment, so stated: Secondary | ICD-10-CM

## 2017-12-05 DIAGNOSIS — R269 Unspecified abnormalities of gait and mobility: Secondary | ICD-10-CM

## 2017-12-05 NOTE — Patient Instructions (Signed)
Memory score is stable continue Aricept and Namenda Take Aricept daily in the am with breakfast Gait disorder use cane at all times to prevent falls her gait disorder is multifactorial due to bilateral knee pain and back pain scoliosis left and right knee pain.  Follow up 8 months

## 2017-12-06 NOTE — Progress Notes (Signed)
I have reviewed and agreed above plan. 

## 2018-04-19 ENCOUNTER — Telehealth: Payer: Self-pay | Admitting: *Deleted

## 2018-04-19 NOTE — Telephone Encounter (Signed)
   [facility] Pre-operative Risk Assessment    Request for surgical clearance:  1. What type of surgery is being performed? LEFT TOTAL HIP REPLACEMENT    2. When is this surgery scheduled? TBD   3. What type of clearance is required (medical clearance vs. Pharmacy clearance to hold med vs. Both)? BOTH  4. Are there any medications that need to be held prior to surgery and how long?  5. Practice name and name of physician performing surgery? [facility]   6. What is your office phone number [phone]     7.   What is your office fax number  [phone] ATTN: KATHY        BLUME   8.   Anesthesia type (None, local, MAC,  general) ?

## 2018-04-22 NOTE — Telephone Encounter (Signed)
This pt hasn't been seen here in years.    Primary Cardiologist: Dr Sallyanne Kuster  Chart reviewed as part of pre-operative protocol coverage. Because of Ashley Hanson's past medical history and time since last visit, he/she will require a follow-up visit in order to better assess preoperative cardiovascular risk.  Pre-op covering staff: - Please schedule appointment and call patient to inform them. - Please contact requesting surgeon's office via preferred method (i.e, phone, fax) to inform them of need for appointment prior to surgery.  Kerin Ransom, PA-C  04/22/2018, 2:52 PM

## 2018-04-22 NOTE — Telephone Encounter (Signed)
Appointment made and patient notified directly.  

## 2018-04-30 NOTE — [facility] Notes (Signed)
Cardiology Office Note   Date:  05/01/2018   ID:  Ashley Hanson, DOB [birth year], MRN [id number]  PCP:  Levin Erp, MD  Cardiologist:  Su Grand Dr.Weintraub   Chief Complaint  Patient presents with  . Medical Clearance     History of Present Illness: Ashley Hanson is a 77 y.o. female who presents for cardiac preoperative evaluation for left total hip replacement by [facility], Dr. Ulyses Southward.    She has a history of coronary artery disease with PTCA, with low risk Lexiscan Myoview on 01/15/2013, revealing small, mild perfusion abnormality.  Most recent cardiac catheterization completed 01/07/2004 revealing essentially normal coronary arteries and left ventricular systolic function.  She was found to have mild mid muscular bridging with mild systolic compression on angiography with no fixed atherosclerotic disease.  Echocardiogram dated 08/17/2017 revealed normal LV systolic function 82% to 50%.  Mild mitral valve regurgitation, the left atrium was mildly dilated, atrial septum demonstrated that a patent foramen ovale could not be excluded.   The patient comes today with complaints of chest pain, dizziness, dyspnea on exertion, and generalized fatigue.  The patient states that she is having trouble with ambulation due to her hip pain, but if she does any activity she becomes diaphoretic short of breath and feels chest pressure.  Past Medical History:  Diagnosis Date  . Abdominal pain   . Anginal pain [facility])    sees [name]; reportedly negative stress/echo 2012  . Arthritis   . Bronchitis   . Cancer [facility])    breast cancer  . Carpal tunnel syndrome    bilaterally  . Cataracts, bilateral   . Complication of anesthesia    "difficutly waking up and hypothermia and stopped breathing"  . Confusion   . Dementia    early stages per patient  . Dementia    "early stages per patient" and long term memory loss  . Depression   . Diverticulosis   . Esophageal reflux   .  Gallstones   . Hypertension    sees [name]  . IBS (irritable bowel syndrome)   . Irritable bowel   . Migraines   . Nausea   . Osteoporosis   . PONV (postoperative nausea and vomiting)   . Refusal of blood transfusions as patient is Jehovah's Witness     Past Surgical History:  Procedure Laterality Date  . ANKLE SURGERY    . BREAST SURGERY     x 4  . CARDIOVASCULAR STRESS TEST    . CHOLECYSTECTOMY  10/04/2012   Procedure: LAPAROSCOPIC CHOLECYSTECTOMY WITH INTRAOPERATIVE CHOLANGIOGRAM;  Surgeon: Adin Hector, MD;  Location: [place];  Service: General;  Laterality: N/A;  laparosopic cholecystectomy with cholangiogram and repair of umbilical hernia  . COLONOSCOPY  08/30/11  . DILATION AND CURETTAGE OF UTERUS    . DOPPLER ECHOCARDIOGRAPHY     hx   . ERCP  10/05/2012   Procedure: ENDOSCOPIC RETROGRADE CHOLANGIOPANCREATOGRAPHY (ERCP);  Surgeon: Jeryl Columbia, MD;  [facility] ENDOSCOPY;  Service: Endoscopy;  Laterality: N/A;  . ESOPHAGOGASTRODUODENOSCOPY  08/30/11  . JOINT REPLACEMENT     knee - right  . KELOID EXCISION     from bilateral earlobes  . KNEE SURGERY    . LAPAROSCOPIC CHOLECYSTECTOMY W/ CHOLANGIOGRAPHY  10/04/2012  . SHOULDER SURGERY  06/2011  . SKIN GRAFT    . TUBAL LIGATION    . WRIST SURGERY  1995     Current Outpatient Medications  Medication Sig Dispense Refill  . Acetaminophen (  TYLENOL PO) Take by mouth as needed.    . ALPRAZolam (XANAX) 0.25 MG tablet Take one tab in am, one tab in afternoon, and two tabs at bedtime.    . Aspirin 81 MG EC tablet Take 81 mg by mouth daily.    . Calcium Carbonate-Vit D-Min (CALCIUM 1200 PO) Take 1 tablet by mouth 2 (two) times daily.    . Cholecalciferol (VITAMIN D-3 PO) Take 1 tablet by mouth daily.    Marland Kitchen diltiazem (CARDIZEM CD) 120 MG 24 hr capsule     . diphenoxylate-atropine (LOMOTIL) 2.5-0.025 MG tablet Take 2 tablets by mouth 4 (four) times daily as needed for diarrhea or loose stools. 40 tablet 0  .  donepezil (ARICEPT) 10 MG tablet Take 1 tablet (10 mg total) by mouth at bedtime. 90 tablet 3  . fluorometholone (FML) 0.1 % ophthalmic suspension     . furosemide (LASIX) 40 MG tablet     . ketorolac (ACULAR) 0.4 % SOLN     . loperamide (IMODIUM A-D) 2 MG tablet Take 2 mg by mouth 4 (four) times daily as needed. For diarrhea    . losartan (COZAAR) 50 MG tablet Take 50 mg by mouth daily.    . memantine (NAMENDA) 10 MG tablet Take 1 tablet (10 mg total) by mouth 2 (two) times daily. 180 tablet 3  . Multiple Vitamins-Minerals (PRESERVISION AREDS PO) Take by mouth daily.    Marland Kitchen ofloxacin (OCUFLOX) 0.3 % ophthalmic solution     . potassium chloride (K-DUR,KLOR-CON) 10 MEQ tablet Take 10 mEq by mouth daily.    . pregabalin (LYRICA) 75 MG capsule Take 75 mg by mouth every 6 (six) hours.    . Probiotic Product (PROBIOTIC PO) Take by mouth daily.    . propranolol (INDERAL) 80 MG tablet Take 80 mg by mouth 2 (two) times daily.     No current facility-administered medications for this visit.     Allergies:   Plavix [clopidogrel bisulfate]; Warfarin and related; and Adhesive [tape]    Social History:  The patient  reports that she has never smoked. She has never used smokeless tobacco. She reports that she does not drink alcohol or use drugs.   Family History:  The patient's family history includes Breast cancer in her sister; Colon polyps in her mother; Dementia in her sister.    ROS: All other systems are reviewed and negative. Unless otherwise mentioned in H&P    PHYSICAL EXAM: VS:  BP 119/63   Pulse (!) 48   Ht 5\' 6"  (1.676 m)   Wt 226 lb 9.6 oz (102.8 kg)   BMI 36.57 kg/m  , BMI Body mass index is 36.57 kg/m. GEN: Well nourished, well developed, in no acute distress  HEENT: normal  Neck: no JVD, carotid bruits, or masses Cardiac: RRR; bradycardic no murmurs, rubs, or gallops,no edema  Respiratory:  clear to auscultation bilaterally, normal work of breathing GI: soft, nontender,  nondistended, + BS MS: no deformity or atrophy  Skin: warm and dry, no rash Neuro:  Strength and sensation are intact Psych: euthymic mood, full affect   EKG:  Sinus bradycardia rate of 39 bpm, LVH  Is noted.   Recent Labs: No results found for requested labs within last 8760 hours.    Lipid Panel No results found for: CHOL, TRIG, HDL, CHOLHDL, VLDL, LDLCALC, LDLDIRECT    Wt Readings from Last 3 Encounters:  05/01/18 226 lb 9.6 oz (102.8 kg)  12/05/17 218 lb 12.8 oz (99.2 kg)  04/04/17 197 lb (89.4 kg)      Other studies Reviewed:  Echocardiogram 08/17/2018  Left ventricle: The cavity size was normal. Systolic function was   normal. The estimated ejection fraction was in the range of 55%   to 60%. Wall motion was normal; there were no regional wall   motion abnormalities. Left ventricular diastolic function   parameters were normal. - Mitral valve: There was mild regurgitation. - Left atrium: The atrium was mildly dilated. - Atrial septum: A patent foramen ovale cannot be excluded   ASSESSMENT AND PLAN:  1.  Preoperative cardiac evaluation: The patient is not cleared for surgery at this time as she is symptomatic with dizziness, shortness of breath, chest pain, diaphoresis and fatigue with minimal exertion.  I will plan a Lexiscan Myoview and echocardiogram to evaluate for changes in LV function and ischemia.  If tests are negative, will likely be able to let her move forward with surgery.   2.  Bradycardia: The patient's heart rate is in the 40s at rest.  I am going to decrease propanolol from 80 mg twice daily to 60 mg twice daily in hopes of increasing heart rate and assisting with symptoms of dizziness.  May consider titrating further down if heart rate is not improved.  3.  Hypertension: The patient continues on losartan, diltiazem,.  Due to complaints of dizziness with position change I am going to decrease her Lasix to 20 mg daily from 40 mg daily as she has had  recent echocardiogram revealing normal LV systolic function in the past.  She has no complaints of lower extremity edema.  Follow-up labs to include CBC, BMET, and magnesium are ordered.  Current medicines are reviewed at length with the patient today.    Labs/ tests ordered today include: Echocardiogram, Lexiscan Myoview, CBC, BMET, and magnesium.  Phill Myron. West Pugh, ANP, AACC   05/01/2018 2:39 PM    [facility] [address] [place], [place] [address] Phone: [phone]; Fax: [phone]

## 2018-05-01 ENCOUNTER — Encounter: Payer: Self-pay | Admitting: Adult Health

## 2018-05-01 ENCOUNTER — Ambulatory Visit (INDEPENDENT_AMBULATORY_CARE_PROVIDER_SITE_OTHER): Payer: Medicare Other | Admitting: Adult Health

## 2018-05-01 VITALS — BP 119/63 | HR 48 | Ht 66.0 in | Wt 226.6 lb

## 2018-05-01 DIAGNOSIS — Z79899 Other long term (current) drug therapy: Secondary | ICD-10-CM | POA: Diagnosis not present

## 2018-05-01 DIAGNOSIS — R0602 Shortness of breath: Secondary | ICD-10-CM | POA: Diagnosis not present

## 2018-05-01 DIAGNOSIS — R079 Chest pain, unspecified: Secondary | ICD-10-CM

## 2018-05-01 DIAGNOSIS — R001 Bradycardia, unspecified: Secondary | ICD-10-CM | POA: Diagnosis not present

## 2018-05-01 DIAGNOSIS — I5189 Other ill-defined heart diseases: Secondary | ICD-10-CM

## 2018-05-01 DIAGNOSIS — Z01810 Encounter for preprocedural cardiovascular examination: Secondary | ICD-10-CM | POA: Diagnosis not present

## 2018-05-01 MED ORDER — PROPRANOLOL HCL 60 MG PO TABS: 60.0000 mg | ORAL_TABLET | Freq: Two times a day (BID) | ORAL | 3 refills | Status: DC

## 2018-05-01 NOTE — Patient Instructions (Signed)
Medication Instructions:  DECREASE PROPRANOLOL 60MG  TWICE DAILY  If you need a refill on your cardiac medications before your next appointment, please call your pharmacy.  Labwork: CBC,BMET,TSH AND MAG TODAY HERE IN OUR OFFICE AT [facility]  Take the provided lab slips with you to the lab for your blood draw.   Testing/Procedures: Echocardiogram - Your physician has requested that you have an echocardiogram. Echocardiography is a painless test that uses sound waves to create images of your heart. It provides your doctor with information about the size and shape of your heart and how well your heart's chambers and valves are working. This procedure takes approximately one hour. There are no restrictions for this procedure. This will be performed at our [facility] location - [address].  Your physician has requested that you have a lexiscan myoview. A cardiac stress test is a cardiological test that measures the heart's ability to respond to external stress in a controlled clinical environment. The stress response is induced byintravenous pharmacological stimulation. For further information please visit [facility]. Please follow instructions below. How to prepare for your Myocardial Perfusion Test:  Hold: LASIX the day of the testing  Do not eat or drink 3 hours prior to your test, except you may have water.  Do not consume products containing caffeine (regular or decaffeinated) 12 hours prior to your test. (ex: coffee, chocolate, sodas, tea).  Do wear comfortable clothes (no dresses or overalls) and walking shoes, tennis shoes preferred (No heels or open toe shoes are allowed).  Do NOT wear cologne, perfume, aftershave, or lotions (deodorant is allowed).  If these instructions are not followed, your test will have to be rescheduled.  If you have questions or concerns about your appointment, you can call the Nuclear Lab at [phone].  Special Instructions: NOT  CLEARED FOR SURGERY UNTIL TESTING IS PASSED  Follow-Up: Your physician wants you to follow-up in: 2 WEEKS(AFTER TESTING)DR CROITORU -OR- KATHRYN LAWRENCE (NURSE PRACTIONIER), DNP,AACC IF PRIMARY CARDIOLOGIST IS UNAVAILABLE.    Thank you for choosing [facility] at [facility]!!      DEC3

## 2018-05-02 LAB — CBC
Hematocrit: 39 % (ref 34.0–46.6)
Hemoglobin: 13 g/dL (ref 11.1–15.9)
MCH: 30.4 pg (ref 26.6–33.0)
MCHC: 33.3 g/dL (ref 31.5–35.7)
MCV: 91 fL (ref 79–97)
Platelets: 180 10*3/uL (ref 150–450)
RBC: 4.28 x10E6/uL (ref 3.77–5.28)
RDW: 14.6 % (ref 12.3–15.4)
WBC: 8.5 10*3/uL (ref 3.4–10.8)

## 2018-05-02 LAB — BASIC METABOLIC PANEL
BUN/Creatinine Ratio: 18 (ref 12–28)
BUN: 15 mg/dL (ref 8–27)
CO2: 20 mmol/L (ref 20–29)
Calcium: 9.8 mg/dL (ref 8.7–10.3)
Chloride: 103 mmol/L (ref 96–106)
Creatinine, Ser: 0.85 mg/dL (ref 0.57–1.00)
GFR calc Af Amer: 76 mL/min/{1.73_m2} (ref >59)
GFR calc non Af Amer: 66 mL/min/{1.73_m2} (ref >59)
Glucose: 100 mg/dL — ABNORMAL HIGH (ref 65–99)
Potassium: 3.8 mmol/L (ref 3.5–5.2)
Sodium: 141 mmol/L (ref 134–144)

## 2018-05-02 LAB — MAGNESIUM: Magnesium: 2.2 mg/dL (ref 1.6–2.3)

## 2018-05-02 LAB — TSH: TSH: 5.07 u[IU]/mL — ABNORMAL HIGH (ref 0.450–4.500)

## 2018-05-07 ENCOUNTER — Ambulatory Visit (HOSPITAL_COMMUNITY): Payer: Medicare Other | Attending: Cardiovascular Disease

## 2018-05-07 ENCOUNTER — Other Ambulatory Visit: Payer: Self-pay

## 2018-05-07 DIAGNOSIS — R0602 Shortness of breath: Secondary | ICD-10-CM | POA: Diagnosis present

## 2018-05-07 DIAGNOSIS — I2510 Atherosclerotic heart disease of native coronary artery without angina pectoris: Secondary | ICD-10-CM | POA: Insufficient documentation

## 2018-05-07 DIAGNOSIS — I340 Nonrheumatic mitral (valve) insufficiency: Secondary | ICD-10-CM | POA: Diagnosis not present

## 2018-05-07 DIAGNOSIS — R079 Chest pain, unspecified: Secondary | ICD-10-CM | POA: Diagnosis not present

## 2018-05-07 DIAGNOSIS — I5189 Other ill-defined heart diseases: Secondary | ICD-10-CM

## 2018-05-07 DIAGNOSIS — I110 Hypertensive heart disease with heart failure: Secondary | ICD-10-CM | POA: Insufficient documentation

## 2018-05-07 DIAGNOSIS — I509 Heart failure, unspecified: Secondary | ICD-10-CM | POA: Insufficient documentation

## 2018-05-10 ENCOUNTER — Telehealth (HOSPITAL_COMMUNITY): Payer: Self-pay

## 2018-05-10 NOTE — Telephone Encounter (Signed)
Encounter complete. 

## 2018-05-15 ENCOUNTER — Ambulatory Visit (HOSPITAL_COMMUNITY)
Admission: RE | Admit: 2018-05-15 | Discharge: 2018-05-15 | Disposition: A | Discharge status: Home / Self Care | Payer: Medicare Other | Source: Ambulatory Visit | Attending: Cardiovascular Disease | Admitting: Cardiovascular Disease

## 2018-05-15 DIAGNOSIS — R001 Bradycardia, unspecified: Secondary | ICD-10-CM | POA: Diagnosis not present

## 2018-05-15 DIAGNOSIS — R0602 Shortness of breath: Secondary | ICD-10-CM | POA: Diagnosis not present

## 2018-05-15 DIAGNOSIS — R079 Chest pain, unspecified: Secondary | ICD-10-CM

## 2018-05-15 DIAGNOSIS — R61 Generalized hyperhidrosis: Secondary | ICD-10-CM | POA: Diagnosis not present

## 2018-05-15 DIAGNOSIS — R0609 Other forms of dyspnea: Secondary | ICD-10-CM | POA: Insufficient documentation

## 2018-05-15 DIAGNOSIS — I2510 Atherosclerotic heart disease of native coronary artery without angina pectoris: Secondary | ICD-10-CM | POA: Diagnosis not present

## 2018-05-15 DIAGNOSIS — E669 Obesity, unspecified: Secondary | ICD-10-CM | POA: Diagnosis not present

## 2018-05-15 DIAGNOSIS — R5383 Other fatigue: Secondary | ICD-10-CM | POA: Diagnosis not present

## 2018-05-15 DIAGNOSIS — F0390 Unspecified dementia without behavioral disturbance: Secondary | ICD-10-CM | POA: Insufficient documentation

## 2018-05-15 DIAGNOSIS — Z6837 Body mass index (BMI) 37.0-37.9, adult: Secondary | ICD-10-CM | POA: Insufficient documentation

## 2018-05-15 DIAGNOSIS — I119 Hypertensive heart disease without heart failure: Secondary | ICD-10-CM | POA: Diagnosis not present

## 2018-05-15 DIAGNOSIS — R42 Dizziness and giddiness: Secondary | ICD-10-CM | POA: Diagnosis not present

## 2018-05-15 DIAGNOSIS — Z01818 Encounter for other preprocedural examination: Secondary | ICD-10-CM | POA: Diagnosis not present

## 2018-05-15 DIAGNOSIS — I252 Old myocardial infarction: Secondary | ICD-10-CM | POA: Diagnosis not present

## 2018-05-15 DIAGNOSIS — I5189 Other ill-defined heart diseases: Secondary | ICD-10-CM | POA: Diagnosis not present

## 2018-05-15 MED ORDER — REGADENOSON 0.4 MG/5ML IV SOLN
0.4000 mg | Freq: Once | INTRAVENOUS | Status: AC
  Administered 2018-05-15: 0.4 mg via INTRAVENOUS

## 2018-05-15 MED ORDER — TECHNETIUM TC 99M TETROFOSMIN IV KIT
27.7000 | PACK | Freq: Once | INTRAVENOUS | Status: AC | PRN
  Administered 2018-05-15: 27.7 via INTRAVENOUS
  Filled 2018-05-15: qty 28

## 2018-05-16 ENCOUNTER — Ambulatory Visit (HOSPITAL_COMMUNITY)
Admission: RE | Admit: 2018-05-16 | Discharge: 2018-05-16 | Disposition: A | Discharge status: Home / Self Care | Payer: Medicare Other | Source: Ambulatory Visit | Attending: Cardiovascular Disease | Admitting: Cardiovascular Disease

## 2018-05-16 LAB — MYOCARDIAL PERFUSION IMAGING
LV dias vol: 101 mL (ref 46–106)
LV sys vol: 56 mL
Peak HR: 77 {beats}/min
Rest HR: 47 {beats}/min
SDS: 0
SRS: 4
SSS: 4
TID: 1.15

## 2018-05-16 MED ORDER — TECHNETIUM TC 99M TETROFOSMIN IV KIT
32.0000 | PACK | Freq: Once | INTRAVENOUS | Status: AC | PRN
  Administered 2018-05-16: 32 via INTRAVENOUS

## 2018-05-16 MED ORDER — TECHNETIUM TC 99M TETROFOSMIN IV KIT: 32.0000 | PACK | Freq: Once | INTRAVENOUS | Status: DC | PRN

## 2018-05-29 ENCOUNTER — Ambulatory Visit (INDEPENDENT_AMBULATORY_CARE_PROVIDER_SITE_OTHER): Payer: Medicare Other | Admitting: Adult Health

## 2018-05-29 ENCOUNTER — Encounter: Payer: Self-pay | Admitting: Adult Health

## 2018-05-29 VITALS — BP 110/63 | HR 57 | Ht 65.5 in | Wt 232.4 lb

## 2018-05-29 DIAGNOSIS — I2510 Atherosclerotic heart disease of native coronary artery without angina pectoris: Secondary | ICD-10-CM | POA: Diagnosis not present

## 2018-05-29 DIAGNOSIS — Z01810 Encounter for preprocedural cardiovascular examination: Secondary | ICD-10-CM

## 2018-05-29 DIAGNOSIS — I10 Essential (primary) hypertension: Secondary | ICD-10-CM | POA: Diagnosis not present

## 2018-05-29 NOTE — [facility] Notes (Signed)
Cardiology Office Note   Date:  05/29/2018   ID:  Ashley Hanson, DOB 1941-11-11, MRN [id number]  PCP:  Levin Erp, MD  Cardiologist: Debara Pickett, (formerly [name])  Chief Complaint  Patient presents with  . Pre-op Exam     History of Present Illness: Ashley Hanson is a 77 y.o. female who presents for ongoing assessment CAD with PTCA with low risk Lexiscan on 12/2012 revealing a small mild perfusion abnormality.Catheterization demonstrating normal CAD.  She had complained about worsening dyspnea and dizziness, and chest pain. A Lexiscan Myoview and echocardiogram was ordered to evaluate her further before clearing her for surgery.   Study Highlights     Nuclear stress EF: 45%.  The left ventricular ejection fraction is mildly decreased (45-54%).  There was no ST segment deviation noted during stress.  The study is normal.  This is a low risk study.   Low risk stress nuclear study with normal perfusion and mildly reduced left ventricular global systolic function. Compared to 2014, perfusion pattern has normalized, but LVEF has decreased. Possible gating artifact, suggest correlation with echo.   Echocardiogram 05/07/2018  Left ventricle: The cavity size was normal. Wall thickness was   increased in a pattern of mild LVH. Systolic function was normal.   The estimated ejection fraction was in the range of 60% to 65%.   Wall motion was normal; there were no regional wall motion   abnormalities. Doppler parameters are consistent with abnormal   left ventricular relaxation (grade 1 diastolic dysfunction). - Mitral valve: There was mild regurgitation.  She is anxious to proceed with hip repair and has had no cardiac symptoms.    Past Medical History:  Diagnosis Date  . Abdominal pain   . Anginal pain [facility])    sees [name]; reportedly negative stress/echo 2012  . Arthritis   . Bronchitis   . Cancer [facility])    breast cancer  . Carpal tunnel syndrome    bilaterally  .  Cataracts, bilateral   . Complication of anesthesia    "difficutly waking up and hypothermia and stopped breathing"  . Confusion   . Dementia    early stages per patient  . Dementia    "early stages per patient" and long term memory loss  . Depression   . Diverticulosis   . Esophageal reflux   . Gallstones   . Hypertension    sees [name]  . IBS (irritable bowel syndrome)   . Irritable bowel   . Migraines   . Nausea   . Osteoporosis   . PONV (postoperative nausea and vomiting)   . Refusal of blood transfusions as patient is Jehovah's Witness     Past Surgical History:  Procedure Laterality Date  . ANKLE SURGERY    . BREAST SURGERY     x 4  . CARDIOVASCULAR STRESS TEST    . CHOLECYSTECTOMY  10/04/2012   Procedure: LAPAROSCOPIC CHOLECYSTECTOMY WITH INTRAOPERATIVE CHOLANGIOGRAM;  Surgeon: Adin Hector, MD;  Location: [facility];  Service: General;  Laterality: N/A;  laparosopic cholecystectomy with cholangiogram and repair of umbilical hernia  . COLONOSCOPY  08/30/11  . DILATION AND CURETTAGE OF UTERUS    . DOPPLER ECHOCARDIOGRAPHY     hx   . ERCP  10/05/2012   Procedure: ENDOSCOPIC RETROGRADE CHOLANGIOPANCREATOGRAPHY (ERCP);  Surgeon: Jeryl Columbia, MD;  Location: [facility] ENDOSCOPY;  Service: Endoscopy;  Laterality: N/A;  . ESOPHAGOGASTRODUODENOSCOPY  08/30/11  . JOINT REPLACEMENT     knee - right  . KELOID  EXCISION     from bilateral earlobes  . KNEE SURGERY    . LAPAROSCOPIC CHOLECYSTECTOMY W/ CHOLANGIOGRAPHY  10/04/2012  . SHOULDER SURGERY  06/2011  . SKIN GRAFT    . TUBAL LIGATION    . WRIST SURGERY  1995     Current Outpatient Medications  Medication Sig Dispense Refill  . Acetaminophen (TYLENOL PO) Take by mouth as needed.    . ALPRAZolam (XANAX) 0.25 MG tablet Take one tab in am, one tab in afternoon, and two tabs at bedtime.    . Aspirin 81 MG EC tablet Take 81 mg by mouth daily.    . Calcium Carbonate-Vit D-Min (CALCIUM 1200 PO) Take 1 tablet by mouth  2 (two) times daily.    . Cholecalciferol (VITAMIN D-3 PO) Take 1 tablet by mouth daily.    Marland Kitchen diltiazem (CARDIZEM CD) 120 MG 24 hr capsule     . diphenoxylate-atropine (LOMOTIL) 2.5-0.025 MG tablet Take 2 tablets by mouth 4 (four) times daily as needed for diarrhea or loose stools. 40 tablet 0  . donepezil (ARICEPT) 10 MG tablet Take 1 tablet (10 mg total) by mouth at bedtime. 90 tablet 3  . fluorometholone (FML) 0.1 % ophthalmic suspension     . furosemide (LASIX) 40 MG tablet     . ketorolac (ACULAR) 0.4 % SOLN     . loperamide (IMODIUM A-D) 2 MG tablet Take 2 mg by mouth 4 (four) times daily as needed. For diarrhea    . losartan (COZAAR) 50 MG tablet Take 50 mg by mouth daily.    . memantine (NAMENDA) 10 MG tablet Take 1 tablet (10 mg total) by mouth 2 (two) times daily. 180 tablet 3  . Multiple Vitamins-Minerals (PRESERVISION AREDS PO) Take by mouth daily.    Marland Kitchen ofloxacin (OCUFLOX) 0.3 % ophthalmic solution     . potassium chloride (K-DUR,KLOR-CON) 10 MEQ tablet Take 10 mEq by mouth daily.    . pregabalin (LYRICA) 75 MG capsule Take 75 mg by mouth every 6 (six) hours.    . Probiotic Product (PROBIOTIC PO) Take by mouth daily.    . propranolol (INDERAL) 60 MG tablet Take 1 tablet (60 mg total) by mouth 2 (two) times daily. 30 tablet 3   No current facility-administered medications for this visit.     Allergies:   Plavix [clopidogrel bisulfate]; Warfarin and related; and Adhesive [tape]    Social History:  The patient  reports that she has never smoked. She has never used smokeless tobacco. She reports that she does not drink alcohol or use drugs.   Family History:  The patient's family history includes Breast cancer in her sister; Colon polyps in her mother; Dementia in her sister.    ROS: All other systems are reviewed and negative. Unless otherwise mentioned in H&P    PHYSICAL EXAM: VS:  BP 110/63   Pulse (!) 57   Ht 5' 5.5" (1.664 m)   Wt 232 lb 6.4 oz (105.4 kg)   SpO2 96%    BMI 38.09 kg/m  , BMI Body mass index is 38.09 kg/m. GEN: Well nourished, well developed, in no acute distress sitting in a wheelchair.  HEENT: normal  Neck: no JVD, carotid bruits, or masses Cardiac: RRR; no murmurs, rubs, or gallops,no edema  Respiratory:  clear to auscultation bilaterally, normal work of breathing GI: soft, nontender, nondistended, + BS MS: no deformity or atrophy  Skin: warm and dry, no rash Neuro:  Strength and sensation are intact Psych: euthymic  mood, full affect   EKG:  Not completed.   Recent Labs: 05/01/2018: BUN 15; Creatinine, Ser 0.85; Hemoglobin 13.0; Magnesium 2.2; Platelets 180; Potassium 3.8; Sodium 141; TSH 5.070    Lipid Panel No results found for: CHOL, TRIG, HDL, CHOLHDL, VLDL, LDLCALC, LDLDIRECT    Wt Readings from Last 3 Encounters:  05/29/18 232 lb 6.4 oz (105.4 kg)  05/15/18 226 lb (102.5 kg)  05/01/18 226 lb 9.6 oz (102.8 kg)      Other studies Reviewed: As above.    ASSESSMENT AND PLAN:  1. Pre-Operative Cardiac Evaluation: Cardiac testing is reassuring and found to be essentially normal.  Note will be sent to Dr. Ulyses Southward informing him that she is cleared for surgery.   Chart reviewed as part of pre-operative protocol coverage. Given past medical history and time since last visit, based on ACC/AHA guidelines, Ashley Hanson would be at acceptable risk for the planned procedure without further cardiovascular testing.   2. CAD: Hx of PTCA, with cardiac cath in 2005 revealing essentially normal coronary arteries. She is without cardiac symptoms. She now believes her chest pain is musculoskeletal with keloid scars and arthritis pain. She is given reassurance that her echo and stress test were low risk.   3. Hypertension: Currently well controlled no changes in his medication regimen.    Current medicines are reviewed at length with the patient today.    Labs/ tests ordered today include: None  Phill Myron. West Pugh, ANP,  AACC   05/29/2018 3:33 PM    [facility] [address], [place], [place] [address] Phone: [phone]; Fax: [phone]

## 2018-05-29 NOTE — Patient Instructions (Signed)
Medication Instructions:  NO CHANGES- Your physician recommends that you continue on your current medications as directed. Please refer to the Current Medication list given to you today.  If you need a refill on your cardiac medications before your next appointment, please call your pharmacy.  Special Instructions: CLEARED FOR [place] WILL  NOTIFY  Follow-Up: Your physician wants you to follow-up in: [place] should receive a reminder letter in the mail two months in advance. If you do not receive a letter, please call our office MAY 2020 to schedule the July 2020 follow-up appointment.   Thank you for choosing [facility] at [facility]!!

## 2018-06-03 ENCOUNTER — Other Ambulatory Visit: Payer: Self-pay | Admitting: Neurology

## 2018-06-05 ENCOUNTER — Other Ambulatory Visit: Payer: Self-pay | Admitting: Orthopedic Surgery

## 2018-06-17 NOTE — Pre-Procedure Instructions (Signed)
Ashley Hanson  06/17/2018      [facility] - [place], [place] [address] Phone: [phone] Fax: [phone]  [facility] [address] [place] - [address] [place] [place] [address] Phone: [phone] Fax: [phone]    Your procedure is scheduled on Aug. 9  Report to [facility] Admitting at 5:30 A.M.  Call this number if you have problems the morning of surgery:  [phone]   Remember:   Do not eat or drink after midnight.                     Take these medicines the morning of surgery with A SIP OF WATER :               Xanax if needed             dittiazem (cardizem)             namenda             pregabalin (lyrica)  Inderal (propranolol)             Eye drops if needed                 7 days prior to surgery STOP taking  Aleve, Naproxen, Ibuprofen, Motrin, Advil, Goody's, BC's, all herbal medications, fish oil, and all vitamins                 Follow your surgeon's instructions on when to stop Asprin.  If no instructions were given by your surgeon then you will need to call the office to get those instructions.     Do not wear jewelry, make-up or nail polish.  Do not wear lotions, powders, or perfumes, or deodorant.  Do not shave 48 hours prior to surgery.  Men may shave face and neck.  Do not bring valuables to the hospital.  [facility] is not responsible for any belongings or valuables.  Contacts, dentures or bridgework may not be worn into surgery.  Leave your suitcase in the car.  After surgery it may be brought to your room.  For patients admitted to the hospital, discharge time will be determined by your treatment team.  Patients discharged the day of surgery will not be allowed to drive home.    Special instructions:   [place]- Preparing For Surgery  Before surgery, you can play an important role. Because skin is not  sterile, your skin needs to be as free of germs as possible. You can reduce the number of germs on your skin by washing with CHG (chlorahexidine gluconate) Soap before surgery.  CHG is an antiseptic cleaner which kills germs and bonds with the skin to continue killing germs even after washing.    Oral Hygiene is also important to reduce your risk of infection.  Remember - BRUSH YOUR TEETH THE MORNING OF SURGERY WITH YOUR REGULAR TOOTHPASTE  Please do not use if you have an allergy to CHG or antibacterial soaps. If your skin becomes reddened/irritated stop using the CHG.  Do not shave (including legs and underarms) for at least 48 hours prior to first CHG shower. It is OK to shave your face.  Please follow these instructions carefully.   1. Shower the NIGHT BEFORE SURGERY and the MORNING OF SURGERY with CHG.   2. If you chose to wash your hair, wash your  hair first as usual with your normal shampoo.  3. After you shampoo, rinse your hair and body thoroughly to remove the shampoo.  4. Use CHG as you would any other liquid soap. You can apply CHG directly to the skin and wash gently with a scrungie or a clean washcloth.   5. Apply the CHG Soap to your body ONLY FROM THE NECK DOWN.  Do not use on open wounds or open sores. Avoid contact with your eyes, ears, mouth and genitals (private parts). Wash Face and genitals (private parts)  with your normal soap.  6. Wash thoroughly, paying special attention to the area where your surgery will be performed.  7. Thoroughly rinse your body with warm water from the neck down.  8. DO NOT shower/wash with your normal soap after using and rinsing off the CHG Soap.  9. Pat yourself dry with a CLEAN TOWEL.  10. Wear CLEAN PAJAMAS to bed the night before surgery, wear comfortable clothes the morning of surgery  11. Place CLEAN SHEETS on your bed the night of your first shower and DO NOT SLEEP WITH PETS.    Day of Surgery:  Do not apply any  deodorants/lotions.  Please wear clean clothes to the hospital/surgery center.   Remember to brush your teeth WITH YOUR REGULAR TOOTHPASTE.    Please read over the following fact sheets that you were given. Coughing and Deep Breathing, MRSA Information and Surgical Site Infection Prevention

## 2018-06-18 ENCOUNTER — Ambulatory Visit (HOSPITAL_COMMUNITY)
Admission: RE | Admit: 2018-06-18 | Discharge: 2018-06-18 | Disposition: A | Discharge status: Home / Self Care | Payer: Medicare Other | Source: Ambulatory Visit | Attending: Orthopedic Surgery | Admitting: Orthopedic Surgery

## 2018-06-18 ENCOUNTER — Encounter (HOSPITAL_COMMUNITY)
Admission: RE | Admit: 2018-06-18 | Discharge: 2018-06-18 | Disposition: A | Discharge status: Home / Self Care | Payer: Medicare Other | Source: Ambulatory Visit | Attending: Orthopedic Surgery | Admitting: Orthopedic Surgery

## 2018-06-18 ENCOUNTER — Other Ambulatory Visit: Payer: Self-pay

## 2018-06-18 ENCOUNTER — Encounter (HOSPITAL_COMMUNITY): Payer: Self-pay

## 2018-06-18 DIAGNOSIS — K219 Gastro-esophageal reflux disease without esophagitis: Secondary | ICD-10-CM | POA: Insufficient documentation

## 2018-06-18 DIAGNOSIS — K589 Irritable bowel syndrome without diarrhea: Secondary | ICD-10-CM | POA: Diagnosis not present

## 2018-06-18 DIAGNOSIS — Z9049 Acquired absence of other specified parts of digestive tract: Secondary | ICD-10-CM | POA: Insufficient documentation

## 2018-06-18 DIAGNOSIS — Z853 Personal history of malignant neoplasm of breast: Secondary | ICD-10-CM | POA: Diagnosis not present

## 2018-06-18 DIAGNOSIS — R001 Bradycardia, unspecified: Secondary | ICD-10-CM | POA: Diagnosis not present

## 2018-06-18 DIAGNOSIS — M1611 Unilateral primary osteoarthritis, right hip: Secondary | ICD-10-CM | POA: Diagnosis not present

## 2018-06-18 DIAGNOSIS — I10 Essential (primary) hypertension: Secondary | ICD-10-CM | POA: Insufficient documentation

## 2018-06-18 DIAGNOSIS — Z79899 Other long term (current) drug therapy: Secondary | ICD-10-CM | POA: Insufficient documentation

## 2018-06-18 DIAGNOSIS — F0390 Unspecified dementia without behavioral disturbance: Secondary | ICD-10-CM | POA: Diagnosis not present

## 2018-06-18 DIAGNOSIS — Z01812 Encounter for preprocedural laboratory examination: Secondary | ICD-10-CM | POA: Insufficient documentation

## 2018-06-18 DIAGNOSIS — M810 Age-related osteoporosis without current pathological fracture: Secondary | ICD-10-CM | POA: Insufficient documentation

## 2018-06-18 DIAGNOSIS — Z7982 Long term (current) use of aspirin: Secondary | ICD-10-CM | POA: Diagnosis not present

## 2018-06-18 DIAGNOSIS — F329 Major depressive disorder, single episode, unspecified: Secondary | ICD-10-CM | POA: Diagnosis not present

## 2018-06-18 DIAGNOSIS — Z01818 Encounter for other preprocedural examination: Secondary | ICD-10-CM | POA: Insufficient documentation

## 2018-06-18 HISTORY — DX: Scoliosis, unspecified: M41.9

## 2018-06-18 LAB — BASIC METABOLIC PANEL
Anion gap: 14 (ref 5–15)
BUN: 17 mg/dL (ref 8–23)
CO2: 21 mmol/L — ABNORMAL LOW (ref 22–32)
Calcium: 9.7 mg/dL (ref 8.9–10.3)
Chloride: 107 mmol/L (ref 98–111)
Creatinine, Ser: 0.81 mg/dL (ref 0.44–1.00)
GFR calc Af Amer: >60 mL/min (ref >60)
GFR calc non Af Amer: >60 mL/min (ref >60)
Glucose, Bld: 88 mg/dL (ref 70–99)
Potassium: 3.4 mmol/L — ABNORMAL LOW (ref 3.5–5.1)
Sodium: 142 mmol/L (ref 135–145)

## 2018-06-18 LAB — CBC WITH DIFFERENTIAL/PLATELET
Abs Immature Granulocytes: 0 10*3/uL (ref 0.0–0.1)
Basophils Absolute: 0.1 10*3/uL (ref 0.0–0.1)
Basophils Relative: 1 %
Eosinophils Absolute: 0.7 10*3/uL (ref 0.0–0.7)
Eosinophils Relative: 8 %
HCT: 43.9 % (ref 36.0–46.0)
Hemoglobin: 14.1 g/dL (ref 12.0–15.0)
Immature Granulocytes: 0 %
Lymphocytes Relative: 31 %
Lymphs Abs: 2.7 10*3/uL (ref 0.7–4.0)
MCH: 30.3 pg (ref 26.0–34.0)
MCHC: 32.1 g/dL (ref 30.0–36.0)
MCV: 94.2 fL (ref 78.0–100.0)
Monocytes Absolute: 0.8 10*3/uL (ref 0.1–1.0)
Monocytes Relative: 9 %
Neutro Abs: 4.4 10*3/uL (ref 1.7–7.7)
Neutrophils Relative %: 51 %
Platelets: 191 10*3/uL (ref 150–400)
RBC: 4.66 MIL/uL (ref 3.87–5.11)
RDW: 13.5 % (ref 11.5–15.5)
WBC: 8.6 10*3/uL (ref 4.0–10.5)

## 2018-06-18 LAB — SURGICAL PCR SCREEN
MRSA, PCR: NEGATIVE
Staphylococcus aureus: NEGATIVE

## 2018-06-18 LAB — PROTIME-INR
INR: 1.19
Prothrombin Time: 15 seconds (ref 11.4–15.2)

## 2018-06-18 LAB — NO BLOOD PRODUCTS

## 2018-06-18 LAB — APTT: aPTT: 29 seconds (ref 24–36)

## 2018-06-18 NOTE — Progress Notes (Signed)
PCP -  Dr. Lance Muss Cardiologist - Jory Sims, NP  Chest x-ray - 06/18/18 EKG - 06/18/18 Stress Test - 05/16/18 ECHO - 05/07/18 Cardiac Cath - 07/20/15  Sleep Study - denies  Aspirin Instructions:Pt stated to stop ASA 1 week prior to surgery.   Anesthesia review: Yes  Patient denies shortness of breath, fever, cough and chest pain at PAT appointment   Patient verbalized understanding of instructions that were given to them at the PAT appointment. Patient was also instructed that they will need to review over the PAT instructions again at home before surgery.

## 2018-06-20 ENCOUNTER — Other Ambulatory Visit: Payer: Self-pay | Admitting: Orthopedic Surgery

## 2018-06-20 NOTE — Care Plan (Signed)
Spoke with patient prior to surgery. SHe will discharge to home with the following:   DME; state that she has at home  HHPT: [facility] visits  MD follow up: Dr. Mayer Camel 07/11/18 @ 1000.   At this time she will not need OPPT.   Please contact Ladell Heads, [place] with questions or if this plan should need to change   Thanks

## 2018-06-20 NOTE — Progress Notes (Signed)
Anesthesia Chart Review:  Case:  [id number] Date/Time:  06/28/18 0715   Procedure:  RIGHT TOTAL HIP ARTHROPLASTY ANTERIOR APPROACH (Right Hip)   Anesthesia type:  Spinal   Pre-op diagnosis:  RIGHT HIP OSTEOARTHRITIS   Location:  [facility] OR ROOM 06 / [facility]   Surgeon:  Frederik Pear, MD      DISCUSSION: 77 yo female never smoker. Pertinent hx includes PONV, GERD, Dementia, Anginal pain, Migraines, Depression, HTN, Refusal of blood products due to [place].  She was seen by Cardiology Jory Sims NP on 05/29/2018 for surgical clearance. Per her note "Cardiac testing is reassuring and found to be essentially normal.  Note will be sent to Dr. Ulyses Southward informing him that she is cleared for surgery. Chart reviewed as part of pre-operative protocol coverage. Given past medical history and time since last visit, based on ACC/AHA guidelines, Arshiya Jakes would be at acceptable risk for the planned procedure without further cardiovascular testing."  Anticipate she can proceed with surgery as planned barring acute status change.  VS: BP (!) 117/53 Comment: rechecked in r arm  Pulse (!) 54   Temp 36.8 C   Resp 20   Ht 5' 5.5" (1.664 m)   Wt 223 lb 1.6 oz (101.2 kg)   SpO2 98%   BMI 36.56 kg/m   PROVIDERS: Levin Erp, MD is PCP  Lyman Bishop, MD is Cardiologist last seen in office by Jory Sims NP on 05/29/2018   LABS: Labs reviewed: Acceptable for surgery. (all labs ordered are listed, but only abnormal results are displayed)  Labs Reviewed  BASIC METABOLIC PANEL - Abnormal; Notable for the following components:      Result Value   Potassium 3.4 (*)    CO2 21 (*)    All other components within normal limits  SURGICAL PCR SCREEN  APTT  CBC WITH DIFFERENTIAL/PLATELET  PROTIME-INR  NO BLOOD PRODUCTS     IMAGES: CHEST - 2 VIEW 06/18/2018  COMPARISON:  09/24/2012  FINDINGS: Surgical clips in the left axilla. Heart and mediastinal contours are within normal  limits. No focal opacities or effusions. No acute bony abnormality.  IMPRESSION: No active cardiopulmonary disease.   EKG: 06/18/2018: Sinus bradycardia. ST & T wave abnormality, consider inferior ischemia.    CV: Myocardial Perfusion Imaging 05/16/2018:  Nuclear stress EF: 45%.  The left ventricular ejection fraction is mildly decreased (45-54%).  There was no ST segment deviation noted during stress.  The study is normal.  This is a low risk study.   Low risk stress nuclear study with normal perfusion and mildly reduced left ventricular global systolic function. Compared to 2014, perfusion pattern has normalized, but LVEF has decreased. Possible gating artifact, suggest correlation with echo.  Echo 05/07/2018: Study Conclusions  - Left ventricle: The cavity size was normal. Wall thickness was   increased in a pattern of mild LVH. Systolic function was normal.   The estimated ejection fraction was in the range of 60% to 65%.   Wall motion was normal; there were no regional wall motion   abnormalities. Doppler parameters are consistent with abnormal   left ventricular relaxation (grade 1 diastolic dysfunction). - Mitral valve: There was mild regurgitation.  Past Medical History:  Diagnosis Date  . Abdominal pain   . Anginal pain [facility])    sees [name]; reportedly negative stress/echo 2012  . Arthritis   . Bronchitis   . Cancer [facility])    breast cancer  . Carpal tunnel syndrome  bilaterally  . Cataracts, bilateral   . Complication of anesthesia    "difficutly waking up and hypothermia and stopped breathing"  . Confusion   . Dementia    early stages per patient  . Dementia    "early stages per patient" and long term memory loss  . Depression   . Diverticulosis   . Esophageal reflux   . Gallstones   . Hypertension    sees [name]  . IBS (irritable bowel syndrome)   . Irritable bowel   . Migraines   . Nausea   . Osteoporosis   . PONV  (postoperative nausea and vomiting)   . Refusal of blood transfusions as patient is Jehovah's Witness   . Scoliosis     Past Surgical History:  Procedure Laterality Date  . ANKLE SURGERY    . BREAST SURGERY     x 4  . CARDIOVASCULAR STRESS TEST    . CHOLECYSTECTOMY  10/04/2012   Procedure: LAPAROSCOPIC CHOLECYSTECTOMY WITH INTRAOPERATIVE CHOLANGIOGRAM;  Surgeon: Adin Hector, MD;  Location: [place];  Service: General;  Laterality: N/A;  laparosopic cholecystectomy with cholangiogram and repair of umbilical hernia  . COLONOSCOPY  08/30/11  . DILATION AND CURETTAGE OF UTERUS    . DOPPLER ECHOCARDIOGRAPHY     hx   . ERCP  10/05/2012   Procedure: ENDOSCOPIC RETROGRADE CHOLANGIOPANCREATOGRAPHY (ERCP);  Surgeon: Jeryl Columbia, MD;  Location: [facility] ENDOSCOPY;  Service: Endoscopy;  Laterality: N/A;  . ESOPHAGOGASTRODUODENOSCOPY  08/30/11  . JOINT REPLACEMENT     knee - right  . KELOID EXCISION     from bilateral earlobes  . KNEE SURGERY    . LAPAROSCOPIC CHOLECYSTECTOMY W/ CHOLANGIOGRAPHY  10/04/2012  . SHOULDER SURGERY  06/2011  . SKIN GRAFT    . TUBAL LIGATION    . WRIST SURGERY  1995    MEDICATIONS: . gabapentin (NEURONTIN) 600 MG tablet  . ALPRAZolam (XANAX) 0.25 MG tablet  . Aspirin 81 MG EC tablet  . Calcium Carbonate-Vit D-Min (CALCIUM 1200 PO)  . Cholecalciferol (VITAMIN D-3) 1000 units CAPS  . diltiazem (CARDIZEM CD) 120 MG 24 hr capsule  . donepezil (ARICEPT) 10 MG tablet  . furosemide (LASIX) 40 MG tablet  . hypromellose (SYSTANE OVERNIGHT THERAPY) 0.3 % GEL ophthalmic ointment  . losartan (COZAAR) 50 MG tablet  . memantine (NAMENDA) 10 MG tablet  . Menthol, Topical Analgesic, (BIOFREEZE) 4 % GEL  . Multiple Vitamins-Minerals (PRESERVISION AREDS PO)  . potassium chloride (K-DUR,KLOR-CON) 10 MEQ tablet  . pregabalin (LYRICA) 300 MG capsule  . Probiotic Product (PROBIOTIC PO)  . propranolol (INDERAL) 60 MG tablet  . Propylene Glycol (SYSTANE COMPLETE) 0.6 % SOLN  .  sodium chloride (MURO 128) 2 % ophthalmic solution   No current facility-administered medications for this encounter.     Wynonia Musty [facility]/Anesthesiology Phone [phone] 06/20/2018 3:31 PM

## 2018-06-24 ENCOUNTER — Encounter (HOSPITAL_COMMUNITY)
Admission: RE | Admit: 2018-06-24 | Discharge: 2018-06-24 | Disposition: A | Discharge status: Home / Self Care | Payer: Medicare Other | Source: Ambulatory Visit | Attending: Orthopedic Surgery | Admitting: Orthopedic Surgery

## 2018-06-24 DIAGNOSIS — Z01812 Encounter for preprocedural laboratory examination: Secondary | ICD-10-CM | POA: Insufficient documentation

## 2018-06-24 LAB — URINALYSIS, ROUTINE W REFLEX MICROSCOPIC
Bilirubin Urine: NEGATIVE
Glucose, UA: NEGATIVE mg/dL
Hgb urine dipstick: NEGATIVE
Ketones, ur: NEGATIVE mg/dL
Leukocytes, UA: NEGATIVE
Nitrite: NEGATIVE
Protein, ur: NEGATIVE mg/dL
Specific Gravity, Urine: 1.019 (ref 1.005–1.030)
pH: 6 (ref 5.0–8.0)

## 2018-06-25 DIAGNOSIS — M1612 Unilateral primary osteoarthritis, left hip: Secondary | ICD-10-CM | POA: Diagnosis present

## 2018-06-25 NOTE — H&P (Signed)
TOTAL HIP ADMISSION H&P  Patient is admitted for left total hip arthroplasty.  Subjective:  Chief Complaint: left hip pain  HPI: Ashley Hanson, 77 y.o. female, has a history of pain and functional disability in the left hip(s) due to arthritis and patient has failed non-surgical conservative treatments for greater than 12 weeks to include NSAID's and/or analgesics, use of assistive devices, weight reduction as appropriate and activity modification.  Onset of symptoms was gradual starting 1 years ago with gradually worsening course since that time.The patient noted no past surgery on the left hip(s).  Patient currently rates pain in the left hip at 10 out of 10 with activity. Patient has night pain, worsening of pain with activity and weight bearing, trendelenberg gait, pain that interfers with activities of daily living and pain with passive range of motion. Patient has evidence of joint space narrowing by imaging studies. This condition presents safety issues increasing the risk of falls.    There is no current active infection.  Patient Active Problem List   Diagnosis Date Noted  . Muscle pain 03/27/2016  . Mild cognitive impairment 12/28/2015  . Abnormal labor 11/01/2015  . Abnormality of gait 09/27/2015  . Chronic migraine 09/27/2015  . Lumbar back pain 11/24/2013  . Acquired scoliosis 07/03/2013  . Depression 06/10/2013  . Headache 06/10/2013  . Cervical dystonia 06/10/2013  . Choledocholithiasis with chronic cholecystitis 10/04/2012  . Cholelithiasis with acute or chronic cholecystitis 09/06/2012  . History of breast cancer in female 09/06/2012  . Hypertension 09/06/2012  . Coronary artery disease 09/06/2012   Past Medical History:  Diagnosis Date  . Abdominal pain   . Anginal pain [facility])    sees [name]; reportedly negative stress/echo 2012  . Arthritis   . Bronchitis   . Cancer [facility])    breast cancer  . Carpal tunnel syndrome    bilaterally  . Cataracts, bilateral    . Complication of anesthesia    "difficutly waking up and hypothermia and stopped breathing"  . Confusion   . Dementia    early stages per patient  . Dementia    "early stages per patient" and long term memory loss  . Depression   . Diverticulosis   . Esophageal reflux   . Gallstones   . Hypertension    sees [name]  . IBS (irritable bowel syndrome)   . Irritable bowel   . Migraines   . Nausea   . Osteoporosis   . PONV (postoperative nausea and vomiting)   . Refusal of blood transfusions as patient is Jehovah's Witness   . Scoliosis     Past Surgical History:  Procedure Laterality Date  . ANKLE SURGERY    . BREAST SURGERY     x 4  . CARDIOVASCULAR STRESS TEST    . CHOLECYSTECTOMY  10/04/2012   Procedure: LAPAROSCOPIC CHOLECYSTECTOMY WITH INTRAOPERATIVE CHOLANGIOGRAM;  Surgeon: Adin Hector, MD;  Location: [place];  Service: General;  Laterality: N/A;  laparosopic cholecystectomy with cholangiogram and repair of umbilical hernia  . COLONOSCOPY  08/30/11  . DILATION AND CURETTAGE OF UTERUS    . DOPPLER ECHOCARDIOGRAPHY     hx   . ERCP  10/05/2012   Procedure: ENDOSCOPIC RETROGRADE CHOLANGIOPANCREATOGRAPHY (ERCP);  Surgeon: Jeryl Columbia, MD;  Location: [facility] ENDOSCOPY;  Service: Endoscopy;  Laterality: N/A;  . ESOPHAGOGASTRODUODENOSCOPY  08/30/11  . JOINT REPLACEMENT     knee - right  . KELOID EXCISION     from bilateral earlobes  .  KNEE SURGERY    . LAPAROSCOPIC CHOLECYSTECTOMY W/ CHOLANGIOGRAPHY  10/04/2012  . SHOULDER SURGERY  06/2011  . SKIN GRAFT    . TUBAL LIGATION    . WRIST SURGERY  1995    No current facility-administered medications for this encounter.    Current Outpatient Medications  Medication Sig Dispense Refill Last Dose  . ALPRAZolam (XANAX) 0.25 MG tablet Take 0.25-0.5 mg by mouth See admin instructions. Take one tab in am, one tab in afternoon, and two tabs at bedtime.   Taking  . Aspirin 81 MG EC tablet Take 81 mg by mouth daily.    Taking  . Calcium Carbonate-Vit D-Min (CALCIUM 1200 PO) Take 1 tablet by mouth 2 (two) times daily.   Taking  . Cholecalciferol (VITAMIN D-3) 1000 units CAPS Take 1 tablet by mouth daily.    Taking  . diltiazem (CARDIZEM CD) 120 MG 24 hr capsule Take 120 mg by mouth daily.    Taking  . donepezil (ARICEPT) 10 MG tablet TAKE 1 TABLET AT BEDTIME 90 tablet 3   . furosemide (LASIX) 40 MG tablet Take 120 mg by mouth 2 (two) times daily.    Taking  . hypromellose (SYSTANE OVERNIGHT THERAPY) 0.3 % GEL ophthalmic ointment Place 1 application into both eyes at bedtime.     Marland Kitchen losartan (COZAAR) 50 MG tablet Take 50 mg by mouth daily.   Taking  . memantine (NAMENDA) 10 MG tablet Take 1 tablet (10 mg total) by mouth 2 (two) times daily. 180 tablet 3 Taking  . Menthol, Topical Analgesic, (BIOFREEZE) 4 % GEL Apply 1 application topically 3 (three) times daily as needed (body pain).     . Multiple Vitamins-Minerals (PRESERVISION AREDS PO) Take 1 capsule by mouth 2 (two) times daily.    Taking  . potassium chloride (K-DUR,KLOR-CON) 10 MEQ tablet Take 10 mEq by mouth daily.   Taking  . pregabalin (LYRICA) 300 MG capsule Take 300 mg by mouth 2 (two) times daily.    Taking  . Probiotic Product (PROBIOTIC PO) Take 1 capsule by mouth daily.    Taking  . propranolol (INDERAL) 60 MG tablet Take 1 tablet (60 mg total) by mouth 2 (two) times daily. 30 tablet 3 Taking  . Propylene Glycol (SYSTANE COMPLETE) 0.6 % SOLN Place 1 drop into both eyes as needed.     . sodium chloride (MURO 128) 2 % ophthalmic solution Place 1 drop into both eyes 4 (four) times daily.     Marland Kitchen gabapentin (NEURONTIN) 600 MG tablet Take 600 mg by mouth 3 (three) times daily.      Allergies  Allergen Reactions  . Plavix [Clopidogrel Bisulfate] Itching and Other (See Comments)    Causes migraines. Itching all over the body.  . Warfarin And Related Other (See Comments)    Blood thinners - cause migraines in patient.  . Adhesive [Tape] Other (See  Comments)    Severe irritation of skin    Social History   Tobacco Use  . Smoking status: Never Smoker  . Smokeless tobacco: Never Used  Substance Use Topics  . Alcohol use: No    Family History  Problem Relation Age of Onset  . Colon polyps Mother   . Breast cancer Sister   . Dementia Sister      Review of Systems  HENT: Positive for hearing loss and tinnitus.   Eyes: Positive for blurred vision.  Respiratory: Negative.   Cardiovascular: Positive for leg swelling.  HTN and heart atack  Gastrointestinal: Positive for constipation, diarrhea and heartburn.  Genitourinary: Negative.   Musculoskeletal: Positive for joint pain and myalgias.  Neurological: Positive for seizures and weakness.  Endo/Heme/Allergies: Negative.   Psychiatric/Behavioral: Positive for depression and memory loss.    Objective:  Physical Exam  Constitutional: She is oriented to person, place, and time. She appears well-developed and well-nourished.  HENT:  Head: Normocephalic and atraumatic.  Eyes: Pupils are equal, round, and reactive to light.  Neck: Normal range of motion. Neck supple.  Cardiovascular: Intact distal pulses.  Respiratory: Effort normal.  Musculoskeletal: She exhibits tenderness.  Inspection of both of her hips revealed no obvious deformity or muscle atrophy, no warmth, erythema, ecchymosis, or effusion, she is tender palpation and anterior hip on both sides, left being more tender to palpation versus the right side, she does have limitations in hip internal and external range of motion, specifically with internal range of motion only going to about 5 on the left, 10 on the right, strength is intact with hip flexion, knee flexion, knee extension on both sides, she is neurovascularly intact distally otherwise, she does have 2+ pitting edema extending up two thirds for lower legs on both sides  Neurological: She is alert and oriented to person, place, and time.  Skin: Skin is warm  and dry.  Psychiatric: She has a normal mood and affect. Her behavior is normal. Judgment and thought content normal.    Vital signs in last 24 hours:    Labs:   Estimated body mass index is 36.56 kg/m as calculated from the following:   Height as of 06/18/18: 5' 5.5" (1.664 m).   Weight as of 06/18/18: 101.2 kg (223 lb 1.6 oz).   Imaging Review Plain radiographs demonstrate Diminished joint space in bilateral hips.    Preoperative templating of the joint replacement has been completed, documented, and submitted to the Operating Room personnel in order to optimize intra-operative equipment management.     Assessment/Plan:  End stage arthritis, left hip(s)  The patient history, physical examination, clinical judgement of the provider and imaging studies are consistent with end stage degenerative joint disease of the left hip(s) and total hip arthroplasty is deemed medically necessary. The treatment options including medical management, injection therapy, arthroscopy and arthroplasty were discussed at length. The risks and benefits of total hip arthroplasty were presented and reviewed. The risks due to aseptic loosening, infection, stiffness, dislocation/subluxation,  thromboembolic complications and other imponderables were discussed.  The patient acknowledged the explanation, agreed to proceed with the plan and consent was signed. Patient is being admitted for inpatient treatment for surgery, pain control, PT, OT, prophylactic antibiotics, VTE prophylaxis, progressive ambulation and ADL's and discharge planning.The patient is planning to be discharged home with home health services.

## 2018-06-27 MED ORDER — CEFAZOLIN SODIUM-DEXTROSE 2-4 GM/100ML-% IV SOLN
2.0000 g | INTRAVENOUS | Status: AC
  Administered 2018-06-28: 2 g via INTRAVENOUS
  Filled 2018-06-27: qty 100

## 2018-06-27 MED ORDER — BUPIVACAINE LIPOSOME 1.3 % IJ SUSP
20.0000 mL | Freq: Once | INTRAMUSCULAR | Status: AC
  Administered 2018-06-28: 20 mL
  Filled 2018-06-27: qty 20

## 2018-06-27 MED ORDER — TRANEXAMIC ACID 1000 MG/10ML IV SOLN
2000.0000 mg | INTRAVENOUS | Status: AC
  Administered 2018-06-28: 2000 mg via TOPICAL
  Filled 2018-06-27: qty 20

## 2018-06-27 MED ORDER — TRANEXAMIC ACID 1000 MG/10ML IV SOLN
1000.0000 mg | INTRAVENOUS | Status: AC
  Administered 2018-06-28: 1000 mg via INTRAVENOUS
  Filled 2018-06-27: qty 1100

## 2018-06-27 MED ORDER — LACTATED RINGERS IV SOLN
INTRAVENOUS | Status: DC
  Administered 2018-06-28 (×2): via INTRAVENOUS

## 2018-06-27 NOTE — Anesthesia Preprocedure Evaluation (Addendum)
Anesthesia Evaluation  Patient identified by MRN, date of birth, ID band Patient awake    Reviewed: Allergy & Precautions, H&P , NPO status , Patient's Chart, lab work & pertinent test results, reviewed documented beta blocker date and time   History of Anesthesia Complications (+) PONV  Airway Mallampati: II  TM Distance: >3 FB     Dental  (+) Edentulous Upper, Dental Advisory Given, Missing, Poor Dentition,    Pulmonary    breath sounds clear to auscultation       Cardiovascular hypertension, Pt. on home beta blockers and Pt. on medications + CAD   Rhythm:Regular Rate:Normal  EKG: 06/18/2018: Sinus bradycardia. ST & T wave abnormality, consider inferior ischemia.   CV: Myocardial Perfusion Imaging 05/16/2018:  Nuclear stress EF: 45%.  The left ventricular ejection fraction is mildly decreased (45-54%).  There was no ST segment deviation noted during stress.  The study is normal.  This is a low risk study.  Low risk stress nuclear study with normal perfusion and mildly reduced left ventricular global systolic function. Compared to 2014, perfusion pattern has normalized, but LVEF has decreased. Possible gating artifact, suggest correlation with echo.  Echo 05/07/2018: Study Conclusions Left ventricle: The cavity size was normal. Wall thickness was increased in a pattern of mild LVH. Systolic function was normal. The estimated ejection fraction was in the range of 60% to 65%. Wall motion was normal; there were no regional wall motion abnormalities. Doppler parameters are consistent with abnormal left ventricular relaxation (grade 1 diastolic dysfunction). - Mitral valve: There was mild regurgitation.   Neuro/Psych  Headaches, PSYCHIATRIC DISORDERS Depression  Neuromuscular disease    GI/Hepatic Neg liver ROS, GERD  Medicated,  Endo/Other  negative endocrine ROS  Renal/GU negative Renal ROS  negative  genitourinary   Musculoskeletal  (+) Arthritis , Osteoarthritis,    Abdominal   Peds  Hematology negative hematology ROS (+)   Anesthesia Other Findings   Reproductive/Obstetrics negative OB ROS                            Anesthesia Physical  Anesthesia Plan  ASA: III  Anesthesia Plan: MAC   Post-op Pain Management:    Induction: Intravenous  PONV Risk Score and Plan: 2 and Ondansetron and Treatment may vary due to age or medical condition  Airway Management Planned: Nasal Cannula and Natural Airway  Additional Equipment:   Intra-op Plan:   Post-operative Plan: Extubation in OR  Informed Consent: I have reviewed the patients History and Physical, chart, labs and discussed the procedure including the risks, benefits and alternatives for the proposed anesthesia with the patient or authorized representative who has indicated his/her understanding and acceptance.     Plan Discussed with: CRNA, Surgeon and Anesthesiologist  Anesthesia Plan Comments: (Htn Symptomatic cholelithiasis GERD  )        Anesthesia Quick Evaluation

## 2018-06-28 ENCOUNTER — Encounter (HOSPITAL_COMMUNITY): Payer: Self-pay | Admitting: Urology

## 2018-06-28 ENCOUNTER — Inpatient Hospital Stay (HOSPITAL_COMMUNITY): Payer: Medicare Other | Admitting: Physician Assistant

## 2018-06-28 ENCOUNTER — Inpatient Hospital Stay (HOSPITAL_COMMUNITY): Payer: Medicare Other

## 2018-06-28 ENCOUNTER — Encounter (HOSPITAL_COMMUNITY)
Admission: RE | Disposition: A | Discharge status: Home Health | Payer: Self-pay | Source: Ambulatory Visit | Attending: Orthopedic Surgery

## 2018-06-28 ENCOUNTER — Other Ambulatory Visit: Payer: Self-pay

## 2018-06-28 ENCOUNTER — Inpatient Hospital Stay (HOSPITAL_COMMUNITY)
Admission: RE | Admit: 2018-06-28 | Discharge: 2018-07-02 | DRG: 470 | Disposition: A | Discharge status: Home Health | Payer: Medicare Other | Source: Ambulatory Visit | Attending: Orthopedic Surgery | Admitting: Orthopedic Surgery

## 2018-06-28 DIAGNOSIS — K219 Gastro-esophageal reflux disease without esophagitis: Secondary | ICD-10-CM | POA: Diagnosis present

## 2018-06-28 DIAGNOSIS — Z85820 Personal history of malignant melanoma of skin: Secondary | ICD-10-CM | POA: Diagnosis not present

## 2018-06-28 DIAGNOSIS — E039 Hypothyroidism, unspecified: Secondary | ICD-10-CM | POA: Diagnosis present

## 2018-06-28 DIAGNOSIS — Z853 Personal history of malignant neoplasm of breast: Secondary | ICD-10-CM | POA: Diagnosis not present

## 2018-06-28 DIAGNOSIS — Z96651 Presence of right artificial knee joint: Secondary | ICD-10-CM | POA: Diagnosis present

## 2018-06-28 DIAGNOSIS — Z7982 Long term (current) use of aspirin: Secondary | ICD-10-CM | POA: Diagnosis not present

## 2018-06-28 DIAGNOSIS — M1612 Unilateral primary osteoarthritis, left hip: Secondary | ICD-10-CM | POA: Diagnosis present

## 2018-06-28 DIAGNOSIS — Z79899 Other long term (current) drug therapy: Secondary | ICD-10-CM

## 2018-06-28 DIAGNOSIS — F329 Major depressive disorder, single episode, unspecified: Secondary | ICD-10-CM | POA: Diagnosis present

## 2018-06-28 DIAGNOSIS — Z91048 Other nonmedicinal substance allergy status: Secondary | ICD-10-CM

## 2018-06-28 DIAGNOSIS — Z888 Allergy status to other drugs, medicaments and biological substances status: Secondary | ICD-10-CM

## 2018-06-28 DIAGNOSIS — K589 Irritable bowel syndrome without diarrhea: Secondary | ICD-10-CM | POA: Diagnosis present

## 2018-06-28 DIAGNOSIS — Z9181 History of falling: Secondary | ICD-10-CM | POA: Diagnosis not present

## 2018-06-28 DIAGNOSIS — I2510 Atherosclerotic heart disease of native coronary artery without angina pectoris: Secondary | ICD-10-CM | POA: Diagnosis present

## 2018-06-28 DIAGNOSIS — M810 Age-related osteoporosis without current pathological fracture: Secondary | ICD-10-CM | POA: Diagnosis present

## 2018-06-28 DIAGNOSIS — D62 Acute posthemorrhagic anemia: Secondary | ICD-10-CM | POA: Diagnosis not present

## 2018-06-28 DIAGNOSIS — I252 Old myocardial infarction: Secondary | ICD-10-CM

## 2018-06-28 DIAGNOSIS — Z6838 Body mass index (BMI) 38.0-38.9, adult: Secondary | ICD-10-CM | POA: Diagnosis not present

## 2018-06-28 DIAGNOSIS — E669 Obesity, unspecified: Secondary | ICD-10-CM | POA: Diagnosis present

## 2018-06-28 DIAGNOSIS — F0390 Unspecified dementia without behavioral disturbance: Secondary | ICD-10-CM | POA: Diagnosis present

## 2018-06-28 DIAGNOSIS — Z419 Encounter for procedure for purposes other than remedying health state, unspecified: Secondary | ICD-10-CM

## 2018-06-28 DIAGNOSIS — M1611 Unilateral primary osteoarthritis, right hip: Secondary | ICD-10-CM | POA: Diagnosis present

## 2018-06-28 DIAGNOSIS — I10 Essential (primary) hypertension: Secondary | ICD-10-CM | POA: Diagnosis present

## 2018-06-28 HISTORY — DX: Hypothyroidism, unspecified: E03.9

## 2018-06-28 HISTORY — DX: Malignant neoplasm of unspecified site of left female breast: C50.912

## 2018-06-28 HISTORY — DX: Malignant melanoma of skin, unspecified: C43.9

## 2018-06-28 HISTORY — PX: TOTAL HIP ARTHROPLASTY: SHX124

## 2018-06-28 HISTORY — DX: Low back pain, unspecified: M54.50

## 2018-06-28 HISTORY — DX: Acute myocardial infarction, unspecified: I21.9

## 2018-06-28 HISTORY — DX: Low back pain: M54.5

## 2018-06-28 HISTORY — DX: Other chronic pain: G89.29

## 2018-06-28 SURGERY — ARTHROPLASTY, HIP, TOTAL, ANTERIOR APPROACH
Anesthesia: Monitor Anesthesia Care | Site: Hip | Laterality: Right

## 2018-06-28 MED ORDER — BUPIVACAINE-EPINEPHRINE 0.5% -1:200000 IJ SOLN
INTRAMUSCULAR | Status: AC
  Filled 2018-06-28: qty 1

## 2018-06-28 MED ORDER — DOCUSATE SODIUM 100 MG PO CAPS
100.0000 mg | ORAL_CAPSULE | Freq: Two times a day (BID) | ORAL | Status: DC
  Administered 2018-06-28 – 2018-07-02 (×8): 100 mg via ORAL
  Filled 2018-06-28 (×8): qty 1

## 2018-06-28 MED ORDER — DONEPEZIL HCL 10 MG PO TABS
10.0000 mg | ORAL_TABLET | Freq: Every day | ORAL | Status: DC
  Administered 2018-06-29 – 2018-07-01 (×3): 10 mg via ORAL
  Filled 2018-06-28 (×4): qty 1

## 2018-06-28 MED ORDER — OXYCODONE HCL 5 MG PO TABS
5.0000 mg | ORAL_TABLET | ORAL | Status: DC | PRN
  Administered 2018-06-28 – 2018-06-30 (×8): 10 mg via ORAL
  Administered 2018-07-01: 5 mg via ORAL
  Administered 2018-07-01: 10 mg via ORAL
  Filled 2018-06-28 (×7): qty 2
  Filled 2018-06-28: qty 1
  Filled 2018-06-28 (×2): qty 2

## 2018-06-28 MED ORDER — GABAPENTIN 600 MG PO TABS
600.0000 mg | ORAL_TABLET | Freq: Three times a day (TID) | ORAL | Status: DC
  Administered 2018-06-28 – 2018-07-02 (×13): 600 mg via ORAL
  Filled 2018-06-28 (×13): qty 1

## 2018-06-28 MED ORDER — GLYCOPYRROLATE 0.2 MG/ML IJ SOLN
INTRAMUSCULAR | Status: AC
  Administered 2018-06-28: 0.2 mg via INTRAVENOUS
  Filled 2018-06-28: qty 1

## 2018-06-28 MED ORDER — METHOCARBAMOL 500 MG PO TABS
500.0000 mg | ORAL_TABLET | Freq: Four times a day (QID) | ORAL | Status: DC | PRN
  Administered 2018-06-30 – 2018-07-01 (×2): 500 mg via ORAL
  Filled 2018-06-28 (×2): qty 1

## 2018-06-28 MED ORDER — PROPOFOL 500 MG/50ML IV EMUL
INTRAVENOUS | Status: DC | PRN
  Administered 2018-06-28: 50 ug/kg/min via INTRAVENOUS

## 2018-06-28 MED ORDER — MEPERIDINE HCL 50 MG/ML IJ SOLN: 6.2500 mg | INTRAMUSCULAR | Status: DC | PRN

## 2018-06-28 MED ORDER — LOSARTAN POTASSIUM 50 MG PO TABS
50.0000 mg | ORAL_TABLET | Freq: Every day | ORAL | Status: DC
  Administered 2018-06-29 – 2018-07-02 (×4): 50 mg via ORAL
  Filled 2018-06-28 (×4): qty 1

## 2018-06-28 MED ORDER — BUPIVACAINE-EPINEPHRINE (PF) 0.5% -1:200000 IJ SOLN
INTRAMUSCULAR | Status: DC | PRN
  Administered 2018-06-28: 50 mL

## 2018-06-28 MED ORDER — ONDANSETRON HCL 4 MG PO TABS: 4.0000 mg | ORAL_TABLET | Freq: Four times a day (QID) | ORAL | Status: DC | PRN

## 2018-06-28 MED ORDER — CHLORHEXIDINE GLUCONATE 4 % EX LIQD: 60.0000 mL | Freq: Once | CUTANEOUS | Status: DC

## 2018-06-28 MED ORDER — POTASSIUM CHLORIDE CRYS ER 10 MEQ PO TBCR
10.0000 meq | EXTENDED_RELEASE_TABLET | Freq: Every day | ORAL | Status: DC
  Administered 2018-06-29 – 2018-07-02 (×4): 10 meq via ORAL
  Filled 2018-06-28 (×4): qty 1

## 2018-06-28 MED ORDER — FENTANYL CITRATE (PF) 100 MCG/2ML IJ SOLN
INTRAMUSCULAR | Status: AC
  Administered 2018-06-28: 50 ug via INTRAVENOUS
  Filled 2018-06-28: qty 2

## 2018-06-28 MED ORDER — METHOCARBAMOL 1000 MG/10ML IJ SOLN
500.0000 mg | Freq: Four times a day (QID) | INTRAVENOUS | Status: DC | PRN
  Administered 2018-06-28: 500 mg via INTRAVENOUS
  Filled 2018-06-28: qty 5

## 2018-06-28 MED ORDER — ACETAMINOPHEN 325 MG PO TABS: 325.0000 mg | ORAL_TABLET | Freq: Four times a day (QID) | ORAL | Status: DC | PRN

## 2018-06-28 MED ORDER — PROPYLENE GLYCOL 0.6 % OP SOLN
1.0000 [drp] | OPHTHALMIC | Status: DC | PRN
  Filled 2018-06-28: qty 1

## 2018-06-28 MED ORDER — GLYCOPYRROLATE 0.2 MG/ML IJ SOLN
0.2000 mg | Freq: Once | INTRAMUSCULAR | Status: AC
  Administered 2018-06-28: 0.2 mg via INTRAVENOUS

## 2018-06-28 MED ORDER — ONDANSETRON HCL 4 MG/2ML IJ SOLN: 4.0000 mg | Freq: Four times a day (QID) | INTRAMUSCULAR | Status: DC | PRN

## 2018-06-28 MED ORDER — DIPHENHYDRAMINE HCL 12.5 MG/5ML PO ELIX: 12.5000 mg | ORAL_SOLUTION | ORAL | Status: DC | PRN

## 2018-06-28 MED ORDER — SODIUM CHLORIDE (HYPERTONIC) 2 % OP SOLN
1.0000 [drp] | Freq: Four times a day (QID) | OPHTHALMIC | Status: DC
  Administered 2018-06-28 – 2018-07-02 (×14): 1 [drp] via OPHTHALMIC
  Filled 2018-06-28: qty 15

## 2018-06-28 MED ORDER — ONDANSETRON HCL 4 MG/2ML IJ SOLN
INTRAMUSCULAR | Status: DC | PRN
  Administered 2018-06-28: 4 mg via INTRAVENOUS

## 2018-06-28 MED ORDER — FENTANYL CITRATE (PF) 100 MCG/2ML IJ SOLN
25.0000 ug | INTRAMUSCULAR | Status: DC | PRN
  Administered 2018-06-28 (×3): 50 ug via INTRAVENOUS

## 2018-06-28 MED ORDER — METOCLOPRAMIDE HCL 5 MG/ML IJ SOLN: 5.0000 mg | Freq: Three times a day (TID) | INTRAMUSCULAR | Status: DC | PRN

## 2018-06-28 MED ORDER — GLYCOPYRROLATE 0.2 MG/ML IJ SOLN: 0.1000 mg | Freq: Once | INTRAMUSCULAR | Status: DC

## 2018-06-28 MED ORDER — HYPROMELLOSE 0.3 % OP GEL
1.0000 "application " | Freq: Every day | OPHTHALMIC | Status: DC
  Administered 2018-06-29 – 2018-07-01 (×2): 1 via OPHTHALMIC
  Filled 2018-06-28: qty 3.5

## 2018-06-28 MED ORDER — GLYCOPYRROLATE 0.2 MG/ML IJ SOLN
INTRAMUSCULAR | Status: DC | PRN
  Administered 2018-06-28: 0.2 mg via INTRAVENOUS

## 2018-06-28 MED ORDER — TIZANIDINE HCL 2 MG PO TABS: 2.0000 mg | ORAL_TABLET | Freq: Four times a day (QID) | ORAL | 0 refills | Status: DC | PRN

## 2018-06-28 MED ORDER — EPHEDRINE 5 MG/ML INJ
INTRAVENOUS | Status: AC
  Filled 2018-06-28: qty 10

## 2018-06-28 MED ORDER — ALUM & MAG HYDROXIDE-SIMETH 200-200-20 MG/5ML PO SUSP: 30.0000 mL | ORAL | Status: DC | PRN

## 2018-06-28 MED ORDER — FUROSEMIDE 80 MG PO TABS
120.0000 mg | ORAL_TABLET | Freq: Two times a day (BID) | ORAL | Status: DC
  Administered 2018-06-29 – 2018-07-02 (×7): 120 mg via ORAL
  Filled 2018-06-28 (×10): qty 1

## 2018-06-28 MED ORDER — SODIUM CHLORIDE 0.9 % IV SOLN
INTRAVENOUS | Status: DC | PRN
  Administered 2018-06-28: 40 ug/min via INTRAVENOUS

## 2018-06-28 MED ORDER — TRANEXAMIC ACID 1000 MG/10ML IV SOLN
1000.0000 mg | Freq: Once | INTRAVENOUS | Status: DC
  Filled 2018-06-28: qty 10

## 2018-06-28 MED ORDER — PHENOL 1.4 % MT LIQD: 1.0000 | OROMUCOSAL | Status: DC | PRN

## 2018-06-28 MED ORDER — CELECOXIB 200 MG PO CAPS
200.0000 mg | ORAL_CAPSULE | Freq: Two times a day (BID) | ORAL | Status: DC
  Administered 2018-06-28 – 2018-07-02 (×9): 200 mg via ORAL
  Filled 2018-06-28 (×9): qty 1

## 2018-06-28 MED ORDER — PROPOFOL 10 MG/ML IV BOLUS
INTRAVENOUS | Status: DC | PRN
  Administered 2018-06-28: 20 mg via INTRAVENOUS

## 2018-06-28 MED ORDER — METOCLOPRAMIDE HCL 5 MG PO TABS: 5.0000 mg | ORAL_TABLET | Freq: Three times a day (TID) | ORAL | Status: DC | PRN

## 2018-06-28 MED ORDER — ALPRAZOLAM 0.25 MG PO TABS
0.2500 mg | ORAL_TABLET | Freq: Two times a day (BID) | ORAL | Status: DC
  Administered 2018-06-29 – 2018-07-02 (×8): 0.25 mg via ORAL
  Filled 2018-06-28 (×7): qty 1

## 2018-06-28 MED ORDER — ACETAMINOPHEN 500 MG PO TABS
1000.0000 mg | ORAL_TABLET | Freq: Four times a day (QID) | ORAL | Status: AC
  Administered 2018-06-28 (×3): 1000 mg via ORAL
  Filled 2018-06-28 (×3): qty 2

## 2018-06-28 MED ORDER — ONDANSETRON HCL 4 MG/2ML IJ SOLN
INTRAMUSCULAR | Status: AC
  Filled 2018-06-28: qty 2

## 2018-06-28 MED ORDER — HYDROMORPHONE HCL 1 MG/ML IJ SOLN
0.5000 mg | INTRAMUSCULAR | Status: DC | PRN
  Administered 2018-06-28 – 2018-06-30 (×2): 1 mg via INTRAVENOUS
  Filled 2018-06-28 (×2): qty 1

## 2018-06-28 MED ORDER — BISACODYL 5 MG PO TBEC
5.0000 mg | DELAYED_RELEASE_TABLET | Freq: Every day | ORAL | Status: DC | PRN
  Administered 2018-06-29: 5 mg via ORAL
  Filled 2018-06-28: qty 1

## 2018-06-28 MED ORDER — KCL IN DEXTROSE-NACL 20-5-0.45 MEQ/L-%-% IV SOLN
INTRAVENOUS | Status: DC
  Administered 2018-06-28: 13:00:00 via INTRAVENOUS
  Filled 2018-06-28 (×2): qty 1000

## 2018-06-28 MED ORDER — PROPRANOLOL HCL 60 MG PO TABS
60.0000 mg | ORAL_TABLET | Freq: Two times a day (BID) | ORAL | Status: DC
  Administered 2018-06-29 – 2018-07-02 (×7): 60 mg via ORAL
  Filled 2018-06-28: qty 3
  Filled 2018-06-28: qty 1
  Filled 2018-06-28: qty 3
  Filled 2018-06-28 (×2): qty 1
  Filled 2018-06-28 (×2): qty 3
  Filled 2018-06-28 (×2): qty 1
  Filled 2018-06-28: qty 3
  Filled 2018-06-28: qty 1
  Filled 2018-06-28: qty 3
  Filled 2018-06-28 (×2): qty 1
  Filled 2018-06-28: qty 3
  Filled 2018-06-28: qty 1
  Filled 2018-06-28: qty 3

## 2018-06-28 MED ORDER — FLEET ENEMA 7-19 GM/118ML RE ENEM: 1.0000 | ENEMA | Freq: Once | RECTAL | Status: DC | PRN

## 2018-06-28 MED ORDER — MEMANTINE HCL 10 MG PO TABS
10.0000 mg | ORAL_TABLET | Freq: Two times a day (BID) | ORAL | Status: DC
  Administered 2018-06-29 – 2018-07-02 (×7): 10 mg via ORAL
  Filled 2018-06-28 (×8): qty 1

## 2018-06-28 MED ORDER — ALPRAZOLAM 0.5 MG PO TABS
0.5000 mg | ORAL_TABLET | Freq: Every day | ORAL | Status: DC
  Administered 2018-06-28 – 2018-07-01 (×4): 0.5 mg via ORAL
  Filled 2018-06-28 (×4): qty 1

## 2018-06-28 MED ORDER — BUPIVACAINE IN DEXTROSE 0.75-8.25 % IT SOLN
INTRATHECAL | Status: DC | PRN
  Administered 2018-06-28: 1.8 mL via INTRATHECAL

## 2018-06-28 MED ORDER — PANTOPRAZOLE SODIUM 40 MG PO TBEC
40.0000 mg | DELAYED_RELEASE_TABLET | Freq: Every day | ORAL | Status: DC
  Administered 2018-06-28 – 2018-07-02 (×5): 40 mg via ORAL
  Filled 2018-06-28 (×5): qty 1

## 2018-06-28 MED ORDER — 0.9 % SODIUM CHLORIDE (POUR BTL) OPTIME
TOPICAL | Status: DC | PRN
  Administered 2018-06-28: 1000 mL

## 2018-06-28 MED ORDER — HYPROMELLOSE (GONIOSCOPIC) 2.5 % OP SOLN
1.0000 [drp] | OPHTHALMIC | Status: DC | PRN
Start: 2018-06-28 — End: 2018-07-02
  Administered 2018-06-28: 1 [drp] via OPHTHALMIC

## 2018-06-28 MED ORDER — EPHEDRINE SULFATE-NACL 50-0.9 MG/10ML-% IV SOSY
PREFILLED_SYRINGE | INTRAVENOUS | Status: DC | PRN
  Administered 2018-06-28: 5 mg via INTRAVENOUS
  Administered 2018-06-28 (×2): 10 mg via INTRAVENOUS

## 2018-06-28 MED ORDER — ASPIRIN EC 81 MG PO TBEC: 81.0000 mg | DELAYED_RELEASE_TABLET | Freq: Two times a day (BID) | ORAL | 0 refills | Status: AC

## 2018-06-28 MED ORDER — DEXAMETHASONE SODIUM PHOSPHATE 10 MG/ML IJ SOLN
10.0000 mg | Freq: Once | INTRAMUSCULAR | Status: AC
  Administered 2018-06-29: 10 mg via INTRAVENOUS
  Filled 2018-06-28: qty 1

## 2018-06-28 MED ORDER — OXYCODONE-ACETAMINOPHEN 5-325 MG PO TABS: 1.0000 | ORAL_TABLET | ORAL | 0 refills | Status: DC | PRN

## 2018-06-28 MED ORDER — PROPOFOL 1000 MG/100ML IV EMUL
INTRAVENOUS | Status: AC
  Filled 2018-06-28: qty 100

## 2018-06-28 MED ORDER — MENTHOL 3 MG MT LOZG: 1.0000 | LOZENGE | OROMUCOSAL | Status: DC | PRN

## 2018-06-28 MED ORDER — SODIUM CHLORIDE 0.9 % IV SOLN: 30.0000 ug/min | INTRAVENOUS | Status: DC

## 2018-06-28 MED ORDER — DILTIAZEM HCL ER COATED BEADS 120 MG PO CP24
120.0000 mg | ORAL_CAPSULE | Freq: Every day | ORAL | Status: DC
  Administered 2018-06-30 – 2018-07-02 (×3): 120 mg via ORAL
  Filled 2018-06-28 (×4): qty 1

## 2018-06-28 MED ORDER — FENTANYL CITRATE (PF) 100 MCG/2ML IJ SOLN
INTRAMUSCULAR | Status: AC
  Filled 2018-06-28: qty 2

## 2018-06-28 MED ORDER — POLYETHYLENE GLYCOL 3350 17 G PO PACK: 17.0000 g | PACK | Freq: Every day | ORAL | Status: DC | PRN

## 2018-06-28 MED ORDER — ASPIRIN 81 MG PO CHEW
81.0000 mg | CHEWABLE_TABLET | Freq: Two times a day (BID) | ORAL | Status: DC
  Administered 2018-06-28 – 2018-07-02 (×8): 81 mg via ORAL
  Filled 2018-06-28 (×8): qty 1

## 2018-06-28 MED ORDER — GLYCOPYRROLATE PF 0.2 MG/ML IJ SOSY
PREFILLED_SYRINGE | INTRAMUSCULAR | Status: AC
  Filled 2018-06-28: qty 1

## 2018-06-28 SURGICAL SUPPLY — 51 items
BAG DECANTER FOR FLEXI CONT (MISCELLANEOUS) ×2 IMPLANT
BLADE SAW SGTL 18X1.27X75 (BLADE) ×2 IMPLANT
COVER PERINEAL POST (MISCELLANEOUS) ×2 IMPLANT
COVER SURGICAL LIGHT HANDLE (MISCELLANEOUS) ×2 IMPLANT
CUP ACETBLR 48 OD 100 SERIES (Hips) ×1 IMPLANT
DRAPE C-ARM 42X72 X-RAY (DRAPES) ×2 IMPLANT
DRAPE STERI IOBAN 125X83 (DRAPES) ×2 IMPLANT
DRAPE U-SHAPE 47X51 STRL (DRAPES) ×4 IMPLANT
DRSG AQUACEL AG ADV 3.5X10 (GAUZE/BANDAGES/DRESSINGS) ×2 IMPLANT
DURAPREP 26ML APPLICATOR (WOUND CARE) ×2 IMPLANT
ELECT BLADE 4.0 EZ CLEAN MEGAD (MISCELLANEOUS) ×2
ELECT REM PT RETURN 9FT ADLT (ELECTROSURGICAL) ×2
ELECTRODE BLDE 4.0 EZ CLN MEGD (MISCELLANEOUS) ×1 IMPLANT
ELECTRODE REM PT RTRN 9FT ADLT (ELECTROSURGICAL) ×1 IMPLANT
ELIMINATOR HOLE APEX DEPUY (Hips) ×1 IMPLANT
FACESHIELD WRAPAROUND (MASK) ×4 IMPLANT
FACESHIELD WRAPAROUND OR TEAM (MASK) ×2 IMPLANT
GLOVE BIO SURGEON STRL SZ7.5 (GLOVE) ×2 IMPLANT
GLOVE BIO SURGEON STRL SZ8.5 (GLOVE) ×2 IMPLANT
GLOVE BIOGEL PI IND STRL 8 (GLOVE) ×1 IMPLANT
GLOVE BIOGEL PI IND STRL 9 (GLOVE) ×1 IMPLANT
GLOVE BIOGEL PI INDICATOR 8 (GLOVE) ×1
GLOVE BIOGEL PI INDICATOR 9 (GLOVE) ×1
GLOVE SURG SS PI 6.5 STRL IVOR (GLOVE) ×3 IMPLANT
GOWN STRL REUS W/ TWL LRG LVL3 (GOWN DISPOSABLE) ×1 IMPLANT
GOWN STRL REUS W/ TWL XL LVL3 (GOWN DISPOSABLE) ×2 IMPLANT
GOWN STRL REUS W/TWL LRG LVL3 (GOWN DISPOSABLE) ×2
GOWN STRL REUS W/TWL XL LVL3 (GOWN DISPOSABLE) ×4
HEAD FEM STD 32X+1 STRL (Hips) ×1 IMPLANT
KIT BASIN OR (CUSTOM PROCEDURE TRAY) ×2 IMPLANT
KIT TURNOVER KIT B (KITS) ×2 IMPLANT
MANIFOLD NEPTUNE II (INSTRUMENTS) ×2 IMPLANT
NEEDLE HYPO 22GX1.5 SAFETY (NEEDLE) ×4 IMPLANT
NS IRRIG 1000ML POUR BTL (IV SOLUTION) ×2 IMPLANT
PACK TOTAL JOINT (CUSTOM PROCEDURE TRAY) ×2 IMPLANT
PAD ARMBOARD 7.5X6 YLW CONV (MISCELLANEOUS) ×4 IMPLANT
PINN ALTRX NEUT ID X OD 32X48 ×1 IMPLANT
STEM TRI LOC BPS GRIPTON SZ 2 IMPLANT
SUT ETHIBOND NAB CT1 #1 30IN (SUTURE) ×2 IMPLANT
SUT VIC AB 0 CT1 27 (SUTURE)
SUT VIC AB 0 CT1 27XBRD ANBCTR (SUTURE) IMPLANT
SUT VIC AB 1 CTX 36 (SUTURE) ×2
SUT VIC AB 1 CTX36XBRD ANBCTR (SUTURE) ×1 IMPLANT
SUT VIC AB 2-0 CT1 27 (SUTURE) ×2
SUT VIC AB 2-0 CT1 TAPERPNT 27 (SUTURE) ×1 IMPLANT
SUT VIC AB 3-0 CT1 27 (SUTURE) ×2
SUT VIC AB 3-0 CT1 TAPERPNT 27 (SUTURE) ×1 IMPLANT
SYR CONTROL 10ML LL (SYRINGE) ×4 IMPLANT
TOWEL OR 17X26 10 PK STRL BLUE (TOWEL DISPOSABLE) ×2 IMPLANT
TRAY CATH 16FR W/PLASTIC CATH (SET/KITS/TRAYS/PACK) IMPLANT
TRI LOC BPS W GRIPTON SZ 2 ×2 IMPLANT

## 2018-06-28 NOTE — Op Note (Signed)
OPERATIVE REPORT    DATE OF PROCEDURE:  06/28/2018       PREOPERATIVE DIAGNOSIS:  RIGHT HIP OSTEOARTHRITIS                                                          POSTOPERATIVE DIAGNOSIS:  RIGHT HIP OSTEOARTHRITIS                                                           PROCEDURE: Anterior R total hip arthroplasty using a 48 mm DePuy Pinnacle  Cup, [employer], 0-degree polyethylene liner, a +1.5x32 mm metal head, a Freight forwarder stem   SURGEON: [name]    ASSISTANT:   Kerry Hough. [employer]  (present throughout entire procedure and necessary for timely completion of the procedure)   ANESTHESIA: Spinal BLOOD LOSS: 300cc FLUID REPLACEMENT: 1600cc crystalloid Antibiotic: 2gm ancef Tranexamic Acid: 1gm IV, 2gm Topical COMPLICATIONS: none    INDICATIONS FOR PROCEDURE: A 77 y.o. year-old With  RIGHT HIP OSTEOARTHRITIS   for 5 years, x-rays show bone-on-bone arthritic changes, and osteophytes. Despite conservative measures with observation, anti-inflammatory medicine, narcotics, use of a cane, has severe unremitting pain and can ambulate only a few blocks before resting. Patient desires elective R total hip arthroplasty to decrease pain and increase function. The risks, benefits, and alternatives were discussed at length including but not limited to the risks of infection, bleeding, nerve injury, stiffness, blood clots, the need for revision surgery, cardiopulmonary complications, among others, and they were willing to proceed. Questions answered     PROCEDURE IN DETAIL: The patient was identified by armband,  received preoperative IV antibiotics in the holding area at [facility], taken to the operating room , appropriate anesthetic monitors  were attached and  anesthesia was induced with the patienton the gurney. The HANA boots were applied to the feet and he was then transferred to the HANA table with a peroneal post and support underneath the non-operative  le, which was locked in 5 lb traction. Theoperative lower extremity was then prepped and draped in the usual sterile fashion from just above the iliac crest to the knee. And a timeout procedure was performed. We then made a 12 cm incision along the interval at the leading edge of the tensor fascia lata of starting at 2 cm lateral to and 2 cm distal to the ASIS. Small bleeders in the skin and subcutaneous tissue identified and cauterized we dissected down to the fascia and made an incision in the fascia allowing [place] to elevate the fascia of the tensor muscle and exploited the interval between the rectus and the tensor fascia lata. A Hohmann retractor was then placed along the superior neck of the femur and a Cobra retractor along the inferior neck of the femur we teed the capsule starting out at the superior anterior aspect of the acetabulum going distally and made the T along the neck both leaflets of the T were tagged with #2 Ethibond suture. Cobra retractors were then placed along the inferior and superior neck allowing [place] to perform a standard neck cut and removed  the femoral head with a power corkscrew. We then placed a right angle Hohmann retractor along the anterior aspect of the acetabulum a spiked Cobra in the cotyloid notch and posteriorly a Muelller retractor. We then sequentially reamed up to a 47 mm basket reamer obtaining good coverage in all quadrants, verified by C-arm imaging. Under C-arm control with and hammered into place a 48 mm Pinnacle cup in 45 of abduction and 15 of anteversion. The cup seated nicely and required no supplemental screws. We then placed a central hole Eliminator and a 0 polyethylene liner. The foot was then externally rotated to 110, the HANA elevator was placed around the flare of the greater trochanter and the limb was extended and abducted delivering the proximal femur up into the wound. A medium Hohmann retractor was placed over the greater trochanter and a Mueller  retractor along the posterior femoral neck completing the exposure. We then performed releases superiorly and and inferiorly of the capsule going back to the pirformis fossa superiorly and to the lesser trochanter inferiorly. We then entered the proximal femur with the box cutting offset chisel followed by, a canal sounder, the chili pepper and broaching up to a 2 broach. This seated nicely and we reamed the calcar. A trial reduction was performed with a 1.5 mm 32 mm head.The limb lengths were excellent the hip was stable in 90 of external rotation. At this point the trial components removed and we hammered into place a # 2hi  Offset Tri-Lock stem with Gryption coating. A + 1.5x32 mm metal ball was then hammered into place the hip was reduced and final C-arm images obtained. The wound was thoroughly irrigated with normal saline solution. We repaired the ant capsule and the tensor fascia lot a with running 0 vicryl suture. the subcutaneous tissue was closed with 2-0 and 3-0 Vicryl suture followed by an Aquacil dressing. At this point the patient was awaken and transferred to hospital gurney without difficulty. The subcutaneous tissue with 0 and 2-0 undyed Vicryl suture and the skin with running  3-0 vicryl subcuticular suture. Aquacil dressing was applied. The patient was then unclamped, rolled supine, awaken extubated and taken to recovery room without difficulty in stable condition.   [name] 06/28/2018, 8:50 AM

## 2018-06-28 NOTE — Interval H&P Note (Signed)
History and Physical Interval Note:  06/28/2018 7:06 AM  Ashley Hanson  has presented today for surgery, with the diagnosis of RIGHT HIP OSTEOARTHRITIS  The various methods of treatment have been discussed with the patient and family. After consideration of risks, benefits and other options for treatment, the patient has consented to  Procedure(s): RIGHT TOTAL HIP ARTHROPLASTY ANTERIOR APPROACH (Right) as a surgical intervention .  The patient's history has been reviewed, patient examined, no change in status, stable for surgery.  I have reviewed the patient's chart and labs.  Questions were answered to the patient's satisfaction.     [name]

## 2018-06-28 NOTE — Care Plan (Signed)
Spoke with patient prior to surgery. SHe will discharge to home with the following:   DME; state that she has at home  HHPT: [facility] visits  MD follow up: Dr. Mayer Camel 07/11/18 @ 1000.   At this time she will not need OPPT.   Please contact Ladell Heads, [place] with questions or if this plan should need to change   Thanks

## 2018-06-28 NOTE — Transfer of Care (Signed)
Immediate Anesthesia Transfer of Care Note  Patient: Ashley Hanson  Procedure(s) Performed: Procedure(s): RIGHT TOTAL HIP ARTHROPLASTY ANTERIOR APPROACH (Right)  Patient Location: PACU  Anesthesia Type:Spinal  Level of Consciousness:  sedated, patient cooperative and responds to stimulation  Airway & Oxygen Therapy:Patient Spontanous Breathing and Patient connected to face mask oxgen  Post-op Assessment:  Report given to PACU RN and Post -op Vital signs reviewed and stable  Post vital signs:  Reviewed and stable  Last Vitals:  Vitals:   06/28/18 0543  BP: (!) 123/54  Pulse: (!) 48  Resp: 20  Temp: 37 C  SpO2: 48%    Complications: No apparent anesthesia complications

## 2018-06-28 NOTE — Care Management Note (Signed)
Case Management Note  Patient Details  Name: Ashley Hanson MRN: [id number] Date of Birth: 1941-01-21  Subjective/Objective:   Pt s/p RT THA on 06/28/18.  PTA, pt independent, lives at home with daughter and granddaughter.                  Action/Plan: CM consult for HHPT.   [place] list for [facility] given to pt's daughter for review; she prefers do some checking prior to selecting agency.  PT seeing pt now; await recommendations for DME.   Expected Discharge Date:                  Expected Discharge Plan:  [place]  In-House Referral:     Discharge planning Services  CM Consult  Post Acute Care Choice:  Home Health Choice offered to:  Patient, Adult Children  DME Arranged:    DME Agency:     HH Arranged:    [place] Agency:     Status of Service:  In process, will continue to follow  If discussed at Long Length of Stay Meetings, dates discussed:    Additional Comments:  Reinaldo Raddle, RN, BSN  Trauma/Neuro ICU Case Manager [phone]

## 2018-06-28 NOTE — Evaluation (Signed)
Physical Therapy Evaluation Patient Details Name: Ashley Hanson MRN: [id number] DOB: [birth year] Today's Date: 06/28/2018   History of Present Illness  Ashley Hanson is a 77 y.o. F s/p R THR Direct Anterior. PMH includes Migraines, LBP, Scoliosis, Dementia, Breast Cx, HTN, CAD, Depression, IBS, Diverticulosis, GERD, OA, RA.  Clinical Impression  Pt presents with problems above and deficits below. Pt and daughter were educated on supine HEP.Pt performed bed mobility with MinA. Pt performed sit<>stand and stand/pivot transfers with Min-ModA and RW. Pt had unsteadiness, buckling, and increased pain on R LE during mobility; further mobility deferred. Will continue to follow acutely to support independence, mobility, and safety.     Follow Up Recommendations Follow surgeon's recommendation for DC plan and follow-up therapies;Supervision/Assistance - 24 hour    Equipment Recommendations  Rolling walker with 5" wheels;3in1 (PT)    Recommendations for Other Services OT consult     Precautions / Restrictions Precautions Precautions: Fall Precaution Comments: Pt educated on supine HEP Restrictions Weight Bearing Restrictions: Yes RLE Weight Bearing: Weight bearing as tolerated Other Position/Activity Restrictions: Marland Kitchen      Mobility  Bed Mobility Overal bed mobility: Needs Assistance Bed Mobility: Supine to Sit     Supine to sit: Min assist     General bed mobility comments: Pt required MinA to move R LE across the bed, and MinA for trunk elevation  Transfers Overall transfer level: Needs assistance Equipment used: Rolling walker (2 wheeled) Transfers: Sit to/from [facility] Sit to Stand: Mod assist;Min assist Stand pivot transfers: Min assist       General transfer comment: Pt required Min-ModA for sit<>stand transfers. ModA for initial power up, and MinA for steadying once standing. Pt required MinA and cues for stand/pivot transfers. Pt had a flexed posture during  transfers, and reported poor posture is baseline. Pt demonstrated good sequencing, hand placement, and safe use of RW. PT noted some buckling of R LE during weight shift. Overall, patient was unsteady during transfers and limited due to pain. Pt performed 2x stand/pivots to [facility] and then to recliner.   Ambulation/Gait             General Gait Details: Deferred due to unsteadiness and pain  Stairs            Wheelchair Mobility    Modified Rankin (Stroke Patients Only)       Balance Overall balance assessment: Needs assistance Sitting-balance support: Bilateral upper extremity supported;Feet supported Sitting balance-Leahy Scale: Fair Sitting balance - Comments: Pt was able to sit at EOB while PT put on gait belt.    Standing balance support: Bilateral upper extremity supported Standing balance-Leahy Scale: Poor Standing balance comment: Pt had flexed posture and reliant on UE support during static standing.                              Pertinent Vitals/Pain Pain Assessment: 0-10 Pain Score: 8  Pain Location: R Hip Pain Descriptors / Indicators: Grimacing;Guarding Pain Intervention(s): Limited activity within patient's tolerance;Monitored during session;Patient requesting pain meds-RN notified;Repositioned    Home Living Family/patient expects to be discharged to:: Private residence Living Arrangements: Children(Daughter) Available Help at Discharge: Family;Available PRN/intermittently Type of Home: Apartment Home Access: Level entry     Home Layout: One level Home Equipment: Grab bars - tub/shower;Shower seat;Cane - single point      Prior Function Level of Independence: Needs assistance   Gait / Transfers Assistance Needed: Pt reported  walking with two canes, and had difficulty walking further than 50 ft.   ADL's / Homemaking Assistance Needed: Pt required some help with bathing        Hand Dominance   Dominant Hand: Right     Extremity/Trunk Assessment   Upper Extremity Assessment Upper Extremity Assessment: Defer to OT evaluation    Lower Extremity Assessment Lower Extremity Assessment: RLE deficits/detail RLE Deficits / Details: Pain and deficits as expected s/p R THR. Pt also has previous R TKR.  RLE Sensation: history of peripheral neuropathy    Cervical / Trunk Assessment Cervical / Trunk Assessment: Kyphotic;Other exceptions(Scoliosis at baseline) Cervical / Trunk Exceptions: Pt reports having scoliosis at baseline  Communication   Communication: No difficulties  Cognition Arousal/Alertness: Awake/alert Behavior During Therapy: WFL for tasks assessed/performed Overall Cognitive Status: Within Functional Limits for tasks assessed                                        General Comments General comments (skin integrity, edema, etc.): Daughter in the room    Exercises Total Joint Exercises Ankle Circles/Pumps: AROM;Both;20 reps;Supine Quad Sets: AROM;Right;10 reps;Supine Heel Slides: AROM;Right;10 reps;Supine   Assessment/Plan    PT Assessment Patient needs continued PT services  PT Problem List Decreased strength;Decreased range of motion;Decreased activity tolerance;Decreased balance;Decreased mobility;Pain;Decreased knowledge of use of DME       PT Treatment Interventions DME instruction;Functional mobility training;Therapeutic activities;Therapeutic exercise;Balance training;Patient/family education;Gait training    PT Goals (Current goals can be found in the Care Plan section)  Acute Rehab PT Goals Patient Stated Goal: To go to church PT Goal Formulation: With patient Time For Goal Achievement: 07/12/18 Potential to Achieve Goals: Good    Frequency 7X/week   Barriers to discharge        Co-evaluation               AM-PAC PT "6 Clicks" Daily Activity  Outcome Measure Difficulty turning over in bed (including adjusting bedclothes, sheets and blankets)?:  Unable Difficulty moving from lying on back to sitting on the side of the bed? : Unable Difficulty sitting down on and standing up from a chair with arms (e.g., wheelchair, bedside commode, etc,.)?: Unable Help needed moving to and from a bed to chair (including a wheelchair)?: A Lot Help needed walking in hospital room?: A Lot Help needed climbing 3-5 steps with a railing? : Total 6 Click Score: 8    End of Session Equipment Utilized During Treatment: Gait belt Activity Tolerance: Patient limited by pain Patient left: in chair;with call bell/phone within reach;with chair alarm set;with family/visitor present Nurse Communication: Mobility status;Patient requests pain meds PT Visit Diagnosis: Unsteadiness on feet (R26.81);Other abnormalities of gait and mobility (R26.89);Muscle weakness (generalized) (M62.81);Difficulty in walking, not elsewhere classified (R26.2);Pain Pain - Right/Left: Right Pain - part of body: Hip    Time: 1410-1451 PT Time Calculation (min) (ACUTE ONLY): 41 min   Charges:   PT Evaluation $PT Eval Low Complexity: 1 Low PT Treatments $Therapeutic Activity: 23-37 mins   Elwin Mocha, S-DPT Acute Care Rehab Student [phone]     06/28/2018, 4:55 PM

## 2018-06-28 NOTE — Progress Notes (Signed)
1200 Received pt from PACU, A&O x4. Right hip dressing dry and intact. Ice pack in place. Pt is c/o pain, medicated.

## 2018-06-28 NOTE — Discharge Instructions (Signed)

## 2018-06-28 NOTE — Anesthesia Procedure Notes (Signed)
Spinal  Patient location during procedure: OR Start time: 06/28/2018 7:35 AM End time: 06/28/2018 7:45 AM Staffing Anesthesiologist: Janeece Riggers, MD Preanesthetic Checklist Completed: patient identified, site marked, surgical consent, pre-op evaluation, timeout performed, IV checked, risks and benefits discussed and monitors and equipment checked Spinal Block Patient position: sitting Prep: DuraPrep Patient monitoring: heart rate, cardiac monitor, continuous pulse ox and blood pressure Approach: midline Location: L3-4 Injection technique: single-shot Needle Needle type: Sprotte  Needle gauge: 22 G Needle length: 9 cm Assessment Sensory level: T4 Additional Notes Mult attempts at mult levels

## 2018-06-29 LAB — CBC
HCT: 34.8 % — ABNORMAL LOW (ref 36.0–46.0)
Hemoglobin: 11 g/dL — ABNORMAL LOW (ref 12.0–15.0)
MCH: 30.5 pg (ref 26.0–34.0)
MCHC: 31.6 g/dL (ref 30.0–36.0)
MCV: 96.4 fL (ref 78.0–100.0)
Platelets: 149 10*3/uL — ABNORMAL LOW (ref 150–400)
RBC: 3.61 MIL/uL — ABNORMAL LOW (ref 3.87–5.11)
RDW: 13.7 % (ref 11.5–15.5)
WBC: 8.8 10*3/uL (ref 4.0–10.5)

## 2018-06-29 LAB — BASIC METABOLIC PANEL
Anion gap: 9 (ref 5–15)
BUN: 11 mg/dL (ref 8–23)
CO2: 25 mmol/L (ref 22–32)
Calcium: 8.1 mg/dL — ABNORMAL LOW (ref 8.9–10.3)
Chloride: 105 mmol/L (ref 98–111)
Creatinine, Ser: 1.72 mg/dL — ABNORMAL HIGH (ref 0.44–1.00)
GFR calc Af Amer: 32 mL/min — ABNORMAL LOW (ref >60)
GFR calc non Af Amer: 27 mL/min — ABNORMAL LOW (ref >60)
Glucose, Bld: 134 mg/dL — ABNORMAL HIGH (ref 70–99)
Potassium: 3.1 mmol/L — ABNORMAL LOW (ref 3.5–5.1)
Sodium: 139 mmol/L (ref 135–145)

## 2018-06-29 NOTE — Progress Notes (Signed)
Physical Therapy Treatment Patient Details Name: Ashley Hanson MRN: [id number] DOB: 1941-09-16 Today's Date: 06/29/2018    History of Present Illness Ashley Hanson is a 77 y.o. F s/p R THR Direct Anterior. PMH includes Migraines, LBP, Scoliosis, Dementia, Breast Cx, HTN, CAD, Depression, IBS, Diverticulosis, GERD, OA, RA.    PT Comments    Pt with cont slow progress this session. This visit requiring mod A to come to standing, is unable to do independently at this time.  When asked how she will do this at home by herself she replied "there will be problems". Without significant improvement with therapy unlikely safe to return home and I wold recommend short term SNF level therapy. Patient not keen of the idea and says she wants to go home.     Follow Up Recommendations  Follow surgeon's recommendation for DC plan and follow-up therapies;Supervision/Assistance - 24 hour     Equipment Recommendations  Rolling walker with 5" wheels;3in1 (PT)    Recommendations for Other Services OT consult     Precautions / Restrictions Precautions Precautions: Fall Precaution Comments: pt edcuated on ADL A/E for home use Restrictions Weight Bearing Restrictions: No RLE Weight Bearing: Weight bearing as tolerated    Mobility  Bed Mobility Overal bed mobility: Needs Assistance Bed Mobility: Supine to Sit     Supine to sit: Min assist     General bed mobility comments: OOB at entry  Transfers Overall transfer level: Needs assistance Equipment used: Rolling walker (2 wheeled) Transfers: Sit to/from [facility] Sit to Stand: Mod assist Stand pivot transfers: Min assist       General transfer comment: mod A to power up patient this session. struggling to stand due to reports of feeling weaker than normal.   Ambulation/Gait Ambulation/Gait assistance: Min assist Gait Distance (Feet): 10 Feet Assistive device: Rolling walker (2 wheeled) Gait Pattern/deviations: Step-to  pattern Gait velocity: decreased   General Gait Details: patient needed to have BM ambulated across room to commode chair. pt weak and nto able to ambulate great distances today. flexed posture, no BL knee buckle this visit.    Stairs             Wheelchair Mobility    Modified Rankin (Stroke Patients Only)       Balance Overall balance assessment: Needs assistance Sitting-balance support: Bilateral upper extremity supported;Feet supported Sitting balance-Leahy Scale: Fair Sitting balance - Comments: Pt was able to sit at EOB while PT put on gait belt.    Standing balance support: Bilateral upper extremity supported Standing balance-Leahy Scale: Poor Standing balance comment: Pt had flexed posture and reliant on UE support during static standing.                             Cognition Arousal/Alertness: Awake/alert Behavior During Therapy: WFL for tasks assessed/performed Overall Cognitive Status: Within Functional Limits for tasks assessed                                 General Comments: pt reports that she has mild dementia      Exercises      General Comments        Pertinent Vitals/Pain Pain Assessment: Faces Pain Score: 5  Faces Pain Scale: Hurts even more Pain Location: R Hip Pain Descriptors / Indicators: Grimacing;Guarding Pain Intervention(s): Limited activity within patient's tolerance;Monitored during session;Premedicated before session  Home Living Family/patient expects to be discharged to:: Private residence Living Arrangements: Other relatives;Children Available Help at Discharge: Family;Available PRN/intermittently Type of Home: Apartment Home Access: Level entry   Home Layout: One level Home Equipment: Grab bars - tub/shower;Shower seat;Cane - single point      Prior Function Level of Independence: Needs assistance  Gait / Transfers Assistance Needed: Pt reported walking with two canes, and had difficulty  walking further than 50 ft.  ADL's / Homemaking Assistance Needed: Pt required some help with bathing     PT Goals (current goals can now be found in the care plan section) Acute Rehab PT Goals Patient Stated Goal: go home PT Goal Formulation: With patient Time For Goal Achievement: 07/12/18 Potential to Achieve Goals: Good Progress towards PT goals: Progressing toward goals    Frequency    7X/week      PT Plan Current plan remains appropriate    Co-evaluation              AM-PAC PT "6 Clicks" Daily Activity  Outcome Measure  Difficulty turning over in bed (including adjusting bedclothes, sheets and blankets)?: Unable Difficulty moving from lying on back to sitting on the side of the bed? : Unable Difficulty sitting down on and standing up from a chair with arms (e.g., wheelchair, bedside commode, etc,.)?: Unable Help needed moving to and from a bed to chair (including a wheelchair)?: A Lot Help needed walking in hospital room?: A Lot Help needed climbing 3-5 steps with a railing? : Total 6 Click Score: 8    End of Session Equipment Utilized During Treatment: Gait belt Activity Tolerance: Patient limited by pain Patient left: in chair;with call bell/phone within reach;with chair alarm set;with family/visitor present Nurse Communication: Mobility status;Patient requests pain meds PT Visit Diagnosis: Unsteadiness on feet (R26.81);Other abnormalities of gait and mobility (R26.89);Muscle weakness (generalized) (M62.81);Difficulty in walking, not elsewhere classified (R26.2);Pain Pain - Right/Left: Right Pain - part of body: Hip     Time: 1540-1605 PT Time Calculation (min) (ACUTE ONLY): 25 min  Charges:  $Therapeutic Activity: 8-22 mins                     Reinaldo Berber, PT, DPT Acute Rehab Services Pager: [phone]     Reinaldo Berber 06/29/2018, 4:13 PM

## 2018-06-29 NOTE — Progress Notes (Signed)
Physical Therapy Treatment Patient Details Name: Ashley Hanson MRN: [id number] DOB: 1941-03-08 Today's Date: 06/29/2018    History of Present Illness Ashley Hanson is a 77 y.o. F s/p R THR Direct Anterior. PMH includes Migraines, LBP, Scoliosis, Dementia, Breast Cx, HTN, CAD, Depression, IBS, Diverticulosis, GERD, OA, RA.    PT Comments    Patient poorly progressing with therapy this visit. Pt limited by pain and ambulating 10' in room extremely slow with unsteadiness noted x2 buckle, increased risk for falling, requiring min-mod A to stabilize. Close chair follow for safety. Concerned for patients ability to ambulate safely without family assistance at home setting, pt reports she will be solo for long periods of the day at a time. Will cont to follow and progress as able.    Follow Up Recommendations  Follow surgeon's recommendation for DC plan and follow-up therapies;Supervision/Assistance - 24 hour     Equipment Recommendations  Rolling walker with 5" wheels;3in1 (PT)    Recommendations for Other Services OT consult     Precautions / Restrictions Precautions Precautions: Fall Restrictions Weight Bearing Restrictions: No RLE Weight Bearing: Weight bearing as tolerated    Mobility  Bed Mobility               General bed mobility comments: In chair at entry  Transfers Overall transfer level: Needs assistance Equipment used: Rolling walker (2 wheeled) Transfers: Sit to/from [facility] Sit to Stand: Min assist Stand pivot transfers: Min assist       General transfer comment: min A substantial increased time and effort   Ambulation/Gait Ambulation/Gait assistance: Min assist Gait Distance (Feet): 10 Feet Assistive device: Rolling walker (2 wheeled) Gait Pattern/deviations: Step-to pattern Gait velocity: decreasd   General Gait Details: pt extremly slow and painful, 2x buckle with min A to stabilize. cues for technique and sequencing. chair  follow for safety   Stairs             Wheelchair Mobility    Modified Rankin (Stroke Patients Only)       Balance Overall balance assessment: Needs assistance   Sitting balance-Leahy Scale: Fair     Standing balance support: Bilateral upper extremity supported Standing balance-Leahy Scale: Poor                              Cognition Arousal/Alertness: Awake/alert Behavior During Therapy: WFL for tasks assessed/performed Overall Cognitive Status: Within Functional Limits for tasks assessed                                        Exercises      General Comments        Pertinent Vitals/Pain Pain Assessment: Faces Faces Pain Scale: Hurts even more Pain Location: R Hip Pain Descriptors / Indicators: Grimacing;Guarding Pain Intervention(s): Limited activity within patient's tolerance;Premedicated before session;Monitored during session    Home Living                      Prior Function            PT Goals (current goals can now be found in the care plan section) Acute Rehab PT Goals PT Goal Formulation: With patient Time For Goal Achievement: 07/12/18 Potential to Achieve Goals: Good Progress towards PT goals: Progressing toward goals    Frequency    7X/week  PT Plan Current plan remains appropriate    Co-evaluation              AM-PAC PT "6 Clicks" Daily Activity  Outcome Measure  Difficulty turning over in bed (including adjusting bedclothes, sheets and blankets)?: Unable Difficulty moving from lying on back to sitting on the side of the bed? : Unable Difficulty sitting down on and standing up from a chair with arms (e.g., wheelchair, bedside commode, etc,.)?: Unable Help needed moving to and from a bed to chair (including a wheelchair)?: A Lot Help needed walking in hospital room?: A Lot Help needed climbing 3-5 steps with a railing? : Total 6 Click Score: 8    End of Session Equipment  Utilized During Treatment: Gait belt Activity Tolerance: Patient limited by pain Patient left: in chair;with call bell/phone within reach;with chair alarm set;with family/visitor present Nurse Communication: Mobility status;Patient requests pain meds PT Visit Diagnosis: Unsteadiness on feet (R26.81);Other abnormalities of gait and mobility (R26.89);Muscle weakness (generalized) (M62.81);Difficulty in walking, not elsewhere classified (R26.2);Pain Pain - Right/Left: Right Pain - part of body: Hip     Time: 7253-6644 PT Time Calculation (min) (ACUTE ONLY): 25 min  Charges:  $Gait Training: 8-22 mins $Therapeutic Activity: 8-22 mins                     Reinaldo Berber, PT, DPT Acute Rehab Services Pager: [phone]    Reinaldo Berber 06/29/2018, 12:45 PM

## 2018-06-29 NOTE — Progress Notes (Signed)
Subjective: 1 Day Post-Op Procedure(s) (LRB): RIGHT TOTAL HIP ARTHROPLASTY ANTERIOR APPROACH (Right)  Activity level:  wbat Diet tolerance:  ok Voiding:  ok Patient reports pain as mild.    Objective: Vital signs in last 24 hours: Temp:  [97 F (36.1 C)-98.4 F (36.9 C)] 98.2 F (36.8 C) (08/10 0452) Pulse Rate:  [40-65] 49 (08/10 0452) Resp:  [12-23] 15 (08/10 0452) BP: (83-122)/(42-61) 122/45 (08/10 0452) SpO2:  [94 %-100 %] 99 % (08/10 0452) Weight:  [105.2 kg] 105.2 kg (08/09 1203)  Labs: Recent Labs    06/29/18 0409  HGB 11.0*   Recent Labs    06/29/18 0409  WBC 8.8  RBC 3.61*  HCT 34.8*  PLT 149*   Recent Labs    06/29/18 0409  NA 139  K 3.1*  CL 105  CO2 25  BUN 11  CREATININE 1.72*  GLUCOSE 134*  CALCIUM 8.1*   No results for input(s): LABPT, INR in the last 72 hours.  Physical Exam:  Neurologically intact ABD soft Neurovascular intact Sensation intact distally Intact pulses distally Dorsiflexion/Plantar flexion intact Incision: dressing C/D/I and no drainage No cellulitis present Compartment soft  Assessment/Plan:  1 Day Post-Op Procedure(s) (LRB): RIGHT TOTAL HIP ARTHROPLASTY ANTERIOR APPROACH (Right) Advance diet Up with therapy Plan for discharge tomorrow Discharge home with home health if doing well and cleared by PT. Continue on ASA for DVT prevention. Follow up in office 2 weeks post op with Dr. Mayer Camel.  Rajohn Henery, Larwance Sachs 06/29/2018, 8:29 AM

## 2018-06-29 NOTE — Care Management (Signed)
Pt was set up with [facility] preoperatively.  Pt was advised of this and has no objection to [facility] providing [facility] services.    Pt states she needs a RW but already has a 3n1 at home.  RW ordered from [facility] to be delivered to room prior to d/c.

## 2018-06-29 NOTE — Plan of Care (Signed)
  Problem: Pain Managment: Goal: General experience of comfort will improve Outcome: Progressing   Problem: Safety: Goal: Ability to remain free from injury will improve Outcome: Progressing   Problem: Skin Integrity: Goal: Risk for impaired skin integrity will decrease Outcome: Progressing   Problem: Activity: Goal: Ability to avoid complications of mobility impairment will improve Outcome: Progressing   Problem: Elimination: Goal: Will not experience complications related to bowel motility Outcome: Progressing   Problem: Pain Managment: Goal: General experience of comfort will improve Outcome: Progressing   Problem: Safety: Goal: Ability to remain free from injury will improve Outcome: Progressing   Problem: Skin Integrity: Goal: Risk for impaired skin integrity will decrease Outcome: Progressing   Problem: Activity: Goal: Ability to avoid complications of mobility impairment will improve Outcome: Progressing   Problem: Clinical Measurements: Goal: Postoperative complications will be avoided or minimized Outcome: Progressing   Problem: Clinical Measurements: Goal: Postoperative complications will be avoided or minimized Outcome: Progressing

## 2018-06-29 NOTE — Evaluation (Signed)
Occupational Therapy Evaluation Patient Details Name: Ashley Hanson MRN: [id number] DOB: 1941-03-25 Today's Date: 06/29/2018    History of Present Illness Ashley Hanson is a 77 y.o. F s/p R THR Direct Anterior. PMH includes Migraines, LBP, Scoliosis, Dementia, Breast Cx, HTN, CAD, Depression, IBS, Diverticulosis, GERD, OA, RA.   Clinical Impression   Pt with decline in function and safety with ADLs and ADL mobility with decreased strength, balance and endurance. Pt requires mod A with LB ADLs, min A with toileting and min A with ADL mobility using RW. PTA, pt lived at home with her family independent with ADLs. Pt would benefit from acute OT services to address impairments to maximize level of function and safety    Follow Up Recommendations  Follow surgeon's recommendation for DC plan and follow-up therapies;Supervision/Assistance - 24 hour    Equipment Recommendations  Tub/shower bench;Other (comment)(reacher)    Recommendations for Other Services       Precautions / Restrictions Precautions Precautions: Fall Precaution Comments: pt edcuated on ADL A/E for home use Restrictions Weight Bearing Restrictions: No RLE Weight Bearing: Weight bearing as tolerated      Mobility Bed Mobility Overal bed mobility: Needs Assistance Bed Mobility: Supine to Sit     Supine to sit: Min assist     General bed mobility comments: min A with R LE  Transfers Overall transfer level: Needs assistance Equipment used: Rolling walker (2 wheeled) Transfers: Sit to/from [facility] Sit to Stand: Min assist Stand pivot transfers: Min assist       General transfer comment: min A substantial increased time and effort     Balance Overall balance assessment: Needs assistance Sitting-balance support: Bilateral upper extremity supported;Feet supported Sitting balance-Leahy Scale: Fair Sitting balance - Comments: Pt was able to sit at EOB while PT put on gait belt.    Standing  balance support: Bilateral upper extremity supported Standing balance-Leahy Scale: Poor Standing balance comment: Pt had flexed posture and reliant on UE support during static standing.                            ADL either performed or assessed with clinical judgement   ADL Overall ADL's : Needs assistance/impaired Eating/Feeding: Independent;Sitting   Grooming: Wash/dry hands;Wash/dry face;Minimal assistance;Standing   Upper Body Bathing: Sitting;Set up;Supervision/ safety   Lower Body Bathing: Moderate assistance   Upper Body Dressing : Set up;Supervision/safety;Sitting   Lower Body Dressing: Moderate assistance   Toilet Transfer: Minimal assistance;Ambulation;RW;BSC;Cueing for safety   Toileting- Clothing Manipulation and Hygiene: Minimal assistance;Sit to/from stand       Functional mobility during ADLs: Minimal assistance;Rolling walker;Cueing for safety       Vision Baseline Vision/History: Wears glasses Wears Glasses: Reading only Patient Visual Report: No change from baseline       Perception     Praxis      Pertinent Vitals/Pain Pain Assessment: 0-10 Pain Score: 5  Faces Pain Scale: Hurts even more Pain Location: R Hip Pain Descriptors / Indicators: Grimacing;Guarding Pain Intervention(s): Limited activity within patient's tolerance;Monitored during session;Premedicated before session     Hand Dominance Right   Extremity/Trunk Assessment Upper Extremity Assessment Upper Extremity Assessment: Defer to OT evaluation   Lower Extremity Assessment Lower Extremity Assessment: Defer to PT evaluation   Cervical / Trunk Assessment Cervical / Trunk Assessment: Other exceptions(scoliosis) Cervical / Trunk Exceptions:     Communication Communication Communication: No difficulties   Cognition Arousal/Alertness: Awake/alert Behavior During Therapy: [facility]  for tasks assessed/performed Overall Cognitive Status: Within Functional Limits for tasks  assessed                                 General Comments: pt reports that she has mild dementia   General Comments       Exercises     Shoulder Instructions      Home Living Family/patient expects to be discharged to:: Private residence Living Arrangements: Other relatives;Children Available Help at Discharge: Family;Available PRN/intermittently Type of Home: Apartment Home Access: Level entry     Home Layout: One level     Bathroom Shower/Tub: International aid/development worker Accessibility: Yes   Home Equipment: Grab bars - tub/shower;Shower seat;Cane - single point          Prior Functioning/Environment Level of Independence: Needs assistance  Gait / Transfers Assistance Needed: Pt reported walking with two canes, and had difficulty walking further than 50 ft.  ADL's / Homemaking Assistance Needed: Pt required some help with bathing            OT Problem List: Decreased strength;Decreased activity tolerance;Decreased knowledge of use of DME or AE;Impaired balance (sitting and/or standing);Obesity;Pain      OT Treatment/Interventions: Self-care/ADL training;DME and/or AE instruction;Therapeutic activities;Patient/family education    OT Goals(Current goals can be found in the care plan section) Acute Rehab OT Goals Patient Stated Goal: go home OT Goal Formulation: With patient Time For Goal Achievement: 07/13/18 Potential to Achieve Goals: Good ADL Goals Pt Will Perform Grooming: with supervision;with set-up;standing Pt Will Perform Lower Body Bathing: with min assist;sitting/lateral leans;sit to/from stand Pt Will Perform Lower Body Dressing: with min assist;sitting/lateral leans;sit to/from stand Pt Will Transfer to Toilet: with min guard assist;with supervision;ambulating Pt Will Perform Toileting - Clothing Manipulation and hygiene: with min guard assist  OT Frequency: Min 2X/week   Barriers to D/C:    no barriers       Co-evaluation               AM-PAC PT "6 Clicks" Daily Activity     Outcome Measure Help from another person eating meals?: None Help from another person taking care of personal grooming?: A Little Help from another person toileting, which includes using toliet, bedpan, or urinal?: A Little Help from another person bathing (including washing, rinsing, drying)?: A Lot Help from another person to put on and taking off regular upper body clothing?: None Help from another person to put on and taking off regular lower body clothing?: A Lot 6 Click Score: 18   End of Session Equipment Utilized During Treatment: Gait belt;Rolling walker;Other (comment)(BSC)  Activity Tolerance: Patient tolerated treatment well Patient left: Other (comment)(ambulating with PT)  OT Visit Diagnosis: Unsteadiness on feet (R26.81);Other abnormalities of gait and mobility (R26.89);Muscle weakness (generalized) (M62.81);Pain Pain - Right/Left: Right Pain - part of body: Hip                Time: 1093-2355 OT Time Calculation (min): 26 min Charges:  OT General Charges $OT Visit: 1 Visit OT Evaluation $OT Eval Low Complexity: 1 Low    Britt Bottom 06/29/2018, 1:31 PM

## 2018-06-30 LAB — CBC
HCT: 33.3 % — ABNORMAL LOW (ref 36.0–46.0)
Hemoglobin: 10.4 g/dL — ABNORMAL LOW (ref 12.0–15.0)
MCH: 30.2 pg (ref 26.0–34.0)
MCHC: 31.2 g/dL (ref 30.0–36.0)
MCV: 96.8 fL (ref 78.0–100.0)
Platelets: 143 10*3/uL — ABNORMAL LOW (ref 150–400)
RBC: 3.44 MIL/uL — ABNORMAL LOW (ref 3.87–5.11)
RDW: 13.4 % (ref 11.5–15.5)
WBC: 12.3 10*3/uL — ABNORMAL HIGH (ref 4.0–10.5)

## 2018-06-30 NOTE — Anesthesia Postprocedure Evaluation (Signed)
Anesthesia Post Note  Patient: Ashley Hanson  Procedure(s) Performed: RIGHT TOTAL HIP ARTHROPLASTY ANTERIOR APPROACH (Right Hip)     Patient location during evaluation: PACU Anesthesia Type: MAC Level of consciousness: awake and alert Pain management: pain level controlled Vital Signs Assessment: post-procedure vital signs reviewed and stable Respiratory status: spontaneous breathing, nonlabored ventilation, respiratory function stable and patient connected to nasal cannula oxygen Cardiovascular status: stable and blood pressure returned to baseline Postop Assessment: no apparent nausea or vomiting Anesthetic complications: no    Last Vitals:  Vitals:   06/30/18 0854 06/30/18 0859  BP: 131/90 131/90  Pulse:  (!) 57  Resp:    Temp:    SpO2:      Last Pain:  Vitals:   06/30/18 0453  TempSrc: Oral  PainSc:                  Florence Yeung

## 2018-06-30 NOTE — Progress Notes (Signed)
Physical Therapy Treatment Patient Details Name: Ashley Hanson MRN: [id number] DOB: 1940-11-29 Today's Date: 06/30/2018    History of Present Illness Ashley Hanson is a 77 y.o. F s/p R THR Direct Anterior. PMH includes Migraines, LBP, Scoliosis, Dementia, Breast Cx, HTN, CAD, Depression, IBS, Diverticulosis, GERD, OA, RA.    PT Comments    Pt demonstrates improved tolerance for bed mobs and transfers with decreased assistance required to perform stand pivot transfer to commode or recliner. Reviewed and initiated seated exercises in HEP.    Follow Up Recommendations  Follow surgeon's recommendation for DC plan and follow-up therapies;Supervision/Assistance - 24 hour     Equipment Recommendations  Rolling walker with 5" wheels;3in1 (PT)    Recommendations for Other Services       Precautions / Restrictions Precautions Precautions: Fall Restrictions Weight Bearing Restrictions: Yes RLE Weight Bearing: Weight bearing as tolerated    Mobility  Bed Mobility Overal bed mobility: Needs Assistance Bed Mobility: Supine to Sit     Supine to sit: Min assist     General bed mobility comments: Min A to bring RLE EOB  Transfers Overall transfer level: Needs assistance Equipment used: Rolling walker (2 wheeled) Transfers: Sit to/from Stand Sit to Stand: Min guard         General transfer comment: Min guard for safety from EOB to standing at [employer]  Ambulation/Gait Ambulation/Gait assistance: Min guard Gait Distance (Feet): 30 Feet Assistive device: Rolling walker (2 wheeled) Gait Pattern/deviations: Step-to pattern Gait velocity: decreased   General Gait Details: Pt assisted to [facility] and then to the recliner with Min guard.    Stairs             Wheelchair Mobility    Modified Rankin (Stroke Patients Only)       Balance Overall balance assessment: Needs assistance Sitting-balance support: No upper extremity supported;Feet supported Sitting balance-Leahy Scale:  Good     Standing balance support: Bilateral upper extremity supported Standing balance-Leahy Scale: Poor Standing balance comment: reliant on RW in standing                             Cognition Arousal/Alertness: Awake/alert Behavior During Therapy: WFL for tasks assessed/performed Overall Cognitive Status: Within Functional Limits for tasks assessed                                 General Comments: pt reports that she has mild dementia      Exercises Total Joint Exercises Ankle Circles/Pumps: AROM;Both;20 reps;Supine Long Arc Quad: AROM;Right;10 reps;Seated    General Comments        Pertinent Vitals/Pain Pain Assessment: 0-10 Pain Score: 5  Pain Location: R Hip Pain Descriptors / Indicators: Grimacing;Guarding Pain Intervention(s): Monitored during session;Premedicated before session;Repositioned;Ice applied    Home Living                      Prior Function            PT Goals (current goals can now be found in the care plan section) Acute Rehab PT Goals Patient Stated Goal: go home Progress towards PT goals: Progressing toward goals    Frequency    7X/week      PT Plan Current plan remains appropriate    Co-evaluation              AM-PAC PT "6 Clicks" Daily  Activity  Outcome Measure  Difficulty turning over in bed (including adjusting bedclothes, sheets and blankets)?: A Little Difficulty moving from lying on back to sitting on the side of the bed? : A Little Difficulty sitting down on and standing up from a chair with arms (e.g., wheelchair, bedside commode, etc,.)?: A Little Help needed moving to and from a bed to chair (including a wheelchair)?: A Little Help needed walking in hospital room?: A Little Help needed climbing 3-5 steps with a railing? : A Lot 6 Click Score: 17    End of Session Equipment Utilized During Treatment: Gait belt Activity Tolerance: Patient tolerated treatment well Patient  left: in chair;with call bell/phone within reach Nurse Communication: Mobility status PT Visit Diagnosis: Unsteadiness on feet (R26.81);Other abnormalities of gait and mobility (R26.89);Muscle weakness (generalized) (M62.81);Difficulty in walking, not elsewhere classified (R26.2);Pain Pain - Right/Left: Right Pain - part of body: Hip     Time: 7262-0355 PT Time Calculation (min) (ACUTE ONLY): 43 min  Charges:  $Gait Training: 8-22 mins $Therapeutic Activity: 23-37 mins                     Scheryl Marten PT, DPT      Ezrael Sam Sloan Leiter 06/30/2018, 1:10 PM

## 2018-06-30 NOTE — Progress Notes (Signed)
Subjective: 2 Days Post-Op Procedure(s) (LRB): RIGHT TOTAL HIP ARTHROPLASTY ANTERIOR APPROACH (Right)   Patient has more pain than she was expecting. She would like to be walking better before going home.  Activity level:  wbat Diet tolerance:  ok Voiding:  ok Patient reports pain as mild and moderate.    Objective: Vital signs in last 24 hours: Temp:  [97.7 F (36.5 C)-98.2 F (36.8 C)] 97.7 F (36.5 C) (08/11 0453) Pulse Rate:  [54-66] 54 (08/11 0453) Resp:  [20] 20 (08/10 1350) BP: (99-113)/(49-62) 106/49 (08/11 0453) SpO2:  [95 %-97 %] 96 % (08/11 0453)  Labs: Recent Labs    06/29/18 0409 06/30/18 0445  HGB 11.0* 10.4*   Recent Labs    06/29/18 0409 06/30/18 0445  WBC 8.8 12.3*  RBC 3.61* 3.44*  HCT 34.8* 33.3*  PLT 149* 143*   Recent Labs    06/29/18 0409  NA 139  K 3.1*  CL 105  CO2 25  BUN 11  CREATININE 1.72*  GLUCOSE 134*  CALCIUM 8.1*   No results for input(s): LABPT, INR in the last 72 hours.  Physical Exam:  Neurologically intact ABD soft Neurovascular intact Sensation intact distally Intact pulses distally Dorsiflexion/Plantar flexion intact Incision: dressing C/D/I and no drainage No cellulitis present Compartment soft  Assessment/Plan:  2 Days Post-Op Procedure(s) (LRB): RIGHT TOTAL HIP ARTHROPLASTY ANTERIOR APPROACH (Right) Advance diet Up with therapy Plan for discharge tomorrow Discharge home with home health if doing well and cleared by PT. Continue on ASA for DVT prevention. Follow up in office 2 weeks post op with Dr. Mayer Camel.  Ashley Hanson, Larwance Sachs 06/30/2018, 7:59 AM

## 2018-06-30 NOTE — Progress Notes (Signed)
Physical Therapy Treatment Patient Details Name: Ashley Hanson MRN: [id number] DOB: 03/16/1941 Today's Date: 06/30/2018    History of Present Illness Ashley Hanson is a 77 y.o. F s/p R THR Direct Anterior. PMH includes Migraines, LBP, Scoliosis, Dementia, Breast Cx, HTN, CAD, Depression, IBS, Diverticulosis, GERD, OA, RA.    PT Comments    Pt presents with improved tolerance for gait this session. Transfers and gait are Min guard bordering on Supervision with gait for short distances. Pt's daughter is present and assists patient back to bed. Pt is able to perform toileting hygiene and pull up her depends without any assistance.     Follow Up Recommendations  Follow surgeon's recommendation for DC plan and follow-up therapies;Supervision/Assistance - 24 hour     Equipment Recommendations  Rolling walker with 5" wheels;3in1 (PT)    Recommendations for Other Services       Precautions / Restrictions Precautions Precautions: Fall Restrictions Weight Bearing Restrictions: Yes RLE Weight Bearing: Weight bearing as tolerated    Mobility  Bed Mobility Overal bed mobility: Needs Assistance Bed Mobility: Sit to Supine     Supine to sit: Min assist Sit to supine: Min assist   General bed mobility comments: Min A for RLE  Transfers Overall transfer level: Needs assistance Equipment used: Rolling walker (2 wheeled) Transfers: Sit to/from Stand Sit to Stand: Min guard         General transfer comment: Min guard for safety from [facility] to standing at RW  Ambulation/Gait Ambulation/Gait assistance: Min guard Gait Distance (Feet): 50 Feet Assistive device: Rolling walker (2 wheeled) Gait Pattern/deviations: Step-to pattern Gait velocity: decreased   General Gait Details: Pt able to perform gait from [facility] just outside door and back to bed with Min guard and cues for sequencing. Improved clearance on RLE noted as distance increases. Pt has to perform 1 standing rest break prior to  getting back to bed due to pain and fatigue in UE's   Stairs             Wheelchair Mobility    Modified Rankin (Stroke Patients Only)       Balance Overall balance assessment: Needs assistance Sitting-balance support: No upper extremity supported;Feet supported Sitting balance-Leahy Scale: Good     Standing balance support: Bilateral upper extremity supported Standing balance-Leahy Scale: Poor Standing balance comment: reliant on RW in standing                             Cognition Arousal/Alertness: Awake/alert Behavior During Therapy: WFL for tasks assessed/performed Overall Cognitive Status: Within Functional Limits for tasks assessed                                 General Comments: pt reports that she has mild dementia      Exercises Total Joint Exercises Ankle Circles/Pumps: AROM;Both;20 reps;Supine Long Arc Quad: AROM;Right;10 reps;Seated    General Comments General comments (skin integrity, edema, etc.): daughter is present throughout session      Pertinent Vitals/Pain Pain Assessment: 0-10 Pain Score: 4  Pain Location: R Hip Pain Descriptors / Indicators: Grimacing;Guarding Pain Intervention(s): Monitored during session;Premedicated before session;Repositioned    Home Living                      Prior Function            PT Goals (current goals  can now be found in the care plan section) Acute Rehab PT Goals Patient Stated Goal: go home Progress towards PT goals: Progressing toward goals    Frequency    7X/week      PT Plan Current plan remains appropriate    Co-evaluation              AM-PAC PT "6 Clicks" Daily Activity  Outcome Measure  Difficulty turning over in bed (including adjusting bedclothes, sheets and blankets)?: A Lot Difficulty moving from lying on back to sitting on the side of the bed? : A Little Difficulty sitting down on and standing up from a chair with arms (e.g.,  wheelchair, bedside commode, etc,.)?: A Little Help needed moving to and from a bed to chair (including a wheelchair)?: A Little Help needed walking in hospital room?: A Little Help needed climbing 3-5 steps with a railing? : A Lot 6 Click Score: 16    End of Session Equipment Utilized During Treatment: Gait belt Activity Tolerance: Patient tolerated treatment well Patient left: with call bell/phone within reach;in bed;with family/visitor present Nurse Communication: Mobility status PT Visit Diagnosis: Unsteadiness on feet (R26.81);Other abnormalities of gait and mobility (R26.89);Muscle weakness (generalized) (M62.81);Difficulty in walking, not elsewhere classified (R26.2);Pain Pain - Right/Left: Right Pain - part of body: Hip     Time: 3662-9476 PT Time Calculation (min) (ACUTE ONLY): 30 min  Charges:  $Gait Training: 23-37 mins $Therapeutic Activity: 23-37 mins                     Ashley Hanson PT, DPT     Ashley Hanson 06/30/2018, 1:14 PM

## 2018-07-01 ENCOUNTER — Encounter (HOSPITAL_COMMUNITY): Payer: Self-pay | Admitting: Orthopedic Surgery

## 2018-07-01 LAB — CBC
HCT: 35.2 % — ABNORMAL LOW (ref 36.0–46.0)
Hemoglobin: 10.8 g/dL — ABNORMAL LOW (ref 12.0–15.0)
MCH: 30.3 pg (ref 26.0–34.0)
MCHC: 30.7 g/dL (ref 30.0–36.0)
MCV: 98.6 fL (ref 78.0–100.0)
Platelets: 192 10*3/uL (ref 150–400)
RBC: 3.57 MIL/uL — ABNORMAL LOW (ref 3.87–5.11)
RDW: 13.8 % (ref 11.5–15.5)
WBC: 12.5 10*3/uL — ABNORMAL HIGH (ref 4.0–10.5)

## 2018-07-01 NOTE — Progress Notes (Signed)
Physical Therapy Treatment Patient Details Name: Ashley Hanson MRN: [id number] DOB: 24-Oct-1941 Today's Date: 07/01/2018    History of Present Illness Ashley Hanson is a 77 y.o. F s/p R THR Direct Anterior. PMH includes Migraines, LBP, Scoliosis, Dementia, Breast Cx, HTN, CAD, Depression, IBS, Diverticulosis, GERD, OA, RA.    PT Comments    Pt performed gait training and functional mobility during session this am.  Gait extremely slow and guarded.  Pt with difficulty clearing R foot, reduced height of RW and foot clearance improved as she was able to use strength from her arms.  Plan for return home with support from her daughter.  Informed nursing and case management that she will require a youth height RW at d/c.     Follow Up Recommendations  Follow surgeon's recommendation for DC plan and follow-up therapies;Supervision/Assistance - 24 hour     Equipment Recommendations  Rolling walker with 5" wheels;3in1 (PT)(YOUTH height RW)    Recommendations for Other Services OT consult     Precautions / Restrictions Precautions Precautions: Fall Precaution Comments: pt edcuated on ADL A/E for home use Restrictions Weight Bearing Restrictions: No RLE Weight Bearing: Weight bearing as tolerated    Mobility  Bed Mobility Overal bed mobility: Needs Assistance Bed Mobility: Supine to Sit     Supine to sit: Min assist     General bed mobility comments: Min A for RLE to advance to edge of bed.    Transfers Overall transfer level: Needs assistance Equipment used: Rolling walker (2 wheeled) Transfers: Sit to/from Stand Sit to Stand: Min guard Stand pivot transfers: Min guard       General transfer comment: Cues for hand placement to and from seated surface.    Ambulation/Gait Ambulation/Gait assistance: Min guard Gait Distance (Feet): 60 Feet Assistive device: Rolling walker (2 wheeled) Gait Pattern/deviations: Step-to pattern;Trunk flexed;Antalgic;Decreased weight shift to  right Gait velocity: decreased   General Gait Details: Cues for upper trunk control, switched to youth height RW for improved fit and to allow for B tricep use.  Pt required cues for R foot clearance, R heel strike.  Posture becomes more flexed as gt progressed.  (Pt with scoliosis at baseline)   Stairs             Wheelchair Mobility    Modified Rankin (Stroke Patients Only)       Balance Overall balance assessment: Needs assistance   Sitting balance-Leahy Scale: Good       Standing balance-Leahy Scale: Poor                              Cognition Arousal/Alertness: Awake/alert Behavior During Therapy: WFL for tasks assessed/performed Overall Cognitive Status: Within Functional Limits for tasks assessed                                        Exercises      General Comments        Pertinent Vitals/Pain Pain Assessment: 0-10 Faces Pain Scale: Hurts even more Pain Location: R Hip Pain Descriptors / Indicators: Grimacing;Guarding Pain Intervention(s): Monitored during session;Repositioned    Home Living                      Prior Function            PT Goals (current goals can now  be found in the care plan section) Acute Rehab PT Goals Patient Stated Goal: go home Potential to Achieve Goals: Good Progress towards PT goals: Progressing toward goals    Frequency    7X/week      PT Plan Current plan remains appropriate    Co-evaluation              AM-PAC PT "6 Clicks" Daily Activity  Outcome Measure  Difficulty turning over in bed (including adjusting bedclothes, sheets and blankets)?: A Lot Difficulty moving from lying on back to sitting on the side of the bed? : A Little Difficulty sitting down on and standing up from a chair with arms (e.g., wheelchair, bedside commode, etc,.)?: A Little Help needed moving to and from a bed to chair (including a wheelchair)?: A Little Help needed walking in  hospital room?: A Little Help needed climbing 3-5 steps with a railing? : A Little 6 Click Score: 17    End of Session Equipment Utilized During Treatment: Gait belt Activity Tolerance: Patient tolerated treatment well Patient left: with call bell/phone within reach;in bed;with family/visitor present Nurse Communication: Mobility status PT Visit Diagnosis: Unsteadiness on feet (R26.81);Other abnormalities of gait and mobility (R26.89);Muscle weakness (generalized) (M62.81);Difficulty in walking, not elsewhere classified (R26.2);Pain Pain - Right/Left: Right Pain - part of body: Hip     Time: 0623-7628 PT Time Calculation (min) (ACUTE ONLY): 31 min  Charges:  $Gait Training: 8-22 mins $Therapeutic Activity: 8-22 mins                     Governor Rooks, PTA pager [phone]    Cristela Blue 07/01/2018, 11:59 AM

## 2018-07-01 NOTE — Care Management Important Message (Signed)
Important Message  Patient Details  Name: Ashley Hanson MRN: [id number] Date of Birth: [birth year]   Medicare Important Message Given:  Yes    Ashley Hanson Montine Circle 07/01/2018, 4:14 PM

## 2018-07-01 NOTE — Progress Notes (Signed)
PATIENT ID: Ashley Hanson  MRN: [id number]  DOB/AGE:  [birth year] / 77 y.o.  3 Days Post-Op Procedure(s) (LRB): RIGHT TOTAL HIP ARTHROPLASTY ANTERIOR APPROACH (Right)    PROGRESS NOTE Subjective: Patient is alert, oriented, no Nausea, no Vomiting, yes passing gas, . Taking PO well. Denies SOB, Chest or Calf Pain. Using Incentive Spirometer, PAS in place. Ambulate 25' Patient reports pain as  2/10  .    Objective: Vital signs in last 24 hours: Vitals:   06/30/18 0859 06/30/18 1611 06/30/18 1947 07/01/18 0435  BP: 131/90 (Abnormal) 116/50 (Abnormal) 98/50 (Abnormal) 106/54  Pulse: (Abnormal) 57 (Abnormal) 55 (Abnormal) 57 (Abnormal) 55  Resp:  18 16 16   Temp:  97.8 F (36.6 C) 98.4 F (36.9 C) 98 F (36.7 C)  TempSrc:  Oral Oral Oral  SpO2:  96% 97% 100%  Weight:      Height:          Intake/Output from previous day: I/O last 3 completed shifts: In: 1440 [P.O.:1440] Out: 600 [Urine:600]   Intake/Output this shift: Total I/O In: 540 [P.O.:540] Out: 1 [Urine:1]   LABORATORY DATA: Recent Labs    06/29/18 0409 06/30/18 0445 07/01/18 0320  WBC 8.8 12.3* 12.5*  HGB 11.0* 10.4* 10.8*  HCT 34.8* 33.3* 35.2*  PLT 149* 143* 192  NA 139  --   --   K 3.1*  --   --   CL 105  --   --   CO2 25  --   --   BUN 11  --   --   CREATININE 1.72*  --   --   GLUCOSE 134*  --   --   CALCIUM 8.1*  --   --     Examination: Neurologically intact ABD soft Neurovascular intact Sensation intact distally Intact pulses distally Dorsiflexion/Plantar flexion intact Incision: dressing C/D/I No cellulitis present Compartment soft} XR AP&Lat of hip shows well placed\fixed THA  Assessment:   3 Days Post-Op Procedure(s) (LRB): RIGHT TOTAL HIP ARTHROPLASTY ANTERIOR APPROACH (Right) ADDITIONAL DIAGNOSIS:  Expected Acute Blood Loss Anemia, HTN, depression, CAD  Plan: PT/OT WBAT, THA  DVT Prophylaxis: SCDx72 hrs, ASA 81 mg BID x 2 weeks  DISCHARGE PLAN: Home, today after PT  DISCHARGE  NEEDS: HHPT, Walker and 3-in-1 comode seat

## 2018-07-01 NOTE — Care Management Important Message (Signed)
Important Message  Patient Details  Name: Ashley Hanson MRN: [id number] Date of Birth: 09-Aug-1941   Medicare Important Message Given:  Yes    Erenest Rasher, RN 07/01/2018, 11:58 AM

## 2018-07-01 NOTE — Care Management (Signed)
Contacted [facility] for youth RW for home. Jonnie Finner RN CCM Case Mgmt phone [phone]

## 2018-07-01 NOTE — Progress Notes (Signed)
Dr Mayer Camel was given a message in regards to the patient and her daughter requesting to stay another day in the hospital.  The patient's concerns are that she feels like she needs another day of Physical Therapy and needs another day for continued pain management.  Will await for return call or message.

## 2018-07-01 NOTE — Progress Notes (Signed)
Occupational Therapy Treatment Patient Details Name: Ashley Hanson MRN: [id number] DOB: 1941-07-15 Today's Date: 07/01/2018    History of present illness Ashley Hanson is a 77 y.o. F s/p R THR Direct Anterior. PMH includes Migraines, LBP, Scoliosis, Dementia, Breast Cx, HTN, CAD, Depression, IBS, Diverticulosis, GERD, OA, RA.   OT comments  Pt making progress with functional goals,  pt required increased time and effort, very talkative. OT will continue to follow acutely  Follow Up Recommendations  Follow surgeon's recommendation for DC plan and follow-up therapies;Supervision/Assistance - 24 hour    Equipment Recommendations  Tub/shower bench;Other (comment)(reacher)    Recommendations for Other Services      Precautions / Restrictions Precautions Precautions: Fall Precaution Comments: pt edcuated on ADL A/E for home use Restrictions Weight Bearing Restrictions: No RLE Weight Bearing: Weight bearing as tolerated       Mobility Bed Mobility Overal bed mobility: Needs Assistance Bed Mobility: Supine to Sit     Supine to sit: Min assist     General bed mobility comments: pt up in recliner upon arrival  Transfers Overall transfer level: Needs assistance Equipment used: Rolling walker (2 wheeled) Transfers: Sit to/from Stand Sit to Stand: Min guard Stand pivot transfers: Min guard       General transfer comment: Cues for hand placement to and from seated surface.      Balance Overall balance assessment: Needs assistance Sitting-balance support: No upper extremity supported;Feet supported Sitting balance-Leahy Scale: Good     Standing balance support: Bilateral upper extremity supported;During functional activity;Single extremity supported Standing balance-Leahy Scale: Poor                             ADL either performed or assessed with clinical judgement   ADL Overall ADL's : Needs assistance/impaired Eating/Feeding: Independent;Sitting    Grooming: Wash/dry hands;Standing;Min guard                   Toilet Transfer: Minimal assistance;Ambulation;RW;BSC;Cueing for safety;Min guard   Toileting- Clothing Manipulation and Hygiene: Minimal assistance;Sit to/from stand       Functional mobility during ADLs: Minimal assistance;Rolling walker;Cueing for safety General ADL Comments: pt required increased time and effort, very talkative     Vision Baseline Vision/History: Wears glasses Wears Glasses: Reading only Patient Visual Report: No change from baseline     Perception     Praxis      Cognition Arousal/Alertness: Awake/alert Behavior During Therapy: WFL for tasks assessed/performed Overall Cognitive Status: Within Functional Limits for tasks assessed                                          Exercises     Shoulder Instructions       General Comments      Pertinent Vitals/ Pain       Pain Assessment: 0-10 Pain Score: 6  Faces Pain Scale: Hurts even more Pain Location: R Hip Pain Descriptors / Indicators: Grimacing;Guarding Pain Intervention(s): Limited activity within patient's tolerance;Monitored during session;Premedicated before session;Repositioned  Home Living                                          Prior Functioning/Environment  Frequency  Min 2X/week        Progress Toward Goals  OT Goals(current goals can now be found in the care plan section)  Progress towards OT goals: Progressing toward goals  Acute Rehab OT Goals Patient Stated Goal: go home  Plan Discharge plan remains appropriate    Co-evaluation                 AM-PAC PT "6 Clicks" Daily Activity     Outcome Measure   Help from another person eating meals?: None Help from another person taking care of personal grooming?: A Little Help from another person toileting, which includes using toliet, bedpan, or urinal?: A Little Help from another person  bathing (including washing, rinsing, drying)?: A Lot Help from another person to put on and taking off regular upper body clothing?: None Help from another person to put on and taking off regular lower body clothing?: A Lot 6 Click Score: 18    End of Session Equipment Utilized During Treatment: Gait belt;Rolling walker;Other (comment)(BSC)  OT Visit Diagnosis: Unsteadiness on feet (R26.81);Other abnormalities of gait and mobility (R26.89);Muscle weakness (generalized) (M62.81);Pain Pain - Right/Left: Right Pain - part of body: Hip   Activity Tolerance Patient tolerated treatment well   Patient Left in chair;with call bell/phone within reach   Nurse Communication      Functional Assessment Tool Used: AM-PAC 6 Clicks Daily Activity   Time: 2035-5974 OT Time Calculation (min): 27 min  Charges: OT General Charges $OT Visit: 1 Visit OT Treatments $Self Care/Home Management : 8-22 mins     Britt Bottom 07/01/2018, 2:56 PM

## 2018-07-01 NOTE — Discharge Summary (Addendum)
Patient ID: Ashley Hanson MRN: [id number] DOB/AGE: 77-Mar-1942 60 y.o.  Admit date: 06/28/2018 Discharge date: 07/02/2018  Admission Diagnoses:  Principal Problem:   Osteoarthritis of left hip Active Problems:   Primary osteoarthritis of right hip   Discharge Diagnoses:  Same  Past Medical History:  Diagnosis Date  . Anginal pain [facility])    sees [name]; reportedly negative stress/echo 2012  . Arthritis    "hands, elbows, shoulders, and back" (06/28/2018)  . Breast cancer, left breast ([place])   . Carpal tunnel syndrome    bilaterally  . Chronic lower back pain   . Complication of anesthesia    "difficutly waking up and hypothermia and stopped breathing"  . Confusion   . Dementia    "stage 4; w/both long term and short term memory loss" (06/28/2018)  . Depression   . Diverticulosis   . Esophageal reflux   . Hypertension    sees [name]  . Hypothyroidism   . IBS (irritable bowel syndrome)   . IBS (irritable bowel syndrome)   . Melanoma ([place])    2nd digit right hand; bottom of left foot" (06/28/2018)  . Migraines    "stopped having them 3-4 years ago" (06/28/2018)  . Myocardial infarction [facility])    "a mini one; early 2000s maybe"  . Osteoporosis   . PONV (postoperative nausea and vomiting)   . Refusal of blood transfusions as patient is Jehovah's Witness   . Scoliosis     Surgeries: Procedure(s): RIGHT TOTAL HIP ARTHROPLASTY ANTERIOR APPROACH on 06/28/2018   Consultants:   Discharged Condition: Improved  Hospital Course: Ashley Hanson is an 77 y.o. female who was admitted 06/28/2018 for operative treatment ofOsteoarthritis of left hip. Patient has severe unremitting pain that affects sleep, daily activities, and work/hobbies. After pre-op clearance the patient was taken to the operating room on 06/28/2018 and underwent  Procedure(s): RIGHT TOTAL HIP ARTHROPLASTY ANTERIOR APPROACH.    Patient was given perioperative antibiotics:  Anti-infectives (From admission,  onward)   Start     Dose/Rate Route Frequency Ordered Stop   06/28/18 0630  ceFAZolin (ANCEF) IVPB 2g/100 mL premix     2 g 200 mL/hr over 30 Minutes Intravenous To ShortStay Surgical 06/27/18 0844 06/28/18 0730       Patient was given sequential compression devices, early ambulation, and chemoprophylaxis to prevent DVT.  Patient benefited maximally from hospital stay and there were no complications.    Recent vital signs:  Patient Vitals for the past 24 hrs:  BP Temp Temp src Pulse Resp SpO2  07/02/18 0410 (Abnormal) 97/56 97.7 F (36.5 C) Oral (Abnormal) 56 16 95 %  07/01/18 2240 (Abnormal) 107/49 no documentation no documentation 66 no documentation no documentation  07/01/18 1957 (Abnormal) 112/54 no documentation no documentation (Abnormal) 55 16 98 %  07/01/18 1630 (Abnormal) 95/45 98.3 F (36.8 C) Oral (Abnormal) 50 no documentation 98 %  07/01/18 1019 (Abnormal) 106/54 no documentation no documentation (Abnormal) 55 no documentation no documentation  07/01/18 1018 (Abnormal) 106/54 no documentation no documentation (Abnormal) 55 no documentation no documentation     Recent laboratory studies:  Recent Labs    06/30/18 0445 07/01/18 0320  WBC 12.3* 12.5*  HGB 10.4* 10.8*  HCT 33.3* 35.2*  PLT 143* 192     Discharge Medications:   Allergies as of 07/02/2018    Allergen Reactions Comment   Adhesive [tape] Dermatitis Severe irritation of skin   Plavix [clopidogrel Bisulfate] Itching, Other (See Comments) Causes migraines. Itching all over  the body.   Warfarin And Related Other (See Comments) Blood thinners - cause migraines in patient.      Medication List    Stop taking these medications   Aspirin 81 MG EC tablet Replaced by:  aspirin EC 81 MG tablet   pregabalin 300 MG capsule Commonly known as:  LYRICA     Take these medications   ALPRAZolam 0.25 MG tablet Commonly known as:  XANAX Take 0.25-0.5 mg by mouth See admin instructions. Take one tab in am, one  tab in afternoon, and two tabs at bedtime.   aspirin EC 81 MG tablet Take 1 tablet (81 mg total) by mouth 2 (two) times daily. Replaces:  Aspirin 81 MG EC tablet   BIOFREEZE 4 % Gel Generic drug:  Menthol (Topical Analgesic) Apply 1 application topically 3 (three) times daily as needed (body pain).   CALCIUM 1200 PO Take 1 tablet by mouth 2 (two) times daily.   diltiazem 120 MG 24 hr capsule Commonly known as:  CARDIZEM CD Take 120 mg by mouth daily.   donepezil 10 MG tablet Commonly known as:  ARICEPT TAKE 1 TABLET AT BEDTIME   furosemide 40 MG tablet Commonly known as:  LASIX Take 120 mg by mouth 2 (two) times daily.   gabapentin 600 MG tablet Commonly known as:  NEURONTIN Take 600 mg by mouth 3 (three) times daily.   losartan 50 MG tablet Commonly known as:  COZAAR Take 50 mg by mouth daily.   memantine 10 MG tablet Commonly known as:  NAMENDA Take 1 tablet (10 mg total) by mouth 2 (two) times daily.   oxyCODONE-acetaminophen 5-325 MG tablet Commonly known as:  PERCOCET/ROXICET Take 1 tablet by mouth every 4 (four) hours as needed for severe pain.   potassium chloride 10 MEQ tablet Commonly known as:  K-DUR,KLOR-CON Take 10 mEq by mouth daily.   PRESERVISION AREDS PO Take 1 capsule by mouth 2 (two) times daily.   PROBIOTIC PO Take 1 capsule by mouth daily.   propranolol 60 MG tablet Commonly known as:  INDERAL Take 1 tablet (60 mg total) by mouth 2 (two) times daily.   sodium chloride 2 % ophthalmic solution Commonly known as:  MURO 128 Place 1 drop into both eyes 4 (four) times daily.   SYSTANE COMPLETE 0.6 % Soln Generic drug:  Propylene Glycol Place 1 drop into both eyes as needed.   SYSTANE OVERNIGHT THERAPY 0.3 % Gel ophthalmic ointment Generic drug:  hypromellose Place 1 application into both eyes at bedtime.   tiZANidine 2 MG tablet Commonly known as:  ZANAFLEX Take 1 tablet (2 mg total) by mouth every 6 (six) hours as needed.   Vitamin  D-3 1000 units Caps Take 1 tablet by mouth daily.        Durable Medical Equipment  (From admission, onward)         Start     Ordered   06/28/18 1155  DME Walker rolling  Once    Question:  Patient needs a walker to treat with the following condition  Answer:  Status post right hip replacement   06/28/18 1154   06/28/18 1155  DME 3 n 1  Once     06/28/18 1154           Discharge Care Instructions  (From admission, onward)         Start     Ordered   07/01/18 0000  Change dressing    Comments:  You may  change your dressing only if window has > 40% drainage   07/01/18 0628          Diagnostic Studies: Dg Chest 2 View  Result Date: 06/19/2018 CLINICAL DATA:  Preop right hip replacement. History of breast cancer and hypertension EXAM: CHEST - 2 VIEW COMPARISON:  09/24/2012 FINDINGS: Surgical clips in the left axilla. Heart and mediastinal contours are within normal limits. No focal opacities or effusions. No acute bony abnormality. IMPRESSION: No active cardiopulmonary disease. Electronically Signed   By: Rolm Baptise M.D.   On: 06/19/2018 08:08   Dg C-arm 1-60 Min  Result Date: 06/28/2018 CLINICAL DATA:  77 year old female with a history of hip surgery EXAM: DG C-ARM 61-120 MIN; OPERATIVE RIGHT HIP WITH PELVIS COMPARISON:  None. FINDINGS: Limited intraoperative fluoroscopic spot images during right hip surgery. Changes of right hip arthroplasty. IMPRESSION: Limited intraoperative fluoroscopic spot images of right hip arthroplasty. No immediate complicating features. Please refer to the dictated operative report for full details of intraoperative findings and procedure. Electronically Signed   By: Corrie Mckusick D.O.   On: 06/28/2018 12:17   Dg Hip Operative Unilat W Or W/o Pelvis Right  Result Date: 06/28/2018 CLINICAL DATA:  77 year old female with a history of hip surgery EXAM: DG C-ARM 61-120 MIN; OPERATIVE RIGHT HIP WITH PELVIS COMPARISON:  None. FINDINGS: Limited  intraoperative fluoroscopic spot images during right hip surgery. Changes of right hip arthroplasty. IMPRESSION: Limited intraoperative fluoroscopic spot images of right hip arthroplasty. No immediate complicating features. Please refer to the dictated operative report for full details of intraoperative findings and procedure. Electronically Signed   By: Corrie Mckusick D.O.   On: 06/28/2018 12:17    Disposition: Discharge disposition: 03-Skilled Nursing Facility       Discharge Instructions    Call MD / Call 911   Complete by:  As directed    If you experience chest pain or shortness of breath, CALL 911 and be transported to the hospital emergency room.  If you develope a fever above 101 F, pus (white drainage) or increased drainage or redness at the wound, or calf pain, call your surgeon's office.   Change dressing   Complete by:  As directed    You may change your dressing only if window has > 40% drainage   Constipation Prevention   Complete by:  As directed    Drink plenty of fluids.  Prune juice may be helpful.  You may use a stool softener, such as Colace (over the counter) 100 mg twice a day.  Use MiraLax (over the counter) for constipation as needed.   Diet - low sodium heart healthy   Complete by:  As directed    Increase activity slowly as tolerated   Complete by:  As directed       Follow-up Information    Frederik Pear, MD In 2 weeks.   Specialty:  Orthopedic Surgery Contact information: [place] [id number] [phone]        [facility] Follow up.   Specialty:  Home Health Services Why:  Therapist will contact you to set up appointment.  Contact information: [address] [place] [address] [phone]            Signed: [name] 07/02/2018, 7:05 AM

## 2018-07-01 NOTE — Progress Notes (Signed)
Physical Therapy Treatment Patient Details Name: Ashley Hanson MRN: [id number] DOB: [birth year] Today's Date: 07/01/2018    History of Present Illness Ashley Hanson is a 77 y.o. F s/p R THR Direct Anterior. PMH includes Migraines, LBP, Scoliosis, Dementia, Breast Cx, HTN, CAD, Depression, IBS, Diverticulosis, GERD, OA, RA.    PT Comments    Pt performed LE therapeutic exercises this pm followed by gait training.  Pt's gait distance regressed with c/o back pain and poor posture.  Returned to chair post session awaiting bath from NT.  Plan for review of HEP in am with progression of gait to tolerance.      Follow Up Recommendations  Follow surgeon's recommendation for DC plan and follow-up therapies;Supervision/Assistance - 24 hour     Equipment Recommendations  Rolling walker with 5" wheels;3in1 (PT)(YOUTH Height RW.  )    Recommendations for Other Services       Precautions / Restrictions Precautions Precautions: Fall Precaution Comments: pt edcuated on ADL A/E for home use Restrictions Weight Bearing Restrictions: Yes RLE Weight Bearing: Weight bearing as tolerated    Mobility  Bed Mobility               General bed mobility comments: pt up in recliner upon arrival  Transfers Overall transfer level: Needs assistance Equipment used: Rolling walker (2 wheeled) Transfers: Sit to/from Stand Sit to Stand: Supervision Stand pivot transfers: Min guard       General transfer comment: Cues for hand placement to and from seated surface.  Pt remains to attempt to pull on the RW into standing.    Ambulation/Gait Ambulation/Gait assistance: Min guard Gait Distance (Feet): 30 Feet Assistive device: Rolling walker (2 wheeled) Gait Pattern/deviations: Step-to pattern;Trunk flexed;Antalgic;Decreased weight shift to right     General Gait Details: Pt performed step to pattern with improve step progression.  Cadence this pm appears slower due to back pain.  Pt slow and guarded  and unable to progress gait distance.    Stairs             Wheelchair Mobility    Modified Rankin (Stroke Patients Only)       Balance Overall balance assessment: Needs assistance Sitting-balance support: No upper extremity supported;Feet supported Sitting balance-Leahy Scale: Good     Standing balance support: Bilateral upper extremity supported;During functional activity;Single extremity supported Standing balance-Leahy Scale: Poor                              Cognition Arousal/Alertness: Awake/alert Behavior During Therapy: WFL for tasks assessed/performed Overall Cognitive Status: Within Functional Limits for tasks assessed                                        Exercises Total Joint Exercises Ankle Circles/Pumps: AROM;Both;20 reps;Supine Quad Sets: AROM;Right;10 reps;Supine Short Arc Quad: AROM;Right;10 reps;Supine Heel Slides: Right;10 reps;Supine;AAROM Hip ABduction/ADduction: AAROM;Right;Supine    General Comments        Pertinent Vitals/Pain Pain Assessment: 0-10 Pain Score: 5  Pain Location: R Hip Pain Descriptors / Indicators: Grimacing;Guarding Pain Intervention(s): Monitored during session;Repositioned    Home Living                      Prior Function            PT Goals (current goals can now be found in the  care plan section) Acute Rehab PT Goals Patient Stated Goal: go home Potential to Achieve Goals: Good Progress towards PT goals: Progressing toward goals    Frequency    7X/week      PT Plan Current plan remains appropriate    Co-evaluation              AM-PAC PT "6 Clicks" Daily Activity  Outcome Measure  Difficulty turning over in bed (including adjusting bedclothes, sheets and blankets)?: A Lot Difficulty moving from lying on back to sitting on the side of the bed? : A Little Difficulty sitting down on and standing up from a chair with arms (e.g., wheelchair, bedside  commode, etc,.)?: A Little Help needed moving to and from a bed to chair (including a wheelchair)?: A Little Help needed walking in hospital room?: A Little Help needed climbing 3-5 steps with a railing? : A Little 6 Click Score: 17    End of Session Equipment Utilized During Treatment: Gait belt Activity Tolerance: Patient tolerated treatment well Patient left: with call bell/phone within reach;in bed;with family/visitor present Nurse Communication: Mobility status PT Visit Diagnosis: Unsteadiness on feet (R26.81);Other abnormalities of gait and mobility (R26.89);Muscle weakness (generalized) (M62.81);Difficulty in walking, not elsewhere classified (R26.2);Pain Pain - Right/Left: Right Pain - part of body: Hip     Time: 4917-9150 PT Time Calculation (min) (ACUTE ONLY): 36 min  Charges:  $Gait Training: 8-22 mins $Therapeutic Exercise: 8-22 mins                     Governor Rooks, PTA pager [phone]    Cristela Blue 07/01/2018, 4:45 PM

## 2018-07-02 NOTE — Progress Notes (Signed)
Physical Therapy Treatment Patient Details Name: Ashley Hanson MRN: [id number] DOB: [birth year] Today's Date: 07/02/2018    History of Present Illness Ashley Hanson is a 77 y.o. F s/p R THR Direct Anterior. PMH includes Migraines, LBP, Scoliosis, Dementia, Breast Cx, HTN, CAD, Depression, IBS, Diverticulosis, GERD, OA, RA.    PT Comments    Pt performed gait training with decreased pain and improved cadence during session this am.  Pt to d/c home with support from her daughter.  Will f/u for progression to standing exercises if patient remains hospitalized.  Pt is safe to d/c home from a mobility stand point at this time.  She has made a drastic improvement from 30 ft min assist to 200 ft S.    Follow Up Recommendations  Follow surgeon's recommendation for DC plan and follow-up therapies;Supervision/Assistance - 24 hour     Equipment Recommendations  Rolling walker with 5" wheels;3in1 (PT)(Youth RW)    Recommendations for Other Services       Precautions / Restrictions Precautions Precautions: Fall Precaution Comments: pt edcuated on ADL A/E for home use Restrictions Weight Bearing Restrictions: Yes RLE Weight Bearing: Weight bearing as tolerated    Mobility  Bed Mobility               General bed mobility comments: pt up in recliner upon arrival  Transfers Overall transfer level: Needs assistance Equipment used: Rolling walker (2 wheeled)(youth height) Transfers: Sit to/from Stand Sit to Stand: Supervision         General transfer comment: Pt with good recall of hand placement to and from seated surface.  Pt slow and guarded but able to achieve standing unassisted.  PTA provided supervision for safety.    Ambulation/Gait Ambulation/Gait assistance: Supervision Gait Distance (Feet): 200 Feet Assistive device: Rolling walker (2 wheeled) Gait Pattern/deviations: Trunk flexed;Antalgic;Decreased weight shift to right;Step-through pattern Gait velocity: decreased    General Gait Details: Pt with decreased pain presentation today and able to progress to step through sequencing.  Pt with improved cadence and tolerance to activity.  Presents with IR of R foot likely due to hip tightness.     Stairs Stairs: (Pt denies stairs for entry into home.  )           Wheelchair Mobility    Modified Rankin (Stroke Patients Only)       Balance Overall balance assessment: Needs assistance   Sitting balance-Leahy Scale: Good       Standing balance-Leahy Scale: Poor                              Cognition Arousal/Alertness: Awake/alert Behavior During Therapy: WFL for tasks assessed/performed Overall Cognitive Status: Within Functional Limits for tasks assessed                                 General Comments: pt reports that she has mild dementia      Exercises Total Joint Exercises Ankle Circles/Pumps: AROM;Both;20 reps;Supine Quad Sets: AROM;Right;10 reps;Supine Short Arc Quad: AROM;Right;10 reps;Supine Heel Slides: Right;10 reps;Supine;AAROM Hip ABduction/ADduction: AAROM;Right;Supine Knee Flexion: AROM;Right;10 reps;Supine    General Comments        Pertinent Vitals/Pain Pain Assessment: 0-10 Pain Score: 2  Pain Location: R Hip Pain Descriptors / Indicators: Discomfort Pain Intervention(s): Monitored during session;Repositioned(refused cold pack.  )    Home Living  Prior Function            PT Goals (current goals can now be found in the care plan section) Acute Rehab PT Goals Patient Stated Goal: go home Potential to Achieve Goals: Good Progress towards PT goals: Progressing toward goals    Frequency    7X/week      PT Plan Current plan remains appropriate    Co-evaluation              AM-PAC PT "6 Clicks" Daily Activity  Outcome Measure  Difficulty turning over in bed (including adjusting bedclothes, sheets and blankets)?: A Lot Difficulty  moving from lying on back to sitting on the side of the bed? : A Little Difficulty sitting down on and standing up from a chair with arms (e.g., wheelchair, bedside commode, etc,.)?: A Little Help needed moving to and from a bed to chair (including a wheelchair)?: A Little Help needed walking in hospital room?: A Little Help needed climbing 3-5 steps with a railing? : A Little 6 Click Score: 17    End of Session Equipment Utilized During Treatment: Gait belt Activity Tolerance: Patient tolerated treatment well Patient left: with call bell/phone within reach;in bed;with family/visitor present Nurse Communication: Mobility status PT Visit Diagnosis: Unsteadiness on feet (R26.81);Other abnormalities of gait and mobility (R26.89);Muscle weakness (generalized) (M62.81);Difficulty in walking, not elsewhere classified (R26.2);Pain Pain - Right/Left: Right Pain - part of body: Hip     Time: 9432-7614 PT Time Calculation (min) (ACUTE ONLY): 18 min  Charges:  $Gait Training: 8-22 mins $Therapeutic Exercise: 8-22 mins                     Governor Rooks, PTA pager [phone]    Cristela Blue 07/02/2018, 12:09 PM

## 2018-07-02 NOTE — Care Plan (Signed)
Met with patient at bedside today. She insists that she is going home. She refuses to go to rehab facility. She states that her daughter will be able to pick her up this afternoon after work. I will let the [facility] agency know and adjust her schedule to be seen daily. Will add OT as well.   MD updated on plan   Thank you

## 2018-07-02 NOTE — Progress Notes (Signed)
PATIENT ID: Ashley Hanson  MRN: [id number]  DOB/AGE:  [birth year] / 77 y.o.  4 Days Post-Op Procedure(s) (LRB): RIGHT TOTAL HIP ARTHROPLASTY ANTERIOR APPROACH (Right)    PROGRESS NOTE Subjective: Patient is alert, oriented, no Nausea, no Vomiting, yes passing gas. Taking PO well. Denies SOB, Chest or Calf Pain. Using Incentive Spirometer, PAS in place. Ambulate 30', Patient reports pain as 2/10 .    Objective: Vital signs in last 24 hours: Vitals:   07/01/18 1630 07/01/18 1957 07/01/18 2240 07/02/18 0410  BP: (Abnormal) 95/45 (Abnormal) 112/54 (Abnormal) 107/49 (Abnormal) 97/56  Pulse: (Abnormal) 50 (Abnormal) 55 66 (Abnormal) 56  Resp:  16  16  Temp: 98.3 F (36.8 C)   97.7 F (36.5 C)  TempSrc: Oral   Oral  SpO2: 98% 98%  95%  Weight:      Height:          Intake/Output from previous day: I/O last 3 completed shifts: In: 1260 [P.O.:1260] Out: 1 [Urine:1]   Intake/Output this shift: No intake/output data recorded.   LABORATORY DATA: Recent Labs    06/30/18 0445 07/01/18 0320  WBC 12.3* 12.5*  HGB 10.4* 10.8*  HCT 33.3* 35.2*  PLT 143* 192    Examination: Neurologically intact ABD soft Neurovascular intact Sensation intact distally Intact pulses distally Dorsiflexion/Plantar flexion intact Incision: dressing C/D/I No cellulitis present Compartment soft}  Assessment:   4 Days Post-Op Procedure(s) (LRB): RIGHT TOTAL HIP ARTHROPLASTY ANTERIOR APPROACH (Right) ADDITIONAL DIAGNOSIS: Expected Acute Blood Loss Anemia, Hypertension, depression, CAD Anticipated LOS equal to or greater than 2 midnights due to - Age 77 and older with one or more of the following:  - Obesity  - Expected need for hospital services (PT, OT, Nursing) required for safe   Plan: PT/OT WBAT, AROM and PROM  DVT Prophylaxis:  SCDx72hrs, ASA 81 mg BID x 2 weeks DISCHARGE PLAN: Skilled Nursing Facility/Rehab, patient deconditioned, slow to rehab DISCHARGE NEEDS: HHPT, Walker and 3-in-1 comode  seat     [name] 07/02/2018, 6:28 AM

## 2018-07-31 NOTE — Progress Notes (Signed)
[facility]  PATIENT: Ashley Hanson DOB: [birth year]   REASON FOR VISIT: Follow-up for  migraine, gait abnormality mild cognitive impairment HISTORY FROM: patient and daughter Ashley Hanson    HISTORY OF PRESENT ILLNESS:Ashley Hanson is a 77 years old right-handed female, last clinical visit was with Dr. Jim Like August 2015 previously a patient of Dr. Erling Cruz  She had a history of chronic ocular migraine the past, presented with colorful visual distortion, oftentimes followed by retro-orbital area headaches, when she was younger she has headaches frequently, sometimes the point of passing out, her headache has much improved, especially while take gabapentin 600 mg twice a day, since September 2016, she began to have frequent almost daily pressure behind both eyes, also complains of decreased vision, she has tried over-the-counter ibuprofen Tylenol did not work for her,  She had a history of macular degeneration with decreased vision to begin with, also  had bilateral cataract surgery, recent diagnosis of glaucoma by ophthalmologist Dr. Carolynn Sayers She complains of many years history of gradual onset memory trouble, she retired as a Therapist, occupational, she still able to drive, lives in apartment by herself, manage her whole own finances, Gait difficulty due to bilateral feet pain, scoliosis, chronic low back pain.  UPDATE Nov 01 2015:YYI reviewed a note from her ophthalmologist Dr. Katy Fitch dated October 28 2015, patient complains of visual distortions of shapes and sizes, movement of visual imagings, Funduscopy examination showed normal appearance of optic nerve with healthy pink cream and normal appearance nerve fiber layers, macular showed dry age-related macular degeneration, vessels are normal,  vitreous was normal, The conclusion was suspected glaucoma, OD was 12, OS was 14, reported that her father had a history of glaucoma She was put on 3 eye drops, she has not started yet, she  also had bilateral conjunctiva erythematous change, was diagnosed with recurrent iridocystitis Previous laboratory evaluation showed elevated C reactive protein of 21, with normal ESR, She wear sunglasses, she complains of light sensitive, she continues both retro-orbital area pressure pain, constant 9/10, x24 hours. Topamax 150m bid did not help her headaches  UPDATE Nov 18 2015:YY Her headache and conjunctiva erythematous has much improved, everytime she took Fioricet, she had GI side effect, feel sick, band like sensation. She was started on prednisone since December twelfth 2016, started from 20 mg daily, now 10 milligrams daily, she can tolerate the medication well, which has helped her headaches, she is also on higher dose of Topamax 100 mg twice a day, Gabapentin up to 600 mg twice a day did not help her headaches, or her bilateral lower extremity paresthesia pain, will change to Lyrica today, Previous laboratory showed elevated C reactive protein up to 20.9, her headache has much improved with prednisone, likely a component of inflammatory process  UPDATE Dec 28 2015:YY Her headache, and eye pain has much improved, with no recurrent headache after tapering off prednisone, she is now taking Topamax 100 mg twice a day, she is complaining of increased memory trouble, difficulty focusing, She lives by herself since M04/05/2011when her husband passed away, she graduated from high school, used to work as a sMuseum/gallery curator retired around age 77 she has strong family history of Alzheimer's disease, her maternal grandmother, mother, maternal aunt, sister suffered Alzheimer's disease  UPDATE May 8th 2017:YYI reviewed laboratory evaluation, she has elevated ESR of 50, previous elevated C reactive protein up to 21 in Nov 2016, frequent headaches, responded very well to prednison, she is off steroid now, has  no recurrent headache,   I have talked about potential temporal artery biopsy to rule out  temporal arteritis, she adamantly refused, she suffered severe keloid, with associated neuropathic pain, She is taking Topamax 50 mg twice a day, overall tolerating it well, she no longer has frequent migraine headaches, She complains of diffuse body achy pain multiple joints pain, neuropathic pain around her keloid, worsening scoliosis, and worsening gait difficulty We have personally reviewed MRI of the brain without contrast in April 2017, there was no significant abnormality UPDATE Nov 8th 2017:Ashley Hanson is accompanied by her daughter at today's clinical visit, she reported recent onset of left side headache, left eye achy pain, overall has improved, today's Mini-Mental status examination 29/30  We have personally reviewed MRI brain in February 2017, generalized atrophy mostly at the left parasylvian fissure, slight supratentorium small vessel disease. She is able to tolerate Namenda 10 mg twice a day, Cymbalta 60 mg well, she lives by herself, just sold her car, is in the process of get scat service, continue have diffuse body joints pain, gait abnormality  UPDATE  05/16/2018CM  [name] , 77 year old female returns for follow-up She has a history of mild cognitive impairment and her MMSE today is 28 out of 30. Last was 29 out of 30. She is currently on Namenda and Aricept without side effects. She occasionally has  word finding problems.  She denies any recent falls she does ambulate with a cane.  She has diffuse body joint pain. Previous history of right knee replacement needs left knee replacement.  She was placed on Cymbalta at her last visit with Dr.Yan however she is not taking the medication.She claims that her headaches have stopped no headaches in over 3 months , she has stopped her Topamax.  She returns for reevaluation  UPDATE 12/05/2017 CM Ashley Hanson, 77 year old female returns for follow-up history of mild cognitive impairment.  She occasionally has some word finding problems.  Her MMSE 26  today which is stable.  She is currently on Namenda twice daily and Aricept taking it at night.  She complains with occasional hallucinations has not slept well.  She was asked to take her Aricept in the morning.  She has not had any recent falls uses a single-point cane to ambulate she is no longer having headaches and stopped her Topamax he also has stopped her Cymbalta that she was taking for knee pain.  She says she is due to get a knee replacement in the summer.  She returns for reevaluation UPDATE 9/16/2019CM Ms. Tullius, 77 year old female returns for follow-up with history of mild cognitive impairment.  She had hip surgery June 28, 2018 right hip replacement.  She is currently receiving physical therapy in the home twice a week.  She is seated in a wheelchair she continues to have some word finding issues.  MMSE30/30.  She is currently on Aricept and Namenda without side effects.  No recent falls.  Denies any headaches. Scores 5/6 on Ron Parker index for ADLs. 5/8 on IADLs.   No new neurologic complaints she returns for reevaluation  REVIEW OF SYSTEMS: Full 14 system review of systems performed and notable only for those listed, all others are neg:  Constitutional: neg  Cardiovascular: neg Ear/Nose/Throat: neg  Skin: neg Eyes: Light sensitivity Respiratory: neg Gastroitestinal: neg  Hematology/Lymphatic: neg  Endocrine: neg Musculoskeletal:joint pain knee, right hip walking difficulty recent hip replacement on the right Allergy/Immunology: neg Neurological:  Occasional word finding difficulty , mild cognitive impairment Psychiatric:  depression  Sleep : neg   ALLERGIES: Allergies  Allergen Reactions  . Adhesive [Tape] Dermatitis    Severe irritation of skin  . Plavix [Clopidogrel Bisulfate] Itching and Other (See Comments)    Causes migraines. Itching all over the body.  . Warfarin And Related Other (See Comments)    Blood thinners - cause migraines in patient.    HOME  MEDICATIONS: Outpatient Medications Prior to Visit  Medication Sig Dispense Refill  . ALPRAZolam (XANAX) 0.25 MG tablet Take 0.25-0.5 mg by mouth See admin instructions. Take one tab in am, one tab in afternoon, and two tabs at bedtime.    Marland Kitchen aspirin EC 81 MG tablet Take 1 tablet (81 mg total) by mouth 2 (two) times daily. 60 tablet 0  . Calcium Carbonate-Vit D-Min (CALCIUM 1200 PO) Take 1 tablet by mouth 2 (two) times daily.    . Cholecalciferol (VITAMIN D-3) 1000 units CAPS Take 1 tablet by mouth daily.     Marland Kitchen diltiazem (CARDIZEM CD) 120 MG 24 hr capsule Take 120 mg by mouth daily.     Marland Kitchen donepezil (ARICEPT) 10 MG tablet TAKE 1 TABLET AT BEDTIME 90 tablet 3  . furosemide (LASIX) 40 MG tablet Take 120 mg by mouth 2 (two) times daily.     Marland Kitchen gabapentin (NEURONTIN) 600 MG tablet Take 600 mg by mouth 3 (three) times daily.    . hypromellose (SYSTANE OVERNIGHT THERAPY) 0.3 % GEL ophthalmic ointment Place 1 application into both eyes at bedtime.    Marland Kitchen losartan (COZAAR) 50 MG tablet Take 50 mg by mouth daily.    . memantine (NAMENDA) 10 MG tablet Take 1 tablet (10 mg total) by mouth 2 (two) times daily. 180 tablet 3  . Menthol, Topical Analgesic, (BIOFREEZE) 4 % GEL Apply 1 application topically 3 (three) times daily as needed (body pain).    . Multiple Vitamins-Minerals (PRESERVISION AREDS PO) Take 1 capsule by mouth 2 (two) times daily.     Marland Kitchen oxyCODONE-acetaminophen (PERCOCET/ROXICET) 5-325 MG tablet Take 1 tablet by mouth every 4 (four) hours as needed for severe pain. 30 tablet 0  . potassium chloride (K-DUR,KLOR-CON) 10 MEQ tablet Take 10 mEq by mouth daily.    . Probiotic Product (PROBIOTIC PO) Take 1 capsule by mouth daily.     . propranolol (INDERAL) 60 MG tablet Take 1 tablet (60 mg total) by mouth 2 (two) times daily. 30 tablet 3  . Propylene Glycol (SYSTANE COMPLETE) 0.6 % SOLN Place 1 drop into both eyes as needed.    . sodium chloride (MURO 128) 2 % ophthalmic solution Place 1 drop into both  eyes 4 (four) times daily.    Marland Kitchen tiZANidine (ZANAFLEX) 2 MG tablet Take 1 tablet (2 mg total) by mouth every 6 (six) hours as needed. 60 tablet 0   No facility-administered medications prior to visit.     PAST MEDICAL HISTORY: Past Medical History:  Diagnosis Date  . Anginal pain [facility])    sees [name]; reportedly negative stress/echo 2012  . Arthritis    "hands, elbows, shoulders, and back" (06/28/2018)  . Breast cancer, left breast ([place])   . Carpal tunnel syndrome    bilaterally  . Chronic lower back pain   . Complication of anesthesia    "difficutly waking up and hypothermia and stopped breathing"  . Confusion   . Dementia    "stage 4; w/both long term and short term memory loss" (06/28/2018)  . Depression   . Diverticulosis   . Esophageal reflux   .  Hypertension    sees [name]  . Hypothyroidism   . IBS (irritable bowel syndrome)   . IBS (irritable bowel syndrome)   . Melanoma ([place])    2nd digit right hand; bottom of left foot" (06/28/2018)  . Migraines    "stopped having them 3-4 years ago" (06/28/2018)  . Myocardial infarction [facility])    "a mini one; early 2000s maybe"  . Osteoporosis   . PONV (postoperative nausea and vomiting)   . Refusal of blood transfusions as patient is Jehovah's Witness   . Scoliosis     PAST SURGICAL HISTORY: Past Surgical History:  Procedure Laterality Date  . ANKLE SURGERY Left 10/2007   "fell at work; don't know what they did to it"  . BREAST BIOPSY Left "X 2"  . BREAST LUMPECTOMY Left   . BREAST SURGERY Left    "removed scar tissue"  . CARDIAC CATHETERIZATION  X 2  . CARDIOVASCULAR STRESS TEST    . CARPAL TUNNEL RELEASE Right 1995  . CARPAL TUNNEL RELEASE Left   . CATARACT EXTRACTION W/ INTRAOCULAR LENS  IMPLANT, BILATERAL    . CHOLECYSTECTOMY  10/04/2012   Procedure: LAPAROSCOPIC CHOLECYSTECTOMY WITH INTRAOPERATIVE CHOLANGIOGRAM;  Surgeon: Adin Hector, MD;  Location: [place];  Service: General;  Laterality: N/A;   laparosopic cholecystectomy with cholangiogram and repair of umbilical hernia  . COLONOSCOPY  08/30/11  . DILATION AND CURETTAGE OF UTERUS    . DOPPLER ECHOCARDIOGRAPHY     hx   . ERCP  10/05/2012   Procedure: ENDOSCOPIC RETROGRADE CHOLANGIOPANCREATOGRAPHY (ERCP);  Surgeon: Jeryl Columbia, MD;  Location[facility] ENDOSCOPY;  Service: Endoscopy;  Laterality: N/A;  . ESOPHAGOGASTRODUODENOSCOPY  08/30/11  . HERNIA REPAIR  10/02/2014   "in my stomach; took it out w/gallbladder"  . JOINT REPLACEMENT    . KELOID EXCISION Bilateral    "earlobes"  . KNEE ARTHROSCOPY Right 06/2007  . MELANOMA EXCISION Bilateral    2nd digit right hand; bottom of left foot" (06/28/2018)  . SHOULDER ARTHROSCOPY Right 06/2011  . SKIN GRAFT Right    "related to melanoma restricting my finger bending"  . TOTAL HIP ARTHROPLASTY Right 06/28/2018  . TOTAL HIP ARTHROPLASTY Right 06/28/2018   Procedure: RIGHT TOTAL HIP ARTHROPLASTY ANTERIOR APPROACH;  Surgeon: Frederik Pear, MD;  Location: [place];  Service: Orthopedics;  Laterality: Right;  . TOTAL KNEE ARTHROPLASTY Right 08/2008  . TUBAL LIGATION      FAMILY HISTORY: Family History  Problem Relation Age of Onset  . Colon polyps Mother   . Breast cancer Sister   . Dementia Sister   . Sleep apnea Daughter     SOCIAL HISTORY: Social History   Socioeconomic History  . Marital status: Widowed    Spouse name: Not on file  . Number of children: 2  . Years of education: 28  . Highest education level: Not on file  Occupational History  . 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 Financial resource strain: Not on file  . Food insecurity:    Worry: Not on file    Inability: Not on file  . Transportation needs:    Medical: Not on file    Non-medical: Not on file  Tobacco Use  . Smoking status: Never Smoker  . Smokeless tobacco: Never Used  Substance and Sexual Activity  . Alcohol use: Not Currently  . Drug use: Never  . Sexual activity: Not Currently   Lifestyle  . Physical activity:  Days per week: Not on file    Minutes per session: Not on file  . Stress: Not on file  Relationships  . Social connections:    Talks on phone: Not on file    Gets together: Not on file    Attends religious service: Not on file    Active member of club or organization: Not on file    Attends meetings of clubs or organizations: Not on file    Relationship status: Not on file  . Intimate partner violence:    Fear of current or ex partner: Not on file    Emotionally abused: Not on file    Physically abused: Not on file    Forced sexual activity: Not on file  Other Topics Concern  . Not on file  Social History Narrative   Patient lives at home with her father and he is blind. Patient is retired. Patient is widowed and has two children.   Right handed.   Caffeine- None     PHYSICAL EXAM  Vitals:   08/05/18 1309  BP: 124/60  Pulse: (!) 53  Weight: 227 lb (103 kg)  Height: 5' 5.5" (1.664 m)   Body mass index is 37.2 kg/m.  Generalized: Well developed,  Obese female in no acute distress  Head: normocephalic and atraumatic,. Oropharynx benign  Neck: Supple, Musculoskeletal: No deformity  Skin  Multiple keloids, 1+ pitting edema of ankles  Neurological examination   Mentation: Alert , AFT 17. Clock drawing 3/4 MMSE - Mini Mental State Exam 08/05/2018 12/05/2017 04/04/2017  Orientation to time _0 Orientation to Place _1 Registration _2 Attention/ Calculation _3 Recall _4 Language- name 2 objects _5 Language- repeat 1 0 1  Language- follow 3 step command _6 Language- read & follow direction _7 Write a sentence _8 Copy design _9 Total score _10 Follows all commands speech and language fluent.   Cranial nerve II-XII: .Pupils were equal round reactive to light extraocular movements were full, visual field were full on confrontational test. She has bilateral  conjunctiva erythematous.   Facial sensation and strength were normal. hearing was intact to finger rubbing bilaterally. Uvula tongue midline. head turning and shoulder shrug were normal and symmetric.Tongue protrusion into cheek strength was normal. Motor: normal bulk and tone, full strength in the BUE, BLE, except3-4 right lower extremity Sensory:  Length dependent decreased light touch, pinprick, and  Vibration,  To shins Coordination: finger-nose-finger, heel-to-shin bilaterally, no dysmetria Reflexes:  Symmetric upper and lower, plantar responses were flexor bilaterally. Gait and Station: In wheelchair not ambulated   DIAGNOSTIC DATA (LABS, IMAGING, TESTING) - I reviewed patient records, labs, notes, testing and imaging myself where available.  Lab Results  Component Value Date   WBC 12.5 (H) 07/01/2018   HGB 10.8 (L) 07/01/2018   HCT 35.2 (L) 07/01/2018   MCV 98.6 07/01/2018   PLT 192 07/01/2018      Component Value Date/Time   NA 139 06/29/2018 0409   NA 141 05/01/2018 1526   K 3.1 (L) 06/29/2018 0409   CL 105 06/29/2018 0409   CO2 25 06/29/2018 0409   GLUCOSE 134 (H) 06/29/2018 0409   BUN 11 06/29/2018 0409   BUN 15 05/01/2018 1526   CREATININE 1.72 (H) 06/29/2018 0409   CALCIUM 8.1 (L) 06/29/2018 0409  PROT 7.5 03/14/2016 2002   PROT 7.1 01/11/2016 1417   ALBUMIN 3.5 03/14/2016 2002   ALBUMIN 3.9 01/11/2016 1417   AST 24 03/14/2016 2002   ALT 21 03/14/2016 2002   ALKPHOS 131 (H) 03/14/2016 2002   BILITOT 0.5 03/14/2016 2002   BILITOT 0.3 01/11/2016 1417   GFRNONAA 27 (L) 06/29/2018 0409   GFRAA 32 (L) 06/29/2018 0409     ASSESSMENT AND PLAN  77 y.o. year old female  has a past medical history of  Refusal of blood transfusions as patient is Jehovah's Witness.  With diagnosis of  Bilateral glaucoma,  C-reactive protein elevated. Improved with prednisome. Pt refused temporal bx. Headaches are improved and no longer an issue Mild cognitive impairment, MMSE stable ,diffuse body aches and  multifactorial gait disorder here to follow-up                 PLAN:  Memory score is stable 30/30 continue Aricept and Namenda Continue PT recent right hip surgery be careful with ambulation Follow up 6 months Gait disordermultifactorial due to bilateral knee pain and back pain scoliosis left and right knee pain, right hip pain.  I spent 25 minutes in total face to face time with the patient/daughter more than 50% of which was spent counseling and coordination of care, reviewing test results reviewing medications and discussing and reviewing the diagnosis of headaches, mild cognitive impairment and gait disorder.  Follow a structured environment try not to multitask use a daily organizer.  Report any wandering behavior vivid dreaming or hallucinations [facility] [address] [place], [place] [address] [phone]

## 2018-08-05 ENCOUNTER — Ambulatory Visit (INDEPENDENT_AMBULATORY_CARE_PROVIDER_SITE_OTHER): Payer: Medicare Other | Admitting: Nurse Practitioner

## 2018-08-05 ENCOUNTER — Encounter: Payer: Self-pay | Admitting: Nurse Practitioner

## 2018-08-05 VITALS — BP 124/60 | HR 53 | Ht 65.5 in | Wt 227.0 lb

## 2018-08-05 DIAGNOSIS — R269 Unspecified abnormalities of gait and mobility: Secondary | ICD-10-CM

## 2018-08-05 DIAGNOSIS — G3184 Mild cognitive impairment, so stated: Secondary | ICD-10-CM

## 2018-08-05 DIAGNOSIS — I2510 Atherosclerotic heart disease of native coronary artery without angina pectoris: Secondary | ICD-10-CM

## 2018-08-05 MED ORDER — MEMANTINE HCL 10 MG PO TABS: 10.0000 mg | ORAL_TABLET | Freq: Two times a day (BID) | ORAL | 3 refills | Status: DC

## 2018-08-05 NOTE — Progress Notes (Signed)
I have reviewed and agreed above plan. 

## 2018-08-05 NOTE — Patient Instructions (Signed)
Memory score is stable 30/30 continue Aricept and Namenda Continue PT recent right hip surgery be careful with ambulation Follow up 6 months

## 2018-09-05 ENCOUNTER — Other Ambulatory Visit: Payer: Self-pay | Admitting: Internal Medicine

## 2018-09-05 DIAGNOSIS — Z1231 Encounter for screening mammogram for malignant neoplasm of breast: Secondary | ICD-10-CM

## 2018-11-18 ENCOUNTER — Ambulatory Visit
Admission: RE | Admit: 2018-11-18 | Discharge: 2018-11-18 | Disposition: A | Discharge status: Home / Self Care | Payer: Medicare Other | Source: Ambulatory Visit | Attending: Internal Medicine | Admitting: Internal Medicine

## 2018-11-18 DIAGNOSIS — Z1231 Encounter for screening mammogram for malignant neoplasm of breast: Secondary | ICD-10-CM

## 2018-11-21 ENCOUNTER — Emergency Department (HOSPITAL_COMMUNITY): Payer: Medicare Other

## 2018-11-21 ENCOUNTER — Emergency Department (HOSPITAL_COMMUNITY)
Admission: EM | Admit: 2018-11-21 | Discharge: 2018-11-21 | Disposition: A | Discharge status: Home / Self Care | Payer: Medicare Other | Attending: Emergency Medicine | Admitting: Emergency Medicine

## 2018-11-21 ENCOUNTER — Other Ambulatory Visit: Payer: Self-pay

## 2018-11-21 ENCOUNTER — Encounter (HOSPITAL_COMMUNITY): Payer: Self-pay

## 2018-11-21 DIAGNOSIS — Z96651 Presence of right artificial knee joint: Secondary | ICD-10-CM | POA: Insufficient documentation

## 2018-11-21 DIAGNOSIS — Z7982 Long term (current) use of aspirin: Secondary | ICD-10-CM | POA: Diagnosis not present

## 2018-11-21 DIAGNOSIS — I2510 Atherosclerotic heart disease of native coronary artery without angina pectoris: Secondary | ICD-10-CM | POA: Insufficient documentation

## 2018-11-21 DIAGNOSIS — E039 Hypothyroidism, unspecified: Secondary | ICD-10-CM | POA: Insufficient documentation

## 2018-11-21 DIAGNOSIS — F0390 Unspecified dementia without behavioral disturbance: Secondary | ICD-10-CM | POA: Insufficient documentation

## 2018-11-21 DIAGNOSIS — Z79899 Other long term (current) drug therapy: Secondary | ICD-10-CM | POA: Diagnosis not present

## 2018-11-21 DIAGNOSIS — R4182 Altered mental status, unspecified: Secondary | ICD-10-CM | POA: Diagnosis present

## 2018-11-21 DIAGNOSIS — I10 Essential (primary) hypertension: Secondary | ICD-10-CM | POA: Insufficient documentation

## 2018-11-21 DIAGNOSIS — Z96641 Presence of right artificial hip joint: Secondary | ICD-10-CM | POA: Insufficient documentation

## 2018-11-21 DIAGNOSIS — R404 Transient alteration of awareness: Secondary | ICD-10-CM | POA: Insufficient documentation

## 2018-11-21 LAB — COMPREHENSIVE METABOLIC PANEL
ALT: 30 U/L (ref 0–44)
AST: 31 U/L (ref 15–41)
Albumin: 3.6 g/dL (ref 3.5–5.0)
Alkaline Phosphatase: 150 U/L — ABNORMAL HIGH (ref 38–126)
Anion gap: 9 (ref 5–15)
BUN: 9 mg/dL (ref 8–23)
CO2: 25 mmol/L (ref 22–32)
Calcium: 9.5 mg/dL (ref 8.9–10.3)
Chloride: 109 mmol/L (ref 98–111)
Creatinine, Ser: 0.68 mg/dL (ref 0.44–1.00)
GFR calc Af Amer: >60 mL/min (ref >60)
GFR calc non Af Amer: >60 mL/min (ref >60)
Glucose, Bld: 98 mg/dL (ref 70–99)
Potassium: 3.7 mmol/L (ref 3.5–5.1)
Sodium: 143 mmol/L (ref 135–145)
Total Bilirubin: 0.6 mg/dL (ref 0.3–1.2)
Total Protein: 7.4 g/dL (ref 6.5–8.1)

## 2018-11-21 LAB — APTT: aPTT: 30 seconds (ref 24–36)

## 2018-11-21 LAB — DIFFERENTIAL
Abs Immature Granulocytes: 0.02 10*3/uL (ref 0.00–0.07)
Basophils Absolute: 0 10*3/uL (ref 0.0–0.1)
Basophils Relative: 0 %
Eosinophils Absolute: 0.4 10*3/uL (ref 0.0–0.5)
Eosinophils Relative: 6 %
Immature Granulocytes: 0 %
Lymphocytes Relative: 31 %
Lymphs Abs: 2.2 10*3/uL (ref 0.7–4.0)
Monocytes Absolute: 0.6 10*3/uL (ref 0.1–1.0)
Monocytes Relative: 9 %
Neutro Abs: 3.9 10*3/uL (ref 1.7–7.7)
Neutrophils Relative %: 54 %

## 2018-11-21 LAB — CBC
HCT: 42.1 % (ref 36.0–46.0)
Hemoglobin: 13.2 g/dL (ref 12.0–15.0)
MCH: 29.5 pg (ref 26.0–34.0)
MCHC: 31.4 g/dL (ref 30.0–36.0)
MCV: 94 fL (ref 80.0–100.0)
Platelets: 166 10*3/uL (ref 150–400)
RBC: 4.48 MIL/uL (ref 3.87–5.11)
RDW: 14.7 % (ref 11.5–15.5)
WBC: 7.2 10*3/uL (ref 4.0–10.5)
nRBC: 0 % (ref 0.0–0.2)

## 2018-11-21 LAB — ETHANOL: Alcohol, Ethyl (B): <10 mg/dL (ref <10)

## 2018-11-21 LAB — URINALYSIS, ROUTINE W REFLEX MICROSCOPIC
Bilirubin Urine: NEGATIVE
Glucose, UA: NEGATIVE mg/dL
Ketones, ur: NEGATIVE mg/dL
Nitrite: NEGATIVE
Protein, ur: NEGATIVE mg/dL
Specific Gravity, Urine: 1.005 (ref 1.005–1.030)
pH: 8 (ref 5.0–8.0)

## 2018-11-21 LAB — I-STAT CHEM 8, ED
BUN: 8 mg/dL (ref 8–23)
Calcium, Ion: 1.26 mmol/L (ref 1.15–1.40)
Chloride: 108 mmol/L (ref 98–111)
Creatinine, Ser: 0.7 mg/dL (ref 0.44–1.00)
Glucose, Bld: 96 mg/dL (ref 70–99)
HCT: 41 % (ref 36.0–46.0)
Hemoglobin: 13.9 g/dL (ref 12.0–15.0)
Potassium: 3.8 mmol/L (ref 3.5–5.1)
Sodium: 143 mmol/L (ref 135–145)
TCO2: 29 mmol/L (ref 22–32)

## 2018-11-21 LAB — RAPID URINE DRUG SCREEN, HOSP PERFORMED
Amphetamines: NOT DETECTED
Barbiturates: NOT DETECTED
Benzodiazepines: NOT DETECTED
Cocaine: NOT DETECTED
Opiates: NOT DETECTED
Tetrahydrocannabinol: NOT DETECTED

## 2018-11-21 LAB — I-STAT TROPONIN, ED: Troponin i, poc: 0 ng/mL (ref 0.00–0.08)

## 2018-11-21 LAB — PROTIME-INR
INR: 1.16
Prothrombin Time: 14.7 seconds (ref 11.4–15.2)

## 2018-11-21 MED ORDER — OXYCODONE-ACETAMINOPHEN 5-325 MG PO TABS
1.0000 | ORAL_TABLET | Freq: Once | ORAL | Status: AC
  Administered 2018-11-21: 1 via ORAL
  Filled 2018-11-21: qty 1

## 2018-11-21 NOTE — ED Notes (Signed)
Attempted an IV x 1 with no success. Will place an IV team consult.

## 2018-11-21 NOTE — ED Provider Notes (Signed)
MSE was initiated and I personally evaluated the patient and placed orders (if any) at  3:25 PM on November 21, 2018.  He presents by private vehicle for difficulty talking, or opening her eyes.  Patient states she was getting ready to take a nap when her granddaughter tried to arouse her.  Patient states that she could hear her granddaughter but could not open her eyes.  Her daughter was tapping on her leg, and face, but the patient could not open her eyes.  At that point the granddaughter texted her mother, the patient's daughter, and her mother arrived at the patient's bedside.  At that point the patient was alert and communicative, and the family decided to bring her here by private vehicle.  Screening exam-alert, lucid.  No dysarthria or aphasia.  No facial asymmetry.  Normal finger grip bilaterally.  Very minimal weakness right leg as compared to left.  No other weakness appreciated.  Screening labs and testing ordered.  The patient appears stable so that the remainder of the MSE may be completed by another provider.   Daleen Bo, MD 11/21/18 [phone]

## 2018-11-21 NOTE — Discharge Instructions (Addendum)
Follow up with your PCP and Neurologist. Return to ER for any new or concerning symptoms.

## 2018-11-21 NOTE — ED Provider Notes (Signed)
[place] DEPT Provider Note   CSN: [id number] Arrival date & time: 11/21/18  1510     History   Chief Complaint Chief Complaint  Patient presents with  . Altered Mental Status    HPI Ashley Hanson is a 78 y.o. female.  78yo female brought in by family at home for altered mental status. Patient states she was slumped over in her electric recliner, her granddaughter was leaving for work and saying goodbye, patient was unable to open her eyes- states felt like she had lead weights on her eyelids, was trying to tell her granddaughter she was not ok and to call for an ambulance but could only mumble the words, could not move her body. Granddaughter called her mom (patient's daughter) at 3:08PM to say something was wrong. Patient's daughter arrived at the house 10 minutes later and patient was back to baseline with no further symptoms or problems. Family was waiting for EMS but felt it was taking too long and drove patient to the ER themselves. Patient is ambulatory without difficulty- uses 2 canes to walk. No history of prior CVA or TIA. No complaints at this time.     Past Medical History:  Diagnosis Date  . Anginal pain [facility])    sees [name]; reportedly negative stress/echo 2012  . Arthritis    "hands, elbows, shoulders, and back" (06/28/2018)  . Breast cancer, left breast ([place])   . Carpal tunnel syndrome    bilaterally  . Chronic lower back pain   . Complication of anesthesia    "difficutly waking up and hypothermia and stopped breathing"  . Confusion   . Dementia ([place])    "stage 4; w/both long term and short term memory loss" (06/28/2018)  . Depression   . Diverticulosis   . Esophageal reflux   . Hypertension    sees [name]  . Hypothyroidism   . IBS (irritable bowel syndrome)   . IBS (irritable bowel syndrome)   . Melanoma ([place])    2nd digit right hand; bottom of left foot" (06/28/2018)  . Migraines    "stopped having them 3-4  years ago" (06/28/2018)  . Myocardial infarction [facility])    "a mini one; early 2000s maybe"  . Osteoporosis   . PONV (postoperative nausea and vomiting)   . Refusal of blood transfusions as patient is Jehovah's Witness   . Scoliosis     Patient Active Problem List   Diagnosis Date Noted  . Primary osteoarthritis of right hip 06/28/2018  . Osteoarthritis of left hip 06/25/2018  . Muscle pain 03/27/2016  . Mild cognitive impairment 12/28/2015  . Abnormal labor 11/01/2015  . Abnormality of gait 09/27/2015  . Chronic migraine 09/27/2015  . Lumbar back pain 11/24/2013  . Acquired scoliosis 07/03/2013  . Depression 06/10/2013  . Headache 06/10/2013  . Cervical dystonia 06/10/2013  . Choledocholithiasis with chronic cholecystitis 10/04/2012  . Cholelithiasis with acute or chronic cholecystitis 09/06/2012  . History of breast cancer in female 09/06/2012  . Hypertension 09/06/2012  . Coronary artery disease 09/06/2012    Past Surgical History:  Procedure Laterality Date  . ANKLE SURGERY Left 10/2007   "fell at work; don't know what they did to it"  . BREAST BIOPSY Left "X 2"  . BREAST LUMPECTOMY Left   . BREAST SURGERY Left    "removed scar tissue"  . CARDIAC CATHETERIZATION  X 2  . CARDIOVASCULAR STRESS TEST    . CARPAL TUNNEL RELEASE Right 1995  .  CARPAL TUNNEL RELEASE Left   . CATARACT EXTRACTION W/ INTRAOCULAR LENS  IMPLANT, BILATERAL    . CHOLECYSTECTOMY  10/04/2012   Procedure: LAPAROSCOPIC CHOLECYSTECTOMY WITH INTRAOPERATIVE CHOLANGIOGRAM;  Surgeon: Adin Hector, MD;  Location: [facility] [place];  Service: General;  Laterality: N/A;  laparosopic cholecystectomy with cholangiogram and repair of umbilical hernia  . COLONOSCOPY  08/30/11  . DILATION AND CURETTAGE OF UTERUS    . DOPPLER ECHOCARDIOGRAPHY     hx   . ERCP  10/05/2012   Procedure: ENDOSCOPIC RETROGRADE CHOLANGIOPANCREATOGRAPHY (ERCP);  Surgeon: Jeryl Columbia, MD;  Location: [facility] ENDOSCOPY;  Service: Endoscopy;  Laterality:  N/A;  . ESOPHAGOGASTRODUODENOSCOPY  08/30/11  . HERNIA REPAIR  10/02/2014   "in my stomach; took it out w/gallbladder"  . JOINT REPLACEMENT    . KELOID EXCISION Bilateral    "earlobes"  . KNEE ARTHROSCOPY Right 06/2007  . MELANOMA EXCISION Bilateral    2nd digit right hand; bottom of left foot" (06/28/2018)  . SHOULDER ARTHROSCOPY Right 06/2011  . SKIN GRAFT Right    "related to melanoma restricting my finger bending"  . TOTAL HIP ARTHROPLASTY Right 06/28/2018  . TOTAL HIP ARTHROPLASTY Right 06/28/2018   Procedure: RIGHT TOTAL HIP ARTHROPLASTY ANTERIOR APPROACH;  Surgeon: Frederik Pear, MD;  Location: [place];  Service: Orthopedics;  Laterality: Right;  . TOTAL KNEE ARTHROPLASTY Right 08/2008  . TUBAL LIGATION       OB History   No obstetric history on file.      Home Medications    Prior to Admission medications   Medication Sig Start Date End Date Taking? Authorizing Provider  aspirin EC 81 MG tablet Take 1 tablet (81 mg total) by mouth 2 (two) times daily. 06/28/18  Yes Leighton Parody, PA-C  Calcium Carbonate-Vit D-Min (CALCIUM 1200 PO) Take 1 tablet by mouth 2 (two) times daily.   Yes [provider]  Cholecalciferol (VITAMIN D-3) 1000 units CAPS Take 1 tablet by mouth daily.    Yes [provider]  diltiazem (CARDIZEM CD) 120 MG 24 hr capsule Take 120 mg by mouth daily.  05/13/14  Yes [provider]  docusate sodium (COLACE) 100 MG capsule Take 100 mg by mouth 2 (two) times daily.   Yes [provider]  donepezil (ARICEPT) 10 MG tablet TAKE 1 TABLET AT BEDTIME Patient taking differently: Take 10 mg by mouth every morning.  06/03/18  Yes Marcial Pacas, MD  furosemide (LASIX) 40 MG tablet Take 120 mg by mouth 2 (two) times daily.  09/18/15  Yes [provider]  gabapentin (NEURONTIN) 300 MG capsule Take 900 mg by mouth 3 (three) times daily.  06/16/18  Yes [provider]  hypromellose (SYSTANE OVERNIGHT THERAPY) 0.3 % GEL ophthalmic  ointment Place 1 application into both eyes at bedtime.   Yes [provider]  levothyroxine (SYNTHROID, LEVOTHROID) 50 MCG tablet Take 50 mcg by mouth daily before breakfast.   Yes [provider]  losartan (COZAAR) 100 MG tablet Take 100 mg by mouth daily.    Yes [provider]  memantine (NAMENDA) 10 MG tablet Take 1 tablet (10 mg total) by mouth 2 (two) times daily. 08/05/18  Yes Dennie Bible, NP  Multiple Vitamins-Minerals (PRESERVISION AREDS PO) Take 1 capsule by mouth 2 (two) times daily.    Yes [provider]  potassium chloride (K-DUR,KLOR-CON) 10 MEQ tablet Take 10 mEq by mouth daily.   Yes [provider]  propranolol (INDERAL) 80 MG tablet Take 80 mg  by mouth 2 (two) times daily.   Yes [provider]  sodium chloride (MURO 128) 5 % ophthalmic solution Place 1 drop into both eyes 4 (four) times daily.    Yes [provider]  oxyCODONE-acetaminophen (PERCOCET/ROXICET) 5-325 MG tablet Take 1 tablet by mouth every 4 (four) hours as needed for severe pain. Patient not taking: Reported on 11/21/2018 06/28/18   Leighton Parody, PA-C  propranolol (INDERAL) 60 MG tablet Take 1 tablet (60 mg total) by mouth 2 (two) times daily. Patient not taking: Reported on 11/21/2018 05/01/18   Lendon Colonel, NP  tiZANidine (ZANAFLEX) 2 MG tablet Take 1 tablet (2 mg total) by mouth every 6 (six) hours as needed. Patient not taking: Reported on 11/21/2018 06/28/18   Leighton Parody, PA-C    Family History Family History  Problem Relation Age of Onset  . Colon polyps Mother   . Breast cancer Sister   . Dementia Sister   . Sleep apnea Daughter     Social History Social History   Tobacco Use  . Smoking status: Never Smoker  . Smokeless tobacco: Never Used  Substance Use Topics  . Alcohol use: Not Currently  . Drug use: Never     Allergies   Adhesive [tape]; Plavix [clopidogrel bisulfate]; and Warfarin and related   Review  of Systems Review of Systems  Constitutional: Negative for chills and fever.  Respiratory: Negative for shortness of breath.   Cardiovascular: Negative for chest pain.  Gastrointestinal: Negative for abdominal pain, nausea and vomiting.  Genitourinary: Negative for dysuria, frequency and urgency.  Skin: Negative for rash and wound.  Neurological: Positive for speech difficulty and weakness.  Psychiatric/Behavioral: Negative for confusion.  All other systems reviewed and are negative.    Physical Exam Updated Vital Signs BP 138/80   Pulse (!) 56   Temp 98.2 F (36.8 C) (Oral)   Resp 12   Ht 5\' 5"  (1.651 m)   Wt 111.6 kg   SpO2 100%   BMI 40.94 kg/m   Physical Exam Vitals signs and nursing note reviewed.  Constitutional:      Appearance: Normal appearance. She is obese.  HENT:     Head: Normocephalic and atraumatic.     Mouth/Throat:     Mouth: Mucous membranes are moist.  Eyes:     Extraocular Movements: Extraocular movements intact.     Pupils: Pupils are equal, round, and reactive to light.  Neck:     Musculoskeletal: Neck supple.  Cardiovascular:     Rate and Rhythm: Normal rate and regular rhythm.     Pulses: Normal pulses.     Heart sounds: Normal heart sounds. No murmur.  Pulmonary:     Effort: Pulmonary effort is normal.     Breath sounds: Normal breath sounds.  Abdominal:     Tenderness: There is no abdominal tenderness.  Skin:    General: Skin is warm and dry.     Findings: No erythema or rash.  Neurological:     General: No focal deficit present.     Mental Status: She is alert and oriented to person, place, and time.     GCS: GCS eye subscore is 4. GCS verbal subscore is 5. GCS motor subscore is 6.     Cranial Nerves: No cranial nerve deficit.     Sensory: No sensory deficit.     Motor: Motor function is intact. No weakness, seizure activity or pronator drift.  Psychiatric:  Mood and Affect: Mood normal.        Behavior: Behavior normal.       ED Treatments / Results  Labs (all labs ordered are listed, but only abnormal results are displayed) Labs Reviewed  COMPREHENSIVE METABOLIC PANEL - Abnormal; Notable for the following components:      Result Value   Alkaline Phosphatase 150 (*)    All other components within normal limits  URINALYSIS, ROUTINE W REFLEX MICROSCOPIC - Abnormal; Notable for the following components:   Color, Urine STRAW (*)    Hgb urine dipstick SMALL (*)    Leukocytes, UA TRACE (*)    Bacteria, UA RARE (*)    All other components within normal limits  ETHANOL  PROTIME-INR  APTT  CBC  DIFFERENTIAL  RAPID URINE DRUG SCREEN, HOSP PERFORMED  I-STAT CHEM 8, ED  I-STAT TROPONIN, ED    EKG None  Radiology Dg Lumbar Spine Complete  Result Date: 11/21/2018 CLINICAL DATA:  Left greater than right back pain EXAM: LUMBAR SPINE - COMPLETE 4+ VIEW COMPARISON:  CT 07/23/2012 FINDINGS: Surgical clips in the right upper quadrant. Scoliosis of the spine, apex to the right. Grade 1 anterolisthesis L4 on L5. Vertebral body heights are maintained. Multiple level moderate to marked diffuse degenerative changes of the lumbar spine, most marked at L2-L3, L3-L4 and L5-S1. Posterior facet degenerative change at multiple levels. IMPRESSION: Dextroscoliosis of the lumbar spine with multiple level moderate to marked degenerative changes. No acute osseous abnormality. Electronically Signed   By: Donavan Foil M.D.   On: 11/21/2018 20:21   Dg Pelvis 1-2 Views  Result Date: 11/21/2018 CLINICAL DATA:  Left greater than right back pain EXAM: PELVIS - 1-2 VIEW COMPARISON:  CT 07/23/2012 FINDINGS: Status post right hip replacement with normal alignment. Pubic symphysis and rami are intact. SI joint degenerative changes. Advanced arthritis of the left hip with joint space narrowing and prominent osteophytes. IMPRESSION: 1. Right hip replacement with normal alignment 2. No acute osseous abnormality 3. Advanced arthritis of the left  hip Electronically Signed   By: Donavan Foil M.D.   On: 11/21/2018 20:19   Ct Head Wo Contrast  Result Date: 11/21/2018 CLINICAL DATA:  Altered mental status difficulty waking up EXAM: CT HEAD WITHOUT CONTRAST TECHNIQUE: Contiguous axial images were obtained from the base of the skull through the vertex without intravenous contrast. COMPARISON:  MRI 01/04/2016, CT brain 07/30/2010 FINDINGS: Brain: No acute territorial infarction, hemorrhage or intracranial mass. Prominent parafalcine calcification. Mild to moderate atrophy. Stable ventricle size Vascular: No hyperdense vessels. Scattered calcifications at the carotid siphons Skull: Normal. Negative for fracture or focal lesion. Sinuses/Orbits: No acute finding. Other: None IMPRESSION: 1. No CT evidence for acute intracranial abnormality. 2. Atrophy Electronically Signed   By: Donavan Foil M.D.   On: 11/21/2018 17:09   Dg Chest Port 1 View  Result Date: 11/21/2018 CLINICAL DATA:  Weakness EXAM: PORTABLE CHEST 1 VIEW COMPARISON:  06/18/2018 FINDINGS: Normal heart size when accounting for low volumes. Negative mediastinal contours. There is no edema, consolidation, effusion, or pneumothorax. Postoperative left axilla. No acute osseous finding. IMPRESSION: No evidence of active disease. Electronically Signed   By: Monte Fantasia M.D.   On: 11/21/2018 16:55    Procedures Procedures (including critical care time)  Medications Ordered in ED Medications  oxyCODONE-acetaminophen (PERCOCET/ROXICET) 5-325 MG per tablet 1 tablet (1 tablet Oral Given 11/21/18 2128)     Initial Impression / Assessment and Plan / ED Course  I have reviewed the  triage vital signs and the nursing notes.  Pertinent labs & imaging results that were available during my care of the patient were reviewed by me and considered in my medical decision making (see chart for details).  Clinical Course as of Nov 21 2148  Nicoletta Ba 78yo female presents to the ER for episode  today where she was slumped over in her recliner, unable to open her eyes and mumbling to her granddaughter trying to say that she was not ok and to call 911, unable to move her body. Episode lasted 10 minutes and completely resolved. ER evaluation today is unrearkable including CT head, CXR, CBC, CMP, UA, UDS, UA, etoh, trop. Patient evaluated by Dr. Darl Householder who discussed case with neurohospitalist. Patient to follow up with her neurologist and PCP, return to ER for any further symptoms or concerns. DDX considered TIA vs seizure.   [LM]    Clinical Course User Index [LM] Tacy Learn, PA-C   Final Clinical Impressions(s) / ED Diagnoses   Final diagnoses:  Transient alteration of awareness    ED Discharge Orders    None       Roque Lias 11/21/18 2151    Drenda Freeze, MD 11/23/18 [phone]

## 2018-11-21 NOTE — ED Notes (Signed)
Lab Phlebotomy called for blood draw. IV Team was unable to obtain blood when IV started.

## 2018-11-21 NOTE — ED Notes (Signed)
Bed: IX18 Expected date:  Expected time:  Means of arrival:  Comments: Hold for RES b

## 2018-11-21 NOTE — ED Notes (Signed)
Patient ambulated to bed side commode

## 2018-11-21 NOTE — ED Triage Notes (Signed)
Patient states her granddaughter tried to wake her up at her house and patient could her talk,but could not wake up. Patient states she eventually woke up and granddaughter called her daughter who brought her into the ED. Neuro/Stroke negative. Patient recalls most of the event.

## 2018-12-16 ENCOUNTER — Encounter: Payer: Self-pay | Admitting: Neurology

## 2018-12-16 ENCOUNTER — Ambulatory Visit (INDEPENDENT_AMBULATORY_CARE_PROVIDER_SITE_OTHER): Payer: Medicare Other | Admitting: Neurology

## 2018-12-16 ENCOUNTER — Telehealth: Payer: Self-pay | Admitting: Neurology

## 2018-12-16 DIAGNOSIS — G459 Transient cerebral ischemic attack, unspecified: Secondary | ICD-10-CM | POA: Diagnosis not present

## 2018-12-16 NOTE — Telephone Encounter (Addendum)
error 

## 2018-12-16 NOTE — Progress Notes (Signed)
PATIENT: Ashley Hanson DOB: [birth year]  REASON FOR VISIT: ED follow-up visit HISTORY FROM: Patient and her daughter   HISTORY OF PRESENT ILLNESS: Today 12/16/18:  I have reviewed and summarized the ED visit and testing from 11/21/2018.  Ashley Hanson is a 78 year old female with history of dementia who presented to the ED on 11/21/2018 for altered mental status. She was brought via car after her granddaughter reports the patient had an episode where she couldn't open her eyes or get her words out lasting about 10 minutes. Apparently she was asleep in her chair, her granddaughter asked her a question and then noticed that the patient was slumped over in her chair and mumbling. She remembers trying to get her words out, but she couldn't. Finally, she opened her eyes and her speech came back. When she came to, she had a headache. She stood up and got herself dressed. Her daughter then drove her to the ED The patient has good memory of this episode, specially feeling frustrated that she couldn't get her words out. She reports the day this episode occurred was a normal day and that she was feeling at her baseline. There was no weakness or facial droop. She does report since then the right side of her face has "felt weird", only one time, but then subsided.  She reports last week, she was sitting in her chair. She felt a huge pain to middle of face "Hanson it split her face". Then it went away. She reports her right eye has been leaking large drops of water, not very often. She has ringing in her ears and pain down the right side of her face. She reports having this right sided facial pain previously.   Memory, sometimes has good days and bad days. She notices sometimes she forgets what she wants to say. She is taking the Aricept and Namenda without side effect. She lives with her daughter. She presents today for ED follow-up.    HISTORY OF PRESENT ILLNESS  08/05/2018 Ashley Done, NP Ashley Hanson a 78 years  old right-handed female, last clinical visit was with Ashley Hanson August 2015 previously a patient of Ashley Hanson  She had a history of chronic ocular migraine the past, presented with colorful visual distortion, oftentimes followed by retro-orbital area headaches, when she was younger she has headaches frequently, sometimes the point of passing out, her headache has much improved, especially while take gabapentin 600 mg twice a day, since September 2016, she began to have frequent almost daily pressure behind both eyes, also complains of decreased vision, she has tried over-the-counter ibuprofen Tylenol did not work for her,  She had a history of macular degeneration with decreased vision to begin with, also had bilateral cataract surgery, recent diagnosis of glaucoma by ophthalmologist Ashley Hanson She complains of many years history of gradual onset memory trouble, she retired as a Therapist, occupational, she still able to drive, lives in apartment by herself, manage her whole own finances, Gait difficulty due to bilateral feet pain, scoliosis, chronic low back pain.  UPDATE Nov 01 2015:YYI reviewed a note from her ophthalmologist Ashley Hanson dated October 28 2015, patient complains of visual distortions of shapes and sizes, movement of visual imagings, Funduscopy examination showed normal appearance of optic nerve with healthy pink cream and normal appearance nerve fiber layers, macular showed dry age-related macular degeneration, vessels are normal, vitreous was normal, The conclusion was suspected glaucoma, OD was 12, OS was 14, reported that her father had a  history of glaucoma She was put on 3 eye drops, she has not started yet, she also had bilateral conjunctiva erythematous change, was diagnosed with recurrent iridocystitis Previous laboratory evaluation showed elevated C reactive protein of 21, with normal ESR, She wear sunglasses, she complains of light sensitive, she continues both  retro-orbital area pressure pain, constant 9/10, x24 hours. Topamax '100mg'$  bid did not help her headaches  UPDATE Nov 18 2015:YY Her headache and conjunctiva erythematous has much improved, everytime she took Fioricet, she had GI side effect, feel sick, band Hanson sensation. She was started on prednisone since December twelfth 2016, started from 20 mg daily, now 10 milligrams daily, she can tolerate the medication well, which has helped her headaches, she is also on higher dose of Topamax 100 mg twice a day, Gabapentin up to 600 mg twice a day did not help her headaches, or her bilateral lower extremity paresthesia pain, will change to Lyrica today, Previous laboratory showed elevated C reactive protein up to 20.9, her headache has much improved with prednisone, likely a component of inflammatory process  UPDATE Dec 28 2015:YY Her headache, and eye pain has much improved, with no recurrent headache after tapering off prednisone, she is now taking Topamax 100 mg twice a day, she is complaining of increased memory trouble, difficulty focusing, She lives by herself since 02-13-10 when her husband passed away, she graduated from high school, used to work as a Museum/gallery curator, retired around age 44, she has strong family history of Alzheimer's disease, her maternal grandmother, mother, maternal aunt, sister suffered Alzheimer's disease  UPDATE May 8th 2017:YYI reviewed laboratory evaluation, she has elevated ESR of 50, previous elevated C reactive protein up to 21 in Nov 2016, frequent headaches, responded very well to prednison, she is off steroid now, has no recurrent headache,   I have talked about potential temporal artery biopsy to rule out temporal arteritis, she adamantly refused, she suffered severe keloid, with associated neuropathic pain, She is taking Topamax 50 mg twice a day, overall tolerating it well, she no longer has frequent migraine headaches, She complains of diffuse body achy pain  multiple joints pain, neuropathic pain around her keloid, worsening scoliosis, and worsening gait difficulty We have personally reviewed MRI of the brain without contrast in April 2017, there was no significant abnormality UPDATE Nov 8th 2017:[name] is accompanied by her daughter at today's clinical visit, she reported recent onset of left side headache, left eye achy pain, overall has improved, today's Mini-Mental status examination 29/30  We have personally reviewed MRI brain in February 2017, generalized atrophy mostly at the left parasylvian fissure, slight supratentorium small vessel disease. She is able to tolerate Namenda 10 mg twice a day, Cymbalta 60 mg well, she lives by herself, just sold her car, is in the process of get scat service, continue have diffuse body joints pain, gait abnormality  UPDATE  05/16/2018CM  Ms. Govan , 78 year old female returns for follow-up She has a history of mild cognitive impairment and her MMSE today is 28 out of 30. Last was 29 out of 30. She is currently on Namenda and Aricept without side effects. She occasionally has  word finding problems.  She denies any recent falls she does ambulate with a cane.  She has diffuse body joint pain. Previous history of right knee replacement needs left knee replacement.  She was placed on Cymbalta at her last visit with AshleyYan however she is not taking the medication.She claims that her headaches have stopped  no headaches in over 3 months , she has stopped her Topamax.  She returns for reevaluation  UPDATE 12/05/2017 CM Ms. Callejo, 78 year old female returns for follow-up history of mild cognitive impairment.  She occasionally has some word finding problems.  Her MMSE 26 today which is stable.  She is currently on Namenda twice daily and Aricept taking it at night.  She complains with occasional hallucinations has not slept well.  She was asked to take her Aricept in the morning.  She has not had any recent falls uses a  single-point cane to ambulate she is no longer having headaches and stopped her Topamax he also has stopped her Cymbalta that she was taking for knee pain.  She says she is due to get a knee replacement in the summer.  She returns for reevaluation UPDATE 9/16/2019CM Ms. Mailloux, 78 year old female returns for follow-up with history of mild cognitive impairment.  She had hip surgery June 28, 2018 right hip replacement.  She is currently receiving physical therapy in the home twice a week.  She is seated in a wheelchair she continues to have some word finding issues.  MMSE30/30.  She is currently on Aricept and Namenda without side effects.  No recent falls.  Denies any headaches. Scores 5/6 on Ron Parker index for ADLs. 5/8 on IADLs.   No new neurologic complaints she returns for reevaluation  REVIEW OF SYSTEMS: Out of a complete 14 system review of symptoms, the patient complains only of the following symptoms, and all other reviewed systems are negative.  Blurred vision, chest pain, swelling in legs, hearing loss, ringing in ears, rash, moles, cough, diarrhea, easy bruising, easy bleeding, feeling hot, feeling cold, joint pain, joint swelling, cramps, aching muscles, allergies, skin sensitivity, memory loss, slurred speech seizure, not enough sleep, decreased energy, disinterest in activity, insomnia.  ALLERGIES: Allergies  Allergen Reactions  . Adhesive [Tape] Dermatitis    Severe irritation of skin  . Plavix [Clopidogrel Bisulfate] Itching and Other (See Comments)    Causes migraines. Itching all over the body.  . Warfarin And Related Other (See Comments)    Blood thinners - cause migraines in patient.    HOME MEDICATIONS: Outpatient Medications Prior to Visit  Medication Sig Dispense Refill  . aspirin EC 81 MG tablet Take 1 tablet (81 mg total) by mouth 2 (two) times daily. 60 tablet 0  . Calcium Carbonate-Vit D-Min (CALCIUM 1200 PO) Take 1 tablet by mouth 2 (two) times daily.    .  Cholecalciferol (VITAMIN D-3) 1000 units CAPS Take 1 tablet by mouth daily.     Marland Kitchen diltiazem (CARDIZEM CD) 120 MG 24 hr capsule Take 120 mg by mouth daily.     Marland Kitchen docusate sodium (COLACE) 100 MG capsule Take 100 mg by mouth 2 (two) times daily.    Marland Kitchen donepezil (ARICEPT) 10 MG tablet TAKE 1 TABLET AT BEDTIME (Patient taking differently: Take 10 mg by mouth every morning. ) 90 tablet 3  . furosemide (LASIX) 40 MG tablet Take 120 mg by mouth 2 (two) times daily.     Marland Kitchen gabapentin (NEURONTIN) 300 MG capsule Take 900 mg by mouth 3 (three) times daily.     . hypromellose (SYSTANE OVERNIGHT THERAPY) 0.3 % GEL ophthalmic ointment Place 1 application into both eyes at bedtime.    Marland Kitchen levothyroxine (SYNTHROID, LEVOTHROID) 50 MCG tablet Take 50 mcg by mouth daily before breakfast.    . losartan (COZAAR) 100 MG tablet Take 100 mg by mouth daily.     Marland Kitchen  memantine (NAMENDA) 10 MG tablet Take 1 tablet (10 mg total) by mouth 2 (two) times daily. 180 tablet 3  . Multiple Vitamins-Minerals (PRESERVISION AREDS PO) Take 1 capsule by mouth 2 (two) times daily.     Marland Kitchen oxyCODONE-acetaminophen (PERCOCET/ROXICET) 5-325 MG tablet Take 1 tablet by mouth every 4 (four) hours as needed for severe pain. 30 tablet 0  . potassium chloride (K-DUR,KLOR-CON) 10 MEQ tablet Take 10 mEq by mouth daily.    . propranolol (INDERAL) 60 MG tablet Take 1 tablet (60 mg total) by mouth 2 (two) times daily. 30 tablet 3  . propranolol (INDERAL) 80 MG tablet Take 80 mg by mouth 2 (two) times daily.    . sodium chloride (MURO 128) 5 % ophthalmic solution Place 1 drop into both eyes 4 (four) times daily.     Marland Kitchen tiZANidine (ZANAFLEX) 2 MG tablet Take 1 tablet (2 mg total) by mouth every 6 (six) hours as needed. 60 tablet 0   No facility-administered medications prior to visit.     PAST MEDICAL HISTORY: Past Medical History:  Diagnosis Date  . Anginal pain [facility])    sees [name]; reportedly negative stress/echo 2012  . Arthritis    "hands,  elbows, shoulders, and back" (06/28/2018)  . Breast cancer, left breast ([place])   . Carpal tunnel syndrome    bilaterally  . Chronic lower back pain   . Complication of anesthesia    "difficutly waking up and hypothermia and stopped breathing"  . Confusion   . Dementia ([place])    "stage 4; w/both long term and short term memory loss" (06/28/2018)  . Depression   . Diverticulosis   . Esophageal reflux   . Hypertension    sees [name]  . Hypothyroidism   . IBS (irritable bowel syndrome)   . IBS (irritable bowel syndrome)   . Melanoma ([place])    2nd digit right hand; bottom of left foot" (06/28/2018)  . Migraines    "stopped having them 3-4 years ago" (06/28/2018)  . Myocardial infarction [facility])    "a mini one; early 2000s maybe"  . Osteoporosis   . PONV (postoperative nausea and vomiting)   . Refusal of blood transfusions as patient is Jehovah's Witness   . Scoliosis     PAST SURGICAL HISTORY: Past Surgical History:  Procedure Laterality Date  . ANKLE SURGERY Left 10/2007   "fell at work; don't know what they did to it"  . BREAST BIOPSY Left "X 2"  . BREAST LUMPECTOMY Left   . BREAST SURGERY Left    "removed scar tissue"  . CARDIAC CATHETERIZATION  X 2  . CARDIOVASCULAR STRESS TEST    . CARPAL TUNNEL RELEASE Right 1995  . CARPAL TUNNEL RELEASE Left   . CATARACT EXTRACTION W/ INTRAOCULAR LENS  IMPLANT, BILATERAL    . CHOLECYSTECTOMY  10/04/2012   Procedure: LAPAROSCOPIC CHOLECYSTECTOMY WITH INTRAOPERATIVE CHOLANGIOGRAM;  Surgeon: Adin Hector, MD;  Location: [place];  Service: General;  Laterality: N/A;  laparosopic cholecystectomy with cholangiogram and repair of umbilical hernia  . COLONOSCOPY  08/30/11  . DILATION AND CURETTAGE OF UTERUS    . DOPPLER ECHOCARDIOGRAPHY     hx   . ERCP  10/05/2012   Procedure: ENDOSCOPIC RETROGRADE CHOLANGIOPANCREATOGRAPHY (ERCP);  Surgeon: Jeryl Columbia, MD;  [facility] ENDOSCOPY;  Service: Endoscopy;  Laterality: N/A;  .  ESOPHAGOGASTRODUODENOSCOPY  08/30/11  . HERNIA REPAIR  10/02/2014   "in my stomach; took it out w/gallbladder"  . JOINT REPLACEMENT    .  KELOID EXCISION Bilateral    "earlobes"  . KNEE ARTHROSCOPY Right 06/2007  . MELANOMA EXCISION Bilateral    2nd digit right hand; bottom of left foot" (06/28/2018)  . SHOULDER ARTHROSCOPY Right 06/2011  . SKIN GRAFT Right    "related to melanoma restricting my finger bending"  . TOTAL HIP ARTHROPLASTY Right 06/28/2018  . TOTAL HIP ARTHROPLASTY Right 06/28/2018   Procedure: RIGHT TOTAL HIP ARTHROPLASTY ANTERIOR APPROACH;  Surgeon: Frederik Pear, MD;  Location: [facility];  Service: Orthopedics;  Laterality: Right;  . TOTAL KNEE ARTHROPLASTY Right 08/2008  . TUBAL LIGATION      FAMILY HISTORY: Family History  Problem Relation Age of Onset  . Colon polyps Mother   . Breast cancer Sister   . Dementia Sister   . Sleep apnea Daughter     SOCIAL HISTORY: Social History   Socioeconomic History  . Marital status: Widowed    Spouse name: Not on file  . Number of children: 2  . Years of education: 21  . Highest education level: Not on file  Occupational History  . 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 Financial resource strain: Not on file  . Food insecurity:    Worry: Not on file    Inability: Not on file  . Transportation needs:    Medical: Not on file    Non-medical: Not on file  Tobacco Use  . Smoking status: Never Smoker  . Smokeless tobacco: Never Used  Substance and Sexual Activity  . Alcohol use: Not Currently  . Drug use: Never  . Sexual activity: Not Currently  Lifestyle  . Physical activity:    Days per week: Not on file    Minutes per session: Not on file  . Stress: Not on file  Relationships  . Social connections:    Talks on phone: Not on file    Gets together: Not on file    Attends religious service: Not on file    Active member of club or organization: Not on file    Attends meetings of clubs or  organizations: Not on file    Relationship status: Not on file  . Intimate partner violence:    Fear of current or ex partner: Not on file    Emotionally abused: Not on file    Physically abused: Not on file    Forced sexual activity: Not on file  Other Topics Concern  . Not on file  Social History Narrative   Patient lives at home with her father and he is blind. Patient is retired. Patient is widowed and has two children.   Right handed.   Caffeine- None      PHYSICAL EXAM  Vitals:   12/16/18 1350  BP: 138/70  Pulse: 60  Weight: 233 lb (105.7 kg)  Height: '5\' 5"'$  (1.651 m)   Body mass index is 38.77 kg/m.  Generalized: Well developed, in no acute distress, using 2 canes today. Swelling to bilateral lower legs, 2+ pitting edema.  Neurological examination  Mentation: Alert oriented to time, place, history taking. Follows all commands speech and language fluent Cranial nerve II-XII: Pupils were equal round reactive to light. Extraocular movements were full, visual field were full on confrontational test. Facial sensation and strength were normal. Uvula tongue midline. Head turning and shoulder shrug  were normal and symmetric. Motor: The motor testing reveals 5 over 5 strength of all 4 extremities. Good symmetric motor tone is noted throughout.  Sensory: Sensory  testing is intact to soft touch on all 4 extremities. No evidence of extinction is noted.  Coordination: Cerebellar testing reveals good finger-nose-finger bilaterally. Unable to perform heel-to-shin due to swelling in both legs and limited ROM. Gait and station: Gait is slow, unsteady. Tandem gait not attempted due to patient request.. Romberg is negative. No drift is seen.  Reflexes: Deep tendon reflexes are symmetric, but decreased to legs bilaterally.   DIAGNOSTIC DATA (LABS, IMAGING, TESTING) - I reviewed patient records, labs, notes, testing and imaging myself where available.  Lab Results  Component Value Date     WBC 7.2 11/21/2018   HGB 13.9 11/21/2018   HCT 41.0 11/21/2018   MCV 94.0 11/21/2018   PLT 166 11/21/2018      Component Value Date/Time   NA 143 11/21/2018 1841   NA 141 05/01/2018 1526   K 3.8 11/21/2018 1841   CL 108 11/21/2018 1841   CO2 25 11/21/2018 1826   GLUCOSE 96 11/21/2018 1841   BUN 8 11/21/2018 1841   BUN 15 05/01/2018 1526   CREATININE 0.70 11/21/2018 1841   CALCIUM 9.5 11/21/2018 1826   PROT 7.4 11/21/2018 1826   PROT 7.1 01/11/2016 1417   ALBUMIN 3.6 11/21/2018 1826   ALBUMIN 3.9 01/11/2016 1417   AST 31 11/21/2018 1826   ALT 30 11/21/2018 1826   ALKPHOS 150 (H) 11/21/2018 1826   BILITOT 0.6 11/21/2018 1826   BILITOT 0.3 01/11/2016 1417   GFRNONAA >60 11/21/2018 1826   GFRAA >60 11/21/2018 1826   No results found for: CHOL, HDL, LDLCALC, LDLDIRECT, TRIG, CHOLHDL No results found for: HGBA1C Lab Results  Component Value Date   VITAMINB12 980 (H) 01/11/2016   Lab Results  Component Value Date   TSH 5.070 (H) 05/01/2018     CT Head 11/21/2018: IMPRESSION: 1. No CT evidence for acute intracranial abnormality. 2. Atrophy  ASSESSMENT AND PLAN 78 y.o. year old female  1. TIA 2. Mild cognitive impairment   Concerned the episode she experienced on 11/21/2018 may have been a TIA. The episode was brief, lasting 10 minutes. There was no loss of consciousness. It sounds as if she was exhibiting expressive aphasia during that time. By the time she presented to the ED, her symptoms had resolved. CT head was Hanson in the ED, showing atrophy, but no evidence for acute intracranial abnormality. She is already taking two 81 mg aspirin tablets daily. We will order MRI of the brain, EEG, Echo, and carotid US. She will continue taking aspirin.   In the past she has refused temporal artery biopsy to rule out temporal arteritis due to severe pain surround her keloid. She has had elevated CRP and ESR in the past. She has previous diagnosis of bilateral glaucoma, likely  component of inflammatory changes. Her headaches are much improved.   She reports her memory is at baseline. She has a strong family history of Alzheimer's Disease. She has some good days and some bad. Her last MMSE was 30/30 08/05/18. She notices her memory most when she is talking to someone and she finds that she will forget what she was saying. She continues to take Aricept and Namenda and is tolerating these medications well.   She will follow-up in 3 months or sooner if needed. We will review her results at the next visit.   I saw patient together with NP Heliodoro Domagalski, reviewed CT head, lab result.  Butler Denmark, AGNP-C, DNP 12/16/2018, 2:10 PM [facility] [address]  [place], [place] [address] [phone]

## 2018-12-17 ENCOUNTER — Other Ambulatory Visit: Payer: Self-pay | Admitting: *Deleted

## 2018-12-17 ENCOUNTER — Telehealth: Payer: Self-pay | Admitting: Neurology

## 2018-12-17 DIAGNOSIS — G459 Transient cerebral ischemic attack, unspecified: Secondary | ICD-10-CM

## 2018-12-17 NOTE — Telephone Encounter (Signed)
Patient is scheduled to have her Carotid and Echo done on 12/26/18 arrival time is at 1:45 pm at [facility] and she check in at admitting. Patient is aware of time and day. I also gave her their number of [phone] incase she needs to r/s for any reason.. I also informed her I will send the MRI order to GI and they will reach out to her to schedule that in the next couple days..  Also, a new order for the Carotid needs to be put in.Sonia Side at [facility] said it needs to be VAS [id number].

## 2018-12-17 NOTE — Telephone Encounter (Signed)
Noted, thank you

## 2018-12-26 ENCOUNTER — Ambulatory Visit (HOSPITAL_COMMUNITY)
Admission: RE | Admit: 2018-12-26 | Discharge: 2018-12-26 | Disposition: A | Discharge status: Home / Self Care | Payer: Medicare Other | Source: Ambulatory Visit | Attending: Internal Medicine | Admitting: Internal Medicine

## 2018-12-26 ENCOUNTER — Ambulatory Visit (HOSPITAL_BASED_OUTPATIENT_CLINIC_OR_DEPARTMENT_OTHER)
Admission: RE | Admit: 2018-12-26 | Discharge: 2018-12-26 | Disposition: A | Discharge status: Home / Self Care | Payer: Medicare Other | Source: Ambulatory Visit | Attending: Internal Medicine | Admitting: Internal Medicine

## 2018-12-26 DIAGNOSIS — I10 Essential (primary) hypertension: Secondary | ICD-10-CM | POA: Insufficient documentation

## 2018-12-26 DIAGNOSIS — I2510 Atherosclerotic heart disease of native coronary artery without angina pectoris: Secondary | ICD-10-CM | POA: Diagnosis not present

## 2018-12-26 DIAGNOSIS — G459 Transient cerebral ischemic attack, unspecified: Secondary | ICD-10-CM | POA: Diagnosis present

## 2018-12-26 NOTE — Progress Notes (Signed)
  Echocardiogram 2D Echocardiogram has been performed.  Ashley Hanson L Androw 12/26/2018, 2:31 PM

## 2018-12-26 NOTE — Progress Notes (Signed)
Carotid artery duplex completed. Refer to "CV Proc" under chart review to view preliminary results.  12/26/2018 2:06 PM Maudry Mayhew, MHA, RVT, RDCS, RDMS

## 2018-12-27 ENCOUNTER — Telehealth: Payer: Self-pay

## 2018-12-27 ENCOUNTER — Telehealth: Payer: Self-pay | Admitting: Neurology

## 2018-12-27 NOTE — Telephone Encounter (Signed)
Spoke to her daughter, [name] (on [place]).  She is aware of the results below and verbalized understanding that her mother should continue aspirin 81mg , one tab daily.

## 2018-12-27 NOTE — Telephone Encounter (Signed)
I called pt and advised her of her unremarkable echo results. Pt verbalized understanding of results. Pt had no questions at this time but was encouraged to call back if questions arise.

## 2018-12-27 NOTE — Telephone Encounter (Signed)
I called pt to discuss her echo results. No answer, no VM set up. Will try again later.

## 2018-12-27 NOTE — Telephone Encounter (Signed)
-----   Message from Suzzanne Cloud, NP sent at 12/27/2018 12:40 PM EST ----- Please call the patient and let her know that her Echo was unremarkable.   Thank you! Judson Roch

## 2018-12-27 NOTE — Telephone Encounter (Signed)
Please call patient, bilateral IC showed less than 39% stenosis,   anterograde flow at bilateral vertebral artery and basilar artery. She should continue take ASA 81mg  daily  Summary: Right Carotid: Velocities in the right ICA are consistent with a 1-39% stenosis.  Left Carotid: Velocities in the left ICA are consistent with a 1-39% stenosis.  Vertebrals:  Bilateral vertebral arteries demonstrate antegrade flow. Subclavians: Normal flow hemodynamics were seen in bilateral subclavian              arteries.

## 2018-12-31 ENCOUNTER — Other Ambulatory Visit: Payer: Medicare Other

## 2019-01-08 ENCOUNTER — Ambulatory Visit
Admission: RE | Admit: 2019-01-08 | Discharge: 2019-01-08 | Disposition: A | Discharge status: Home / Self Care | Payer: Medicare Other | Source: Ambulatory Visit | Attending: Neurology | Admitting: Neurology

## 2019-01-08 DIAGNOSIS — G459 Transient cerebral ischemic attack, unspecified: Secondary | ICD-10-CM | POA: Diagnosis not present

## 2019-01-13 ENCOUNTER — Telehealth: Payer: Self-pay | Admitting: Neurology

## 2019-01-13 NOTE — Telephone Encounter (Signed)
Called the pt and made her aware that the MRI of her brain was normal and there was no acute findings. Pt verbalized understanding. Pt had no questions at this time but was encouraged to call back if questions arise. Patient is scheduled wed to have a EEG and we will call her once we have those results.

## 2019-01-13 NOTE — Telephone Encounter (Signed)
-----   Message from Suzzanne Cloud, NP sent at 01/13/2019  7:42 AM EST ----- Please call the patient and let her know that her MRI of the brain was unremarkable.  No evidence of stroke was seen on the MRI.   Thank you,  Judson Roch

## 2019-01-15 ENCOUNTER — Ambulatory Visit (INDEPENDENT_AMBULATORY_CARE_PROVIDER_SITE_OTHER): Payer: Medicare Other | Admitting: Neurology

## 2019-01-15 DIAGNOSIS — R4182 Altered mental status, unspecified: Secondary | ICD-10-CM | POA: Diagnosis not present

## 2019-01-15 DIAGNOSIS — G459 Transient cerebral ischemic attack, unspecified: Secondary | ICD-10-CM

## 2019-01-24 NOTE — Procedures (Signed)
   HISTORY: 78 year old female, with history of dementia, presented with acute altered mental status, Is on polypharmacy treatment, pending, Aricept,  TECHNIQUE:  This is a routine 16 channel EEG recording with one channel devoted to a limited EKG recording.  It was performed during wakefulness, drowsiness and asleep.  Hyperventilation was not performed, and photic stimulation were performed as activating procedures.  There are minimum muscle and movement artifact noted.  Upon maximum arousal, posterior dominant waking rhythm consistent of diffusely small amplitude beta range activity. Activities are symmetric over the bilateral posterior derivations and attenuated with eye opening.  Photic stimulation did not alter the tracing.  During EEG recording, patient developed drowsiness and no deeper stage of sleep was achieved  During EEG recording, there was no epileptiform discharge noted.  EKG demonstrate sinus rhythm, with heart rate of 54 bpm  CONCLUSION: This is a  normal EEG.  There is no electrodiagnostic evidence of epileptiform discharge.  The prevalence of beta range activity is usually related to medication side effect, especially benzodiazepine.  Marcial Pacas, M.D. Ph.D.  [facility] [place], [place] [address] Phone: [phone] Fax:      [phone]

## 2019-01-27 ENCOUNTER — Telehealth: Payer: Self-pay

## 2019-01-27 NOTE — Telephone Encounter (Signed)
-----   Message from Suzzanne Cloud, NP sent at 01/27/2019  7:45 AM EDT ----- Please call the patient and let her know that her EEG was normal. Thank you.

## 2019-01-27 NOTE — Telephone Encounter (Signed)
Left a voicemail for Ashley(DPR verified) explaining that her mother's EEG results were normal. Office number provided in case she has any further questions.

## 2019-02-11 ENCOUNTER — Telehealth: Payer: Self-pay

## 2019-02-11 NOTE — Telephone Encounter (Signed)
Spoke with the patient and she has verbally given consent to doing a telephone visit with Judson Roch, NP tomorrow.   Patient mentioned that her only concern was that she would like to know what caused her have a TIA. She mentioned that she hasn't had any changes since her last office visit.

## 2019-02-12 ENCOUNTER — Ambulatory Visit (INDEPENDENT_AMBULATORY_CARE_PROVIDER_SITE_OTHER): Payer: Medicare Other | Admitting: Neurology

## 2019-02-12 ENCOUNTER — Other Ambulatory Visit: Payer: Self-pay

## 2019-02-12 ENCOUNTER — Encounter: Payer: Self-pay | Admitting: Neurology

## 2019-02-12 DIAGNOSIS — R404 Transient alteration of awareness: Secondary | ICD-10-CM | POA: Diagnosis not present

## 2019-02-12 NOTE — Progress Notes (Signed)
Virtual Visit via Telephone Note  I connected with Nyia Tsao on 02/12/19 at  3:45 PM EDT by telephone and verified that I am speaking with the correct person using two identifiers.   I discussed the limitations, risks, security and privacy concerns of performing an evaluation and management service by telephone and the availability of in person appointments. I also discussed with the patient that there may be a patient responsible charge related to this service. The patient expressed understanding and agreed to proceed.   History of Present Illness:  HISTORY OF PRESENT ILLNESS  08/05/2018 Hassell Done, NP Lanijah Warzecha a 78 years old right-handed female, last clinical visit was with Dr. Jim Like August 2015 previously a patient of Dr. Erling Cruz  She had a history of chronic ocular migraine the past, presented with colorful visual distortion, oftentimes followed by retro-orbital area headaches, when she was younger she has headaches frequently, sometimes the point of passing out, her headache has much improved, especially while take gabapentin 600 mg twice a day, since September 2016, she began to have frequent almost daily pressure behind both eyes, also complains of decreased vision, she has tried over-the-counter ibuprofen Tylenol did not work for her,  She had a history of macular degeneration with decreased vision to begin with, also had bilateral cataract surgery, recent diagnosis of glaucoma by ophthalmologist Dr. Carolynn Sayers She complains of many years history of gradual onset memory trouble, she retired as a Therapist, occupational, she still able to drive, lives in apartment by herself, manage her whole own finances, Gait difficulty due to bilateral feet pain, scoliosis, chronic low back pain.  UPDATE Nov 01 2015:YYI reviewed a note from her ophthalmologist Dr. Katy Fitch dated October 28 2015, patient complains of visual distortions of shapes and sizes, movement of visual imagings, Funduscopy examination  showed normal appearance of optic nerve with healthy pink cream and normal appearance nerve fiber layers, macular showed dry age-related macular degeneration, vessels are normal, vitreous was normal, The conclusion was suspected glaucoma, OD was 12, OS was 14, reported that her father had a history of glaucoma She was put on 3 eye drops, she has not started yet, she also had bilateral conjunctiva erythematous change, was diagnosed with recurrent iridocystitis Previous laboratory evaluation showed elevated C reactive protein of 21, with normal ESR, She wear sunglasses, she complains of light sensitive, she continues both retro-orbital area pressure pain, constant 9/10, x24 hours. Topamax '100mg'$  bid did not help her headaches  UPDATE Nov 18 2015:YY Her headache and conjunctiva erythematous has much improved, everytime she took Fioricet, she had GI side effect, feel sick, band like sensation. She was started on prednisone since December twelfth 2016, started from 20 mg daily, now 10 milligrams daily, she can tolerate the medication well, which has helped her headaches, she is also on higher dose of Topamax 100 mg twice a day, Gabapentin up to 600 mg twice a day did not help her headaches, or her bilateral lower extremity paresthesia pain, will change to Lyrica today, Previous laboratory showed elevated C reactive protein up to 20.9, her headache has much improved with prednisone, likely a component of inflammatory process  UPDATE Dec 28 2015:YY Her headache, and eye pain has much improved, with no recurrent headache after tapering off prednisone, she is now taking Topamax 100 mg twice a day, she is complaining of increased memory trouble, difficulty focusing, She lives by herself since 02/23/10 when her husband passed away, she graduated from high school, used to work as a  Museum/gallery curator, retired around age 37, she has strong family history of Alzheimer's disease, her maternal grandmother, mother,  maternal aunt, sister suffered Alzheimer's disease  UPDATE May 8th 2017:YYI reviewed laboratory evaluation, she has elevated ESR of 50, previous elevated C reactive protein up to 21 in Nov 2016, frequent headaches, responded very well to prednison, she is off steroid now, has no recurrent headache,   I have talked about potential temporal artery biopsy to rule out temporal arteritis, she adamantly refused, she suffered severe keloid, with associated neuropathic pain, She is taking Topamax 50 mg twice a day, overall tolerating it well, she no longer has frequent migraine headaches, She complains of diffuse body achy pain multiple joints pain, neuropathic pain around her keloid, worsening scoliosis, and worsening gait difficulty We have personally reviewed MRI of the brain without contrast in April 2017, there was no significant abnormality UPDATE Nov 8th 2017:[name] is accompanied by her daughter at today's clinical visit, she reported recent onset of left side headache, left eye achy pain, overall has improved, today's Mini-Mental status examination 29/30  We have personally reviewed MRI brain in February 2017, generalized atrophy mostly at the left parasylvian fissure, slight supratentorium small vessel disease. She is able to tolerate Namenda 10 mg twice a day, Cymbalta 60 mg well, she lives by herself, just sold her car, is in the process of get scat service, continue have diffuse body joints pain, gait abnormality  UPDATE 05/16/2018CM Ms. Plaza , 78 year old female returns for follow-up She has a history of mild cognitive impairment and her MMSE today is 28 out of 30. Last was 29 out of 30. She is currently on Namenda and Aricept without side effects. She occasionally has word finding problems. She denies any recent falls she does ambulate with a cane. She has diffuse body joint pain. Previous history of right knee replacement needs left knee replacement. She was placed on Cymbalta at her  last visit with Dr.Yan however she is not taking the medication.She claims that her headaches have stopped no headaches in over 3 months , she has stopped her Topamax. She returns for reevaluation  UPDATE 12/05/2017 CMMs. Schreurs, 78 year old female returns for follow-up history of mild cognitive impairment. She occasionally has some word finding problems. Her MMSE 26 today which is stable. She is currently on Namenda twice daily and Aricept taking it at night. She complains with occasional hallucinations has not slept well. She was asked to take her Aricept in the morning. She has not had any recent falls uses a single-point cane to ambulate she is no longer having headaches and stopped her Topamax he also has stopped her Cymbalta that she was taking for knee pain. She says she is due to get a knee replacement in the summer. She returns for reevaluation UPDATE9/16/2019CMMs. Hostler, 78 year old female returns for follow-up with history of mild cognitive impairment. She had hip surgery June 28, 2018 right hip replacement. She is currently receiving physical therapy in the home twice a week. She is seated in a wheelchair she continues to have some word finding issues. MMSE30/30.She is currently on Aricept and Namenda without side effects. No recent falls. Denies any headaches. Scores 5/6 on Ron Parker index for ADLs. 5/8 on IADLs.No new neurologic complaints she returns for reevaluation  ED follow-up 12/16/2018 : Ms. Drewry is a 78 year old female with history of dementia who presented to the ED on 11/21/2018 for altered mental status. She was brought via car after her granddaughter reports the patient had an episode  where she couldn't open her eyes or get her words out lasting about 10 minutes. Apparently she was asleep in her chair, her granddaughter asked her a question and then noticed that the patient was slumped over in her chair and mumbling. She remembers trying to get her words out, but she  couldn't. Finally, she opened her eyes and her speech came back. When she came to, she had a headache. She stood up and got herself dressed. Her daughter then drove her to the ED The patient has good memory of this episode, specially feeling frustrated that she couldn't get her words out. She reports the day this episode occurred was a normal day and that she was feeling at her baseline. There was no weakness or facial droop. She does report since then the right side of her face has "felt weird", only one time, but then subsided.  She reports last week, she was sitting in her chair. She felt a huge pain to middle of face "like it split her face". Then it went away. She reports her right eye has been leaking large drops of water, not very often. She has ringing in her ears and pain down the right side of her face. She reports having this right sided facial pain previously.   Memory, sometimes has good days and bad days. She notices sometimes she forgets what she wants to say. She is taking the Aricept and Namenda without side effect. She lives with her daughter. She presents today for ED follow-up  Update Today February 12 2019 SS: Ms. Haughton is a 78 year old female who presents for follow-up for possible TIA/seizure, to review her work-up results.  She has had a carotid ultrasound in February 2020 showed bilateral IC  less than 39% stenosis,  Anterograde flow at bilateral vertebral arteries.  She should continue taking aspirin 81 mg daily.  She had MRI of the brain in February 2020 that was normal, did not show an acute stroke.  EEG was normal. Echo was unremarkable.  I talked to Ms. Dieter on the phone today.  She reports that she has been feeling well, has not had any new events.  She reports that she continues taking her daily aspirin, she actually takes 2 baby aspirin's.  She reports close follow-up with her primary care doctor, Dr. Nyoka Cowden.  She reports they regularly check her cholesterol, A1c.  She reports  these findings have been normal.  She does report history of high blood pressure, taking propanolol, losartan daily.  She does not smoke.  She does not have any new problems or concerns.  She has been doing well, feeling well.  She just wants to discuss the results of her work-up.  Work up done in February 2020: MRI of the brain, EEG, Echo, Vascular US carotid Duplex   MRI of the brain 01/08/2019 IMPRESSION:  Unremarkable MRI brain (without). No acute findings.   Observations/Objective: Reviewed her health history, medications  Very pleasant 78 year old female, alert, appropriate verbal response, knowledgeable of health condition   Assessment and Plan: 1.  Transient loss of awareness, single episode, work-up for possible seizure/TIA  Overall she reports she has been doing well.  She had a reassuring work-up back in February 2020.  She will continue taking 81 mg daily aspirin.  I advised her to be as active as possible/exercise, eat healthy foods, manage risk factors (BP, cholesterol, A1C).  She reports her primary care doctor checks her cholesterol, A1c regularly.  She reports that this has all  been within normal limits.  I advised her to obtain a copy and bring it at her next visit.  She will closely monitor her symptoms and let us know if she has any recurrence.  She will follow-up in 4 to 6 months in this office for routine migraine and memory concerns.   Follow Up Instructions: She will follow-up in 4 to 6 months for a routine revisit    I discussed the assessment and treatment plan with the patient. The patient was provided an opportunity to ask questions and all were answered. The patient agreed with the plan and demonstrated an understanding of the instructions.   The patient was advised to call back or seek an in-person evaluation if the symptoms worsen or if the condition fails to improve as anticipated.  I provided 20 minutes of non-face-to-face time during this  encounter.   Evangeline Dakin, DNP  [facility] [address] [place], [place] [address] [phone]

## 2019-02-19 NOTE — Progress Notes (Signed)
I have reviewed and agreed above plan. 

## 2019-04-01 ENCOUNTER — Telehealth: Payer: Self-pay

## 2019-04-01 NOTE — Telephone Encounter (Signed)
Ashley Roch, NP recommended a follow up in August for this pt. I called pt, scheduled her for 8/12 at 3:15pm. Pt verbalized understanding of new appt date and time.

## 2019-04-09 ENCOUNTER — Other Ambulatory Visit: Payer: Self-pay | Admitting: Neurology

## 2019-06-04 ENCOUNTER — Telehealth: Payer: Self-pay | Admitting: *Deleted

## 2019-06-04 NOTE — Telephone Encounter (Signed)
Virtual Visit Pre-Appointment Phone Call  "(Ashley Hanson), I am calling you today to discuss your upcoming appointment. We are currently trying to limit exposure to the virus that causes COVID-19 by seeing patients at home rather than in the office."  1. "What is the BEST phone number to call the day of the visit?" - include this in appointment notes  2. "Do you have or have access to (through a family member/friend) a smartphone with video capability that we can use for your visit?" a. If yes - list this number in appt notes as "cell" (if different from BEST phone #) and list the appointment type as a VIDEO visit in appointment notes b. If no - list the appointment type as a PHONE visit in appointment notes  3. Confirm consent - "In the setting of the current Covid19 crisis, you are scheduled for a (phone or video) visit with your provider on (date) at (time).  Just as we do with many in-office visits, in order for you to participate in this visit, we must obtain consent.  If you'd like, I can send this to your mychart (if signed up) or email for you to review.  Otherwise, I can obtain your verbal consent now.  All virtual visits are billed to your insurance company just like a normal visit would be.  By agreeing to a virtual visit, we'd like you to understand that the technology does not allow for your provider to perform an examination, and thus may limit your provider's ability to fully assess your condition. If your provider identifies any concerns that need to be evaluated in person, we will make arrangements to do so.  Finally, though the technology is pretty good, we cannot assure that it will always work on either your or our end, and in the setting of a video visit, we may have to convert it to a phone-only visit.  In either situation, we cannot ensure that we have a secure connection.  Are you willing to proceed?"YES  4. Advise patient to be prepared - "Two hours prior to your appointment, go  ahead and check your blood pressure, pulse, oxygen saturation, and your weight (if you have the equipment to check those) and write them all down. When your visit starts, your provider will ask you for this information. If you have an Apple Watch or Kardia device, please plan to have heart rate information ready on the day of your appointment. Please have a pen and paper handy nearby the day of the visit as well."  5. Give patient instructions for MyChart download to smartphone OR Doximity/Doxy.me as below if video visit (depending on what platform provider is using)  6. Inform patient they will receive a phone call 15 minutes prior to their appointment time (may be from unknown caller ID) so they should be prepared to answer    TELEPHONE CALL NOTE  Vandella Ord has been deemed a candidate for a follow-up tele-health visit to limit community exposure during the Covid-19 pandemic. I spoke with the patient via phone to ensure availability of phone/video source, confirm preferred email & phone number, and discuss instructions and expectations.  I reminded Christyne Mccain to be prepared with any vital sign and/or heart rhythm information that could potentially be obtained via home monitoring, at the time of her visit. I reminded Kyana Aicher to expect a phone call prior to her visit.  Ricci Barker, RN 06/04/2019 3:14 PM   INSTRUCTIONS FOR DOWNLOADING THE Deloris Ping  APP TO SMARTPHONE  - The patient must first make sure to have activated MyChart and know their login information - If Apple, go to [employer] and type in MyChart in the search bar and download the app. If Android, ask patient to go to [employer] and type in Ruth in the search bar and download the app. The app is free but as with any other app downloads, their phone may require them to verify saved payment information or Apple/Android password.  - The patient will need to then log into the app with their MyChart username and  password, and select [facility] as their healthcare provider to link the account. When it is time for your visit, go to the MyChart app, find appointments, and click Begin Video Visit. Be sure to Select Allow for your device to access the Microphone and Camera for your visit. You will then be connected, and your provider will be with you shortly.  **If they have any issues connecting, or need assistance please contact MyChart service desk [phone])**  **If using a computer, in order to ensure the best quality for their visit they will need to use either of the following Internet Browsers: [employer], or Google Chrome**  IF USING DOXIMITY or DOXY.ME - The patient will receive a link just prior to their visit by text.     FULL LENGTH CONSENT FOR TELE-HEALTH VISIT   I hereby voluntarily request, consent and authorize [facility] and its employed or contracted physicians, physician assistants, nurse practitioners or other licensed health care professionals (the Practitioner), to provide me with telemedicine health care services (the "Services") as deemed necessary by the treating Practitioner. I acknowledge and consent to receive the Services by the Practitioner via telemedicine. I understand that the telemedicine visit will involve communicating with the Practitioner through live audiovisual communication technology and the disclosure of certain medical information by electronic transmission. I acknowledge that I have been given the opportunity to request an in-person assessment or other available alternative prior to the telemedicine visit and am voluntarily participating in the telemedicine visit.  I understand that I have the right to withhold or withdraw my consent to the use of telemedicine in the course of my care at any time, without affecting my right to future care or treatment, and that the Practitioner or I may terminate the telemedicine visit at any time. I  understand that I have the right to inspect all information obtained and/or recorded in the course of the telemedicine visit and may receive copies of available information for a reasonable fee.  I understand that some of the potential risks of receiving the Services via telemedicine include:  Marland Kitchen Delay or interruption in medical evaluation due to technological equipment failure or disruption; . Information transmitted may not be sufficient (e.g. poor resolution of images) to allow for appropriate medical decision making by the Practitioner; and/or  . In rare instances, security protocols could fail, causing a breach of personal health information.  Furthermore, I acknowledge that it is my responsibility to provide information about my medical history, conditions and care that is complete and accurate to the best of my ability. I acknowledge that Practitioner's advice, recommendations, and/or decision may be based on factors not within their control, such as incomplete or inaccurate data provided by me or distortions of diagnostic images or specimens that may result from electronic transmissions. I understand that the practice of medicine is not an exact science and that Practitioner makes no  warranties or guarantees regarding treatment outcomes. I acknowledge that I will receive a copy of this consent concurrently upon execution via email to the email address I last provided but may also request a printed copy by calling the office of [facility].    I understand that my insurance will be billed for this visit.   I have read or had this consent read to me. . I understand the contents of this consent, which adequately explains the benefits and risks of the Services being provided via telemedicine.  . I have been provided ample opportunity to ask questions regarding this consent and the Services and have had my questions answered to my satisfaction. . I give my informed consent for the services to be  provided through the use of telemedicine in my medical care  By participating in this telemedicine visit I agree to the above.

## 2019-06-05 ENCOUNTER — Telehealth (INDEPENDENT_AMBULATORY_CARE_PROVIDER_SITE_OTHER): Payer: Medicare Other | Admitting: Cardiovascular Disease

## 2019-06-05 VITALS — Ht 65.0 in | Wt 236.0 lb

## 2019-06-05 DIAGNOSIS — I10 Essential (primary) hypertension: Secondary | ICD-10-CM | POA: Diagnosis not present

## 2019-06-05 DIAGNOSIS — I2510 Atherosclerotic heart disease of native coronary artery without angina pectoris: Secondary | ICD-10-CM

## 2019-06-05 DIAGNOSIS — R404 Transient alteration of awareness: Secondary | ICD-10-CM

## 2019-06-05 NOTE — Patient Instructions (Signed)

## 2019-06-05 NOTE — Progress Notes (Signed)
Virtual Visit via Telephone Note   This visit type was conducted due to national recommendations for restrictions regarding the COVID-19 Pandemic (e.g. social distancing) in an effort to limit this patient's exposure and mitigate transmission in our community.  Due to her co-morbid illnesses, this patient is at least at moderate risk for complications without adequate follow up.  This format is felt to be most appropriate for this patient at this time.  The patient did not have access to video technology/had technical difficulties with video requiring transitioning to audio format only (telephone).  All issues noted in this document were discussed and addressed.  No physical exam could be performed with this format.  Please refer to the patient's chart for her  consent to telehealth for [facility].   Date:  06/05/2019   ID:  Ashley Hanson, DOB Jun 14, 1941, MRN [id number]  Patient Location: Home Provider Location: Home  PCP:  Levin Erp, MD  Cardiologist:  Sanda Klein, MD  Electrophysiologist:  None   Evaluation Performed:  Follow-Up Visit  Chief Complaint:  HTN  History of Present Illness:    Ashley Hanson is a 78 y.o. female with a longstanding history of systemic hypertension and more recent development of dementia.  In January she had an episode of altered consciousness where she was poorly responsive and there was concern for possible stroke.  Her work-up included an echocardiogram and bilateral carotid artery Doppler ultrasonography, both tests with completely normal results.  She also had an abnormal MRI of the brain.  Routine labs were unremarkable.  She has been well since then, without any new neurological complaints.  She takes donepezil and memantine teen for dementia.  She has occasional falls and describes himself as being clumsy since childhood.  She states that "I fall so often that I have learned how to fall without hurting myself".  Indeed, none of these falls have  been associated with severe injury and none of them are associated with loss of consciousness.  She blames known partly on neuropathy in her feet.  She denies angina or dyspnea at rest or with activity, leg edema, claudication, palpitations or other cardiovascular complaints.  She has a history of "false positive" nuclear stress test in the past and had normal coronaries by remote angiography in 2005.  Her most recent nuclear stress test in June 2019 was a frankly normal study.  The patient does not have symptoms concerning for COVID-19 infection (fever, chills, cough, or new shortness of breath).    Past Medical History:  Diagnosis Date  . Anginal pain [facility])    sees [name]; reportedly negative stress/echo 2012  . Arthritis    "hands, elbows, shoulders, and back" (06/28/2018)  . Breast cancer, left breast ([place])   . Carpal tunnel syndrome    bilaterally  . Chronic lower back pain   . Complication of anesthesia    "difficutly waking up and hypothermia and stopped breathing"  . Confusion   . Dementia ([place])    "stage 4; w/both long term and short term memory loss" (06/28/2018)  . Depression   . Diverticulosis   . Esophageal reflux   . Hypertension    sees [name]  . Hypothyroidism   . IBS (irritable bowel syndrome)   . IBS (irritable bowel syndrome)   . Melanoma ([place])    2nd digit right hand; bottom of left foot" (06/28/2018)  . Migraines    "stopped having them 3-4 years ago" (06/28/2018)  . Myocardial infarction ([place])    "  a mini one; early 2000s maybe"  . Osteoporosis   . PONV (postoperative nausea and vomiting)   . Refusal of blood transfusions as patient is Jehovah's Witness   . Scoliosis    Past Surgical History:  Procedure Laterality Date  . ANKLE SURGERY Left 10/2007   "fell at work; don't know what they did to it"  . BREAST BIOPSY Left "X 2"  . BREAST LUMPECTOMY Left   . BREAST SURGERY Left    "removed scar tissue"  . CARDIAC CATHETERIZATION  X 2  .  CARDIOVASCULAR STRESS TEST    . CARPAL TUNNEL RELEASE Right 1995  . CARPAL TUNNEL RELEASE Left   . CATARACT EXTRACTION W/ INTRAOCULAR LENS  IMPLANT, BILATERAL    . CHOLECYSTECTOMY  10/04/2012   Procedure: LAPAROSCOPIC CHOLECYSTECTOMY WITH INTRAOPERATIVE CHOLANGIOGRAM;  Surgeon: Adin Hector, MD;  Location: [facility];  Service: General;  Laterality: N/A;  laparosopic cholecystectomy with cholangiogram and repair of umbilical hernia  . COLONOSCOPY  08/30/11  . DILATION AND CURETTAGE OF UTERUS    . DOPPLER ECHOCARDIOGRAPHY     hx   . ERCP  10/05/2012   Procedure: ENDOSCOPIC RETROGRADE CHOLANGIOPANCREATOGRAPHY (ERCP);  Surgeon: Jeryl Columbia, MD;  Location: [facility] ENDOSCOPY;  Service: Endoscopy;  Laterality: N/A;  . ESOPHAGOGASTRODUODENOSCOPY  08/30/11  . HERNIA REPAIR  10/02/2014   "in my stomach; took it out w/gallbladder"  . JOINT REPLACEMENT    . KELOID EXCISION Bilateral    "earlobes"  . KNEE ARTHROSCOPY Right 06/2007  . MELANOMA EXCISION Bilateral    2nd digit right hand; bottom of left foot" (06/28/2018)  . SHOULDER ARTHROSCOPY Right 06/2011  . SKIN GRAFT Right    "related to melanoma restricting my finger bending"  . TOTAL HIP ARTHROPLASTY Right 06/28/2018  . TOTAL HIP ARTHROPLASTY Right 06/28/2018   Procedure: RIGHT TOTAL HIP ARTHROPLASTY ANTERIOR APPROACH;  Surgeon: Frederik Pear, MD;  Location: [facility];  Service: Orthopedics;  Laterality: Right;  . TOTAL KNEE ARTHROPLASTY Right 08/2008  . TUBAL LIGATION       Current Meds  Medication Sig  . aspirin EC 81 MG tablet Take 1 tablet (81 mg total) by mouth 2 (two) times daily.  . Calcium Carbonate-Vit D-Min (CALCIUM 1200 PO) Take 1 tablet by mouth 2 (two) times daily.  . Cholecalciferol (VITAMIN D-3) 1000 units CAPS Take 1 tablet by mouth daily.   Marland Kitchen diltiazem (CARDIZEM CD) 120 MG 24 hr capsule Take 120 mg by mouth daily.   Marland Kitchen docusate sodium (COLACE) 100 MG capsule Take 100 mg by mouth 2 (two) times daily.  Marland Kitchen donepezil (ARICEPT) 10 MG  tablet Take 1 tablet (10 mg total) by mouth every morning.  . furosemide (LASIX) 40 MG tablet Take 120 mg by mouth daily.   Marland Kitchen gabapentin (NEURONTIN) 300 MG capsule Take 900 mg by mouth 3 (three) times daily.   . hypromellose (SYSTANE OVERNIGHT THERAPY) 0.3 % GEL ophthalmic ointment Place 1 application into both eyes at bedtime.  Marland Kitchen levothyroxine (SYNTHROID, LEVOTHROID) 50 MCG tablet Take 50 mcg by mouth daily before breakfast.  . losartan (COZAAR) 100 MG tablet Take 100 mg by mouth daily.   . memantine (NAMENDA) 10 MG tablet Take 1 tablet (10 mg total) by mouth 2 (two) times daily.  . Multiple Vitamins-Minerals (PRESERVISION AREDS PO) Take 1 capsule by mouth 2 (two) times daily.   Marland Kitchen oxyCODONE-acetaminophen (PERCOCET/ROXICET) 5-325 MG tablet Take 1 tablet by mouth every 4 (four) hours as needed for severe pain.  . potassium chloride (  K-DUR,KLOR-CON) 10 MEQ tablet Take 10 mEq by mouth daily.  . propranolol (INDERAL) 60 MG tablet Take 1 tablet (60 mg total) by mouth 2 (two) times daily.  . propranolol (INDERAL) 80 MG tablet Take 80 mg by mouth 2 (two) times daily.  . sodium chloride (MURO 128) 5 % ophthalmic solution Place 1 drop into both eyes 4 (four) times daily.   Marland Kitchen tiZANidine (ZANAFLEX) 2 MG tablet Take 1 tablet (2 mg total) by mouth every 6 (six) hours as needed.     Allergies:   Adhesive [tape], Plavix [clopidogrel bisulfate], and Warfarin and related   Social History   Tobacco Use  . Smoking status: Never Smoker  . Smokeless tobacco: Never Used  Substance Use Topics  . Alcohol use: Not Currently  . Drug use: Never     Family Hx: The patient's family history includes Breast cancer in her sister; Colon polyps in her mother; Dementia in her sister; Sleep apnea in her daughter.  ROS:   Please see the history of present illness.    All other systems reviewed and are negative.   Prior CV studies:   The following studies were reviewed today: Echocardiogram and carotid Dopplers  12/26/2018  Labs/Other Tests and Data Reviewed:    EKG:  An ECG dated 11/21/2018 was personally reviewed today and demonstrated:  Sinus rhythm, questionable left atrial abnormality and left ventricular hypertrophy by voltage only  Recent Labs: 11/21/2018: ALT 30; BUN 8; Creatinine, Ser 0.70; Hemoglobin 13.9; Platelets 166; Potassium 3.8; Sodium 143   Recent Lipid Panel No results found for: CHOL, TRIG, HDL, CHOLHDL, LDLCALC, LDLDIRECT  Wt Readings from Last 3 Encounters:  06/05/19 236 lb (107 kg)  12/16/18 233 lb (105.7 kg)  11/21/18 246 lb (111.6 kg)     Objective:    Vital Signs:  Ht 5\' 5"  (1.651 m)   Wt 236 lb (107 kg)   BMI 39.27 kg/m    VITAL SIGNS:  reviewed Unable to examine  ASSESSMENT & PLAN:    1. HTN: She is unable to check it today but with most recently documented blood pressure was 138/70 which I think is acceptable for this elderly lady with falls.  2. History of false-positive nuclear stress test with normal coronary angiography.  Normal left ventricular systolic and diastolic function. 3.  COVID-19 Education: The signs and symptoms of COVID-19 were discussed with the patient and how to seek care for testing (follow up with PCP or arrange E-visit).  The importance of social distancing was discussed today.  Time:   Today, I have spent 13 minutes with the patient with telehealth technology discussing the above problems.     Medication Adjustments/Labs and Tests Ordered: Current medicines are reviewed at length with the patient today.  Concerns regarding medicines are outlined above.   Tests Ordered: No orders of the defined types were placed in this encounter.   Medication Changes: No orders of the defined types were placed in this encounter.   Follow Up:  Virtual Visit or In Person 1 year  Signed, Sanda Klein, MD  06/05/2019 1:44 PM    [place]

## 2019-07-01 NOTE — Progress Notes (Deleted)
PATIENT: Ashley Hanson DOB: May 27, 1941  REASON FOR VISIT: follow up HISTORY FROM: patient  HISTORY OF PRESENT ILLNESS: Today 07/01/19  HISTORY  Ashley Hanson a 78 years old right-handed female, last clinical visit was with Dr. Jim Like August 2015 previously a patient of Dr. Erling Cruz  She had a history of chronic ocular migraine the past, presented with colorful visual distortion, oftentimes followed by retro-orbital area headaches, when she was younger she has headaches frequently, sometimes the point of passing out, her headache has much improved, especially while take gabapentin 600 mg twice a day, since September 2016, she began to have frequent almost daily pressure behind both eyes, also complains of decreased vision, she has tried over-the-counter ibuprofen Tylenol did not work for her,  She had a history of macular degeneration with decreased vision to begin with, also had bilateral cataract surgery, recent diagnosis of glaucoma by ophthalmologist Dr. Carolynn Sayers She complains of many years history of gradual onset memory trouble, she retired as a Therapist, occupational, she still able to drive, lives in apartment by herself, manage her whole own finances, Gait difficulty due to bilateral feet pain, scoliosis, chronic low back pain.  UPDATE Nov 01 2015:YYI reviewed a note from her ophthalmologist Dr. Katy Fitch dated October 28 2015, patient complains of visual distortions of shapes and sizes, movement of visual imagings, Funduscopy examination showed normal appearance of optic nerve with healthy pink cream and normal appearance nerve fiber layers, macular showed dry age-related macular degeneration, vessels are normal, vitreous was normal, The conclusion was suspected glaucoma, OD was 12, OS was 14, reported that her father had a history of glaucoma She was put on 3 eye drops, she has not started yet, she also had bilateral conjunctiva erythematous change, was diagnosed with recurrent  iridocystitis Previous laboratory evaluation showed elevated C reactive protein of 21, with normal ESR, She wear sunglasses, she complains of light sensitive, she continues both retro-orbital area pressure pain, constant 9/10, x24 hours. Topamax 190m bid did not help her headaches  UPDATE Nov 18 2015:YY Her headache and conjunctiva erythematous has much improved, everytime she took Fioricet, she had GI side effect, feel sick, band like sensation. She was started on prednisone since December twelfth 2016, started from 20 mg daily, now 10 milligrams daily, she can tolerate the medication well, which has helped her headaches, she is also on higher dose of Topamax 100 mg twice a day, Gabapentin up to 600 mg twice a day did not help her headaches, or her bilateral lower extremity paresthesia pain, will change to Lyrica today, Previous laboratory showed elevated C reactive protein up to 20.9, her headache has much improved with prednisone, likely a component of inflammatory process  UPDATE Dec 28 2015:YY Her headache, and eye pain has much improved, with no recurrent headache after tapering off prednisone, she is now taking Topamax 100 mg twice a day, she is complaining of increased memory trouble, difficulty focusing, She lives by herself since M04/22/2011when her husband passed away, she graduated from high school, used to work as a sMuseum/gallery curator retired around age 78 she has strong family history of Alzheimer's disease, her maternal grandmother, mother, maternal aunt, sister suffered Alzheimer's disease  UPDATE May 8th 2017:YYI reviewed laboratory evaluation, she has elevated ESR of 50, previous elevated C reactive protein up to 21 in Nov 2016, frequent headaches, responded very well to prednison, she is off steroid now, has no recurrent headache,   I have talked about potential temporal artery biopsy  to rule out temporal arteritis, she adamantly refused, she suffered severe keloid, with  associated neuropathic pain, She is taking Topamax 50 mg twice a day, overall tolerating it well, she no longer has frequent migraine headaches, She complains of diffuse body achy pain multiple joints pain, neuropathic pain around her keloid, worsening scoliosis, and worsening gait difficulty We have personally reviewed MRI of the brain without contrast in April 2017, there was no significant abnormality UPDATE Nov 8th 2017:Ashley Hanson is accompanied by her daughter at today's clinical visit, she reported recent onset of left side headache, left eye achy pain, overall has improved, today's Mini-Mental status examination 29/30  We have personally reviewed MRI brain in February 2017, generalized atrophy mostly at the left parasylvian fissure, slight supratentorium small vessel disease. She is able to tolerate Namenda 10 mg twice a day, Cymbalta 60 mg well, she lives by herself, just sold her car, is in the process of get scat service, continue have diffuse body joints pain, gait abnormality  UPDATE 05/16/2018CM Ashley Hanson , 78 year old female returns for follow-up She has a history of mild cognitive impairment and her MMSE today is 28 out of 30. Last was 29 out of 30. She is currently on Namenda and Aricept without side effects. She occasionally has word finding problems. She denies any recent falls she does ambulate with a cane. She has diffuse body joint pain. Previous history of right knee replacement needs left knee replacement. She was placed on Cymbalta at her last visit with Dr.Yan however she is not taking the medication.She claims that her headaches have stopped no headaches in over 3 months , she has stopped her Topamax. She returns for reevaluation  UPDATE 12/05/2017 Ashley Hanson, 78 year old female returns for follow-up history of mild cognitive impairment. She occasionally has some word finding problems. Her MMSE 26 today which is stable. She is currently on Namenda twice daily and Aricept  taking it at night. She complains with occasional hallucinations has not slept well. She was asked to take her Aricept in the morning. She has not had any recent falls uses a single-point cane to ambulate she is no longer having headaches and stopped her Topamax he also has stopped her Cymbalta that she was taking for knee pain. She says she is due to get a knee replacement in the summer. She returns for reevaluation UPDATE9/16/2019CMMs. Goodgame, 78 year old female returns for follow-up with history of mild cognitive impairment. She had hip surgery June 28, 2018 right hip replacement. She is currently receiving physical therapy in the home twice a week. She is seated in a wheelchair she continues to have some word finding issues. MMSE30/30.She is currently on Aricept and Namenda without side effects. No recent falls. Denies any headaches. Scores 5/6 on Ron Parker index for ADLs. 5/8 on IADLs.No new neurologic complaints she returns for reevaluation  ED follow-up 12/16/2018 : Ms. Fowle is a 78 year old female with history of dementia who presented to the ED on 11/21/2018 for altered mental status. She was brought via car after her granddaughter reports the patient had an episode where she couldn't open her eyes or get her words out lasting about 10 minutes. Apparently she was asleep in her chair, her granddaughter asked her a question and then noticed that the patient was slumped over in her chair and mumbling. She remembers trying to get her words out, but she couldn't. Finally, she opened her eyes and her speech came back. When she came to, she had a headache. She stood up and  got herself dressed. Her daughter then drove her to the ED The patient has good memory of this episode, specially feeling frustrated that she couldn't get her words out. She reports the day this episode occurred was a normal day and that she was feeling at her baseline. There was no weakness or facial droop. She does report  since then the right side of her face has "felt weird", only one time, but then subsided.  She reports last week, she was sitting in her chair. She felt a huge pain to middle of face "like it split her face". Then it went away. She reports her right eye has been leaking large drops of water, not very often. She has ringing in her ears and pain down the right side of her face. She reports having this right sided facial pain previously.   Memory, sometimes has good days and bad days. She notices sometimes she forgets what she wants to say. She is taking the Aricept and Namenda without side effect. She lives with her daughter. She presents today for ED follow-up  Update Today February 12 2019 SS: Ms. Nadal is a 78 year old female who presents for follow-up for possible TIA/seizure, to review her work-up results.  She has had a carotid ultrasound in February 2020 showed bilateral IC  less than 39% stenosis,  Anterograde flow at bilateral vertebral arteries.  She should continue taking aspirin 81 mg daily.  She had MRI of the brain in February 2020 that was normal, did not show an acute stroke.  EEG was normal. Echo was unremarkable.  I talked to Ms. Krall on the phone today.  She reports that she has been feeling well, has not had any new events.  She reports that she continues taking her daily aspirin, she actually takes 2 baby aspirin's.  She reports close follow-up with her primary care doctor, Dr. Nyoka Cowden.  She reports they regularly check her cholesterol, A1c.  She reports these findings have been normal.  She does report history of high blood pressure, taking propanolol, losartan daily.  She does not smoke.  She does not have any new problems or concerns.  She has been doing well, feeling well.  She just wants to discuss the results of her work-up.  Work up done in February 2020: MRI of the brain, EEG, Echo, Vascular US carotid Duplex MRI of the brain 01/08/2019 IMPRESSION:  Unremarkable MRI brain  (without). No acute findings.  Update July 02, 2019 SS:  REVIEW OF SYSTEMS: Out of a complete 14 system review of symptoms, the patient complains only of the following symptoms, and all other reviewed systems are negative.  ALLERGIES: Allergies  Allergen Reactions  . Adhesive [Tape] Dermatitis    Severe irritation of skin  . Plavix [Clopidogrel Bisulfate] Itching and Other (See Comments)    Causes migraines. Itching all over the body.  . Warfarin And Related Other (See Comments)    Blood thinners - cause migraines in patient.    HOME MEDICATIONS: Outpatient Medications Prior to Visit  Medication Sig Dispense Refill  . aspirin EC 81 MG tablet Take 1 tablet (81 mg total) by mouth 2 (two) times daily. 60 tablet 0  . Calcium Carbonate-Vit D-Min (CALCIUM 1200 PO) Take 1 tablet by mouth 2 (two) times daily.    . Cholecalciferol (VITAMIN D-3) 1000 units CAPS Take 1 tablet by mouth daily.     Marland Kitchen diltiazem (CARDIZEM CD) 120 MG 24 hr capsule Take 120 mg by mouth daily.     Marland Kitchen  docusate sodium (COLACE) 100 MG capsule Take 100 mg by mouth 2 (two) times daily.    Marland Kitchen donepezil (ARICEPT) 10 MG tablet Take 1 tablet (10 mg total) by mouth every morning. 90 tablet 3  . furosemide (LASIX) 40 MG tablet Take 120 mg by mouth daily.     Marland Kitchen gabapentin (NEURONTIN) 300 MG capsule Take 900 mg by mouth 3 (three) times daily.     . hypromellose (SYSTANE OVERNIGHT THERAPY) 0.3 % GEL ophthalmic ointment Place 1 application into both eyes at bedtime.    Marland Kitchen levothyroxine (SYNTHROID, LEVOTHROID) 50 MCG tablet Take 50 mcg by mouth daily before breakfast.    . losartan (COZAAR) 100 MG tablet Take 100 mg by mouth daily.     . memantine (NAMENDA) 10 MG tablet Take 1 tablet (10 mg total) by mouth 2 (two) times daily. 180 tablet 3  . Multiple Vitamins-Minerals (PRESERVISION AREDS PO) Take 1 capsule by mouth 2 (two) times daily.     Marland Kitchen oxyCODONE-acetaminophen (PERCOCET/ROXICET) 5-325 MG tablet Take 1 tablet by mouth every 4  (four) hours as needed for severe pain. 30 tablet 0  . potassium chloride (K-DUR,KLOR-CON) 10 MEQ tablet Take 10 mEq by mouth daily.    . propranolol (INDERAL) 60 MG tablet Take 1 tablet (60 mg total) by mouth 2 (two) times daily. 30 tablet 3  . propranolol (INDERAL) 80 MG tablet Take 80 mg by mouth 2 (two) times daily.    . sodium chloride (MURO 128) 5 % ophthalmic solution Place 1 drop into both eyes 4 (four) times daily.     Marland Kitchen tiZANidine (ZANAFLEX) 2 MG tablet Take 1 tablet (2 mg total) by mouth every 6 (six) hours as needed. 60 tablet 0   No facility-administered medications prior to visit.     PAST MEDICAL HISTORY: Past Medical History:  Diagnosis Date  . Anginal pain [facility])    sees [name]; reportedly negative stress/echo 2012  . Arthritis    "hands, elbows, shoulders, and back" (06/28/2018)  . Breast cancer, left breast ([place])   . Carpal tunnel syndrome    bilaterally  . Chronic lower back pain   . Complication of anesthesia    "difficutly waking up and hypothermia and stopped breathing"  . Confusion   . Dementia ([place])    "stage 4; w/both long term and short term memory loss" (06/28/2018)  . Depression   . Diverticulosis   . Esophageal reflux   . Hypertension    sees [name]  . Hypothyroidism   . IBS (irritable bowel syndrome)   . IBS (irritable bowel syndrome)   . Melanoma ([place])    2nd digit right hand; bottom of left foot" (06/28/2018)  . Migraines    "stopped having them 3-4 years ago" (06/28/2018)  . Myocardial infarction [facility])    "a mini one; early 2000s maybe"  . Osteoporosis   . PONV (postoperative nausea and vomiting)   . Refusal of blood transfusions as patient is Jehovah's Witness   . Scoliosis     PAST SURGICAL HISTORY: Past Surgical History:  Procedure Laterality Date  . ANKLE SURGERY Left 10/2007   "fell at work; don't know what they did to it"  . BREAST BIOPSY Left "X 2"  . BREAST LUMPECTOMY Left   . BREAST SURGERY Left    "removed scar  tissue"  . CARDIAC CATHETERIZATION  X 2  . CARDIOVASCULAR STRESS TEST    . CARPAL TUNNEL RELEASE Right 1995  . CARPAL TUNNEL RELEASE  Left   . CATARACT EXTRACTION W/ INTRAOCULAR LENS  IMPLANT, BILATERAL    . CHOLECYSTECTOMY  10/04/2012   Procedure: LAPAROSCOPIC CHOLECYSTECTOMY WITH INTRAOPERATIVE CHOLANGIOGRAM;  Surgeon: Adin Hector, MD;  Location: [facility];  Service: General;  Laterality: N/A;  laparosopic cholecystectomy with cholangiogram and repair of umbilical hernia  . COLONOSCOPY  08/30/11  . DILATION AND CURETTAGE OF UTERUS    . DOPPLER ECHOCARDIOGRAPHY     hx   . ERCP  10/05/2012   Procedure: ENDOSCOPIC RETROGRADE CHOLANGIOPANCREATOGRAPHY (ERCP);  Surgeon: Jeryl Columbia, MD;  [facility];  Service: Endoscopy;  Laterality: N/A;  . ESOPHAGOGASTRODUODENOSCOPY  08/30/11  . HERNIA REPAIR  10/02/2014   "in my stomach; took it out w/gallbladder"  . JOINT REPLACEMENT    . KELOID EXCISION Bilateral    "earlobes"  . KNEE ARTHROSCOPY Right 06/2007  . MELANOMA EXCISION Bilateral    2nd digit right hand; bottom of left foot" (06/28/2018)  . SHOULDER ARTHROSCOPY Right 06/2011  . SKIN GRAFT Right    "related to melanoma restricting my finger bending"  . TOTAL HIP ARTHROPLASTY Right 06/28/2018  . TOTAL HIP ARTHROPLASTY Right 06/28/2018   Procedure: RIGHT TOTAL HIP ARTHROPLASTY ANTERIOR APPROACH;  Surgeon: Frederik Pear, MD;  Location: [facility];  Service: Orthopedics;  Laterality: Right;  . TOTAL KNEE ARTHROPLASTY Right 08/2008  . TUBAL LIGATION      FAMILY HISTORY: Family History  Problem Relation Age of Onset  . Colon polyps Mother   . Breast cancer Sister   . Dementia Sister   . Sleep apnea Daughter     SOCIAL HISTORY: Social History   Socioeconomic History  . Marital status: Widowed    Spouse name: Not on file  . Number of children: 2  . Years of education: 21  . Highest education level: Not on file  Occupational History  . Occupation: Press photographer    Comment: Retired   Scientific laboratory technician  . Financial resource strain: Not on file  . Food insecurity    Worry: Not on file    Inability: Not on file  . Transportation needs    Medical: Not on file    Non-medical: Not on file  Tobacco Use  . Smoking status: Never Smoker  . Smokeless tobacco: Never Used  Substance and Sexual Activity  . Alcohol use: Not Currently  . Drug use: Never  . Sexual activity: Not Currently  Lifestyle  . Physical activity    Days per week: Not on file    Minutes per session: Not on file  . Stress: Not on file  Relationships  . Social Herbalist on phone: Not on file    Gets together: Not on file    Attends religious service: Not on file    Active member of club or organization: Not on file    Attends meetings of clubs or organizations: Not on file    Relationship status: Not on file  . Intimate partner violence    Fear of current or ex partner: Not on file    Emotionally abused: Not on file    Physically abused: Not on file    Forced sexual activity: Not on file  Other Topics Concern  . Not on file  Social History Narrative   Patient lives at home with her father and he is blind. Patient is retired. Patient is widowed and has two children.   Right handed.   Caffeine- None      PHYSICAL EXAM  There were no vitals filed for this visit. There is no height or weight on file to calculate BMI.  Generalized: Well developed, in no acute distress   Neurological examination  Mentation: Alert oriented to time, place, history taking. Follows all commands speech and language fluent Cranial nerve II-XII: Pupils were equal round reactive to light. Extraocular movements were full, visual field were full on confrontational test. Facial sensation and strength were normal. Uvula tongue midline. Head turning and shoulder shrug  were normal and symmetric. Motor: The motor testing reveals 5 over 5 strength of all 4 extremities. Good symmetric motor tone is noted throughout.   Sensory: Sensory testing is intact to soft touch on all 4 extremities. No evidence of extinction is noted.  Coordination: Cerebellar testing reveals good finger-nose-finger and heel-to-shin bilaterally.  Gait and station: Gait is normal. Tandem gait is normal. Romberg is negative. No drift is seen.  Reflexes: Deep tendon reflexes are symmetric and normal bilaterally.   DIAGNOSTIC DATA (LABS, IMAGING, TESTING) - I reviewed patient records, labs, notes, testing and imaging myself where available.  Lab Results  Component Value Date   WBC 7.2 11/21/2018   HGB 13.9 11/21/2018   HCT 41.0 11/21/2018   MCV 94.0 11/21/2018   PLT 166 11/21/2018      Component Value Date/Time   NA 143 11/21/2018 1841   NA 141 05/01/2018 1526   K 3.8 11/21/2018 1841   CL 108 11/21/2018 1841   CO2 25 11/21/2018 1826   GLUCOSE 96 11/21/2018 1841   BUN 8 11/21/2018 1841   BUN 15 05/01/2018 1526   CREATININE 0.70 11/21/2018 1841   CALCIUM 9.5 11/21/2018 1826   PROT 7.4 11/21/2018 1826   PROT 7.1 01/11/2016 1417   ALBUMIN 3.6 11/21/2018 1826   ALBUMIN 3.9 01/11/2016 1417   AST 31 11/21/2018 1826   ALT 30 11/21/2018 1826   ALKPHOS 150 (H) 11/21/2018 1826   BILITOT 0.6 11/21/2018 1826   BILITOT 0.3 01/11/2016 1417   GFRNONAA >60 11/21/2018 1826   GFRAA >60 11/21/2018 1826   No results found for: CHOL, HDL, LDLCALC, LDLDIRECT, TRIG, CHOLHDL No results found for: HGBA1C Lab Results  Component Value Date   VITAMINB12 980 (H) 01/11/2016   Lab Results  Component Value Date   TSH 5.070 (H) 05/01/2018      ASSESSMENT AND PLAN 78 y.o. year old female  has a past medical history of Anginal pain ([place]), Arthritis, Breast cancer, left breast ([place]), Carpal tunnel syndrome, Chronic lower back pain, Complication of anesthesia, Confusion, Dementia ([place]), Depression, Diverticulosis, Esophageal reflux, Hypertension, Hypothyroidism, IBS (irritable bowel syndrome), IBS (irritable bowel syndrome), Melanoma ([place]),  Migraines, Myocardial infarction ([place]), Osteoporosis, PONV (postoperative nausea and vomiting), Refusal of blood transfusions as patient is Jehovah's Witness, and Scoliosis. here with ***   I spent 15 minutes with the patient. 50% of this time was spent   Butler Denmark, [place], DNP 07/01/2019, 9:42 PM [facility] [address] [place], [place] [address] [phone]

## 2019-07-02 ENCOUNTER — Encounter: Payer: Self-pay | Admitting: Neurology

## 2019-07-02 ENCOUNTER — Ambulatory Visit (INDEPENDENT_AMBULATORY_CARE_PROVIDER_SITE_OTHER): Payer: Medicare Other | Admitting: Neurology

## 2019-07-02 ENCOUNTER — Other Ambulatory Visit: Payer: Self-pay

## 2019-07-02 VITALS — BP 183/95 | HR 64 | Temp 95.9°F | Ht 65.0 in | Wt 248.6 lb

## 2019-07-02 DIAGNOSIS — R404 Transient alteration of awareness: Secondary | ICD-10-CM

## 2019-07-02 DIAGNOSIS — G3184 Mild cognitive impairment, so stated: Secondary | ICD-10-CM

## 2019-07-02 MED ORDER — MEMANTINE HCL 10 MG PO TABS: 10.0000 mg | ORAL_TABLET | Freq: Two times a day (BID) | ORAL | 3 refills | Status: AC

## 2019-07-02 MED ORDER — DONEPEZIL HCL 10 MG PO TABS: 10.0000 mg | ORAL_TABLET | ORAL | 3 refills | Status: AC

## 2019-07-02 NOTE — Patient Instructions (Signed)
Please continue Namenda and Aricept, along with daily 81 mg aspirin.

## 2019-07-02 NOTE — Progress Notes (Signed)
PATIENT: Ashley Hanson DOB: [birth year]  REASON FOR VISIT: follow up HISTORY FROM: patient  HISTORY OF PRESENT ILLNESS: Today 07/02/19  HISTORY  Ashley Harperis a 78 years old right-handed female, last clinical visit was with Dr. Jim Like August 2015 previously a patient of Dr. Erling Cruz  She had a history of chronic ocular migraine the past, presented with colorful visual distortion, oftentimes followed by retro-orbital area headaches, when she was younger she has headaches frequently, sometimes the point of passing out, her headache has much improved, especially while take gabapentin 600 mg twice a day, since September 2016, she began to have frequent almost daily pressure behind both eyes, also complains of decreased vision, she has tried over-the-counter ibuprofen Tylenol did not work for her,  She had a history of macular degeneration with decreased vision to begin with, also had bilateral cataract surgery, recent diagnosis of glaucoma by ophthalmologist Dr. Carolynn Sayers She complains of many years history of gradual onset memory trouble, she retired as a Therapist, occupational, she still able to drive, lives in apartment by herself, manage her whole own finances, Gait difficulty due to bilateral feet pain, scoliosis, chronic low back pain.  UPDATE Nov 01 2015:YYI reviewed a note from her ophthalmologist Dr. Katy Fitch dated October 28 2015, patient complains of visual distortions of shapes and sizes, movement of visual imagings, Funduscopy examination showed normal appearance of optic nerve with healthy pink cream and normal appearance nerve fiber layers, macular showed dry age-related macular degeneration, vessels are normal, vitreous was normal, The conclusion was suspected glaucoma, OD was 12, OS was 14, reported that her father had a history of glaucoma She was put on 3 eye drops, she has not started yet, she also had bilateral conjunctiva erythematous change, was diagnosed with recurrent  iridocystitis Previous laboratory evaluation showed elevated C reactive protein of 21, with normal ESR, She wear sunglasses, she complains of light sensitive, she continues both retro-orbital area pressure pain, constant 9/10, x24 hours. Topamax 189m bid did not help her headaches  UPDATE Nov 18 2015:YY Her headache and conjunctiva erythematous has much improved, everytime she took Fioricet, she had GI side effect, feel sick, band like sensation. She was started on prednisone since December twelfth 2016, started from 20 mg daily, now 10 milligrams daily, she can tolerate the medication well, which has helped her headaches, she is also on higher dose of Topamax 100 mg twice a day, Gabapentin up to 600 mg twice a day did not help her headaches, or her bilateral lower extremity paresthesia pain, will change to Lyrica today, Previous laboratory showed elevated C reactive protein up to 20.9, her headache has much improved with prednisone, likely a component of inflammatory process  UPDATE Dec 28 2015:YY Her headache, and eye pain has much improved, with no recurrent headache after tapering off prednisone, she is now taking Topamax 100 mg twice a day, she is complaining of increased memory trouble, difficulty focusing, She lives by herself since [id number] her husband passed away, she graduated from high school, used to work as a sMuseum/gallery curator retired around age 78 she has strong family history of Alzheimer's disease, her maternal grandmother, mother, maternal aunt, sister suffered Alzheimer's disease  UPDATE May 8th 2017:YYI reviewed laboratory evaluation, she has elevated ESR of 50, previous elevated C reactive protein up to 21 in Nov 2016, frequent headaches, responded very well to prednison, she is off steroid now, has no recurrent headache,   I have talked about potential temporal artery biopsy  to rule out temporal arteritis, she adamantly refused, she suffered severe keloid, with  associated neuropathic pain, She is taking Topamax 50 mg twice a day, overall tolerating it well, she no longer has frequent migraine headaches, She complains of diffuse body achy pain multiple joints pain, neuropathic pain around her keloid, worsening scoliosis, and worsening gait difficulty We have personally reviewed MRI of the brain without contrast in April 2017, there was no significant abnormality UPDATE Nov 8th 2017:Ashley Hanson is accompanied by her daughter at today's clinical visit, she reported recent onset of left side headache, left eye achy pain, overall has improved, today's Mini-Mental status examination 29/30  We have personally reviewed MRI brain in February 2017, generalized atrophy mostly at the left parasylvian fissure, slight supratentorium small vessel disease. She is able to tolerate Namenda 10 mg twice a day, Cymbalta 60 mg well, she lives by herself, just sold her car, is in the process of get scat service, continue have diffuse body joints pain, gait abnormality  UPDATE 05/16/2018CM Ashley Hanson , 78 year old female returns for follow-up She has a history of mild cognitive impairment and her MMSE today is 28 out of 30. Last was 29 out of 30. She is currently on Namenda and Aricept without side effects. She occasionally has word finding problems. She denies any recent falls she does ambulate with a cane. She has diffuse body joint pain. Previous history of right knee replacement needs left knee replacement. She was placed on Cymbalta at her last visit with Dr.Yan however she is not taking the medication.She claims that her headaches have stopped no headaches in over 3 months , she has stopped her Topamax. She returns for reevaluation  UPDATE 12/05/2017 Ashley Hanson, 78 year old female returns for follow-up history of mild cognitive impairment. She occasionally has some word finding problems. Her MMSE 26 today which is stable. She is currently on Namenda twice daily and Aricept  taking it at night. She complains with occasional hallucinations has not slept well. She was asked to take her Aricept in the morning. She has not had any recent falls uses a single-point cane to ambulate she is no longer having headaches and stopped her Topamax he also has stopped her Cymbalta that she was taking for knee pain. She says she is due to get a knee replacement in the summer. She returns for reevaluation UPDATE9/16/2019CMMs. Hanson, 78 year old female returns for follow-up with history of mild cognitive impairment. She had hip surgery June 28, 2018 right hip replacement. She is currently receiving physical therapy in the home twice a week. She is seated in a wheelchair she continues to have some word finding issues. MMSE30/30.She is currently on Aricept and Namenda without side effects. No recent falls. Denies any headaches. Scores 5/6 on Ron Parker index for ADLs. 5/8 on IADLs.No new neurologic complaints she returns for reevaluation  ED follow-up 12/16/2018 : Ashley Hanson is a 78 year old female with history of dementia who presented to the ED on 11/21/2018 for altered mental status. She was brought via car after her granddaughter reports the patient had an episode where she couldn't open her eyes or get her words out lasting about 10 minutes. Apparently she was asleep in her chair, her granddaughter asked her a question and then noticed that the patient was slumped over in her chair and mumbling. She remembers trying to get her words out, but she couldn't. Finally, she opened her eyes and her speech came back. When she came to, she had a headache. She stood up and  got herself dressed. Her daughter then drove her to the ED The patient has good memory of this episode, specially feeling frustrated that she couldn't get her words out. She reports the day this episode occurred was a normal day and that she was feeling at her baseline. There was no weakness or facial droop. She does report  since then the right side of her face has "felt weird", only one time, but then subsided.  She reports last week, she was sitting in her chair. She felt a huge pain to middle of face "like it split her face". Then it went away. She reports her right eye has been leaking large drops of water, not very often. She has ringing in her ears and pain down the right side of her face. She reports having this right sided facial pain previously.   Memory, sometimes has good days and bad days. She notices sometimes she forgets what she wants to say. She is taking the Aricept and Namenda without side effect. She lives with her daughter. She presents today for ED follow-up  Update Today February 12 2019 SS: Ashley Hanson is a 78 year old female who presents for follow-up for possible TIA/seizure, to review her work-up results.  She has had a carotid ultrasound in February 2020 showed bilateral IC  less than 39% stenosis,  Anterograde flow at bilateral vertebral arteries.  She should continue taking aspirin 81 mg daily.  She had MRI of the brain in February 2020 that was normal, did not show an acute stroke.  EEG was normal. Echo was unremarkable.  I talked to Ashley Hanson on the phone today.  She reports that she has been feeling well, has not had any new events.  She reports that she continues taking her daily aspirin, she actually takes 2 baby aspirin's.  She reports close follow-up with her primary care doctor, Dr. Nyoka Cowden.  She reports they regularly check her cholesterol, A1c.  She reports these findings have been normal.  She does report history of high blood pressure, taking propanolol, losartan daily.  She does not smoke.  She does not have any new problems or concerns.  She has been doing well, feeling well.  She just wants to discuss the results of her work-up.  Work up done in February 2020: MRI of the brain, EEG, Echo, Vascular US carotid Duplex MRI of the brain 01/08/2019 IMPRESSION:  Unremarkable MRI brain  (without). No acute findings.  Update July 02, 2019 SS: Here with her daughter, she thinks more problems with memory, forgets more. She lives with family. She forgets her medications, it bothers her that she forget things. She may watch TV show, forget parts of the show. Does her ADLs, has good appetite. She has fallen twice, clumsy, tripped over box, slipped in bathroom. Had to call EMS to get her up, but wasn't injured. Remains on aricept, namenda, and aspirin. No further events of passing out/zoning out. Uses a walker at home. She denies headaches. Right leg weaker than left b/c hip replacement, knee surgery. Is try to get a wheelchair. 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  REVIEW OF SYSTEMS: Out of a complete 14 system review of symptoms, the patient complains only of the following symptoms, and all other reviewed systems are negative.  Memory loss, falls  ALLERGIES: Allergies  Allergen Reactions   Adhesive [Tape] Dermatitis    Severe irritation of skin   Plavix [Clopidogrel Bisulfate] Itching and Other (See Comments)    Causes  migraines. Itching all over the body.   Warfarin And Related Other (See Comments)    Blood thinners - cause migraines in patient.    HOME MEDICATIONS: Outpatient Medications Prior to Visit  Medication Sig Dispense Refill   aspirin EC 81 MG tablet Take 1 tablet (81 mg total) by mouth 2 (two) times daily. 60 tablet 0   Calcium Carbonate-Vit D-Min (CALCIUM 1200 PO) Take 1 tablet by mouth 2 (two) times daily.     Cholecalciferol (VITAMIN D-3) 1000 units CAPS Take 1 tablet by mouth daily.      diltiazem (CARDIZEM CD) 120 MG 24 hr capsule Take 120 mg by mouth daily.      docusate sodium (COLACE) 100 MG capsule Take 100 mg by mouth 2 (two) times daily.     donepezil (ARICEPT) 10 MG tablet Take 1 tablet (10 mg total) by mouth every morning. 90 tablet 3   furosemide (LASIX) 40 MG tablet Take 120 mg by mouth daily.      gabapentin (NEURONTIN)  300 MG capsule Take 900 mg by mouth 2 (two) times daily.      hypromellose (SYSTANE OVERNIGHT THERAPY) 0.3 % GEL ophthalmic ointment Place 1 application into both eyes at bedtime.     levothyroxine (SYNTHROID, LEVOTHROID) 50 MCG tablet Take 50 mcg by mouth daily before breakfast.     losartan (COZAAR) 100 MG tablet Take 100 mg by mouth daily.      memantine (NAMENDA) 10 MG tablet Take 1 tablet (10 mg total) by mouth 2 (two) times daily. 180 tablet 3   Multiple Vitamins-Minerals (PRESERVISION AREDS PO) Take 1 capsule by mouth 2 (two) times daily.      oxyCODONE-acetaminophen (PERCOCET/ROXICET) 5-325 MG tablet Take 1 tablet by mouth every 4 (four) hours as needed for severe pain. 30 tablet 0   potassium chloride (K-DUR,KLOR-CON) 10 MEQ tablet Take 10 mEq by mouth daily.     propranolol (INDERAL) 80 MG tablet Take 80 mg by mouth 2 (two) times daily.     sodium chloride (MURO 128) 5 % ophthalmic solution Place 1 drop into both eyes 4 (four) times daily.      tiZANidine (ZANAFLEX) 2 MG tablet Take 1 tablet (2 mg total) by mouth every 6 (six) hours as needed. 60 tablet 0   UNABLE TO FIND Take 1 tablet by mouth 2 (two) times daily. Med Name: NerveRenew     propranolol (INDERAL) 60 MG tablet Take 1 tablet (60 mg total) by mouth 2 (two) times daily. (Patient not taking: Reported on 07/02/2019) 30 tablet 3   No facility-administered medications prior to visit.     PAST MEDICAL HISTORY: Past Medical History:  Diagnosis Date   Anginal pain [facility])    sees [name]; reportedly negative stress/echo 2012   Arthritis    "hands, elbows, shoulders, and back" (06/28/2018)   Breast cancer, left breast (HCC)    Carpal tunnel syndrome    bilaterally   Chronic lower back pain    Complication of anesthesia    "difficutly waking up and hypothermia and stopped breathing"   Confusion    Dementia ([place])    "stage 4; w/both long term and short term memory loss" (06/28/2018)   Depression     Diverticulosis    Esophageal reflux    Hypertension    sees [name]   Hypothyroidism    IBS (irritable bowel syndrome)    IBS (irritable bowel syndrome)    Melanoma ([place])    2nd digit  right hand; bottom of left foot" (06/28/2018)   Migraines    "stopped having them 3-4 years ago" (06/28/2018)   Myocardial infarction [facility])    "a mini one; early 2000s maybe"   Osteoporosis    PONV (postoperative nausea and vomiting)    Refusal of blood transfusions as patient is Jehovah's Witness    Scoliosis     PAST SURGICAL HISTORY: Past Surgical History:  Procedure Laterality Date   ANKLE SURGERY Left 10/2007   "fell at work; don't know what they did to it"   BREAST BIOPSY Left "X 2"   BREAST LUMPECTOMY Left    BREAST SURGERY Left    "removed scar tissue"   CARDIAC CATHETERIZATION  X 2   CARDIOVASCULAR STRESS TEST     CARPAL TUNNEL RELEASE Right 1995   CARPAL TUNNEL RELEASE Left    CATARACT EXTRACTION W/ INTRAOCULAR LENS  IMPLANT, BILATERAL     CHOLECYSTECTOMY  10/04/2012   Procedure: LAPAROSCOPIC CHOLECYSTECTOMY WITH INTRAOPERATIVE CHOLANGIOGRAM;  Surgeon: Adin Hector, MD;  Location: [place];  Service: General;  Laterality: N/A;  laparosopic cholecystectomy with cholangiogram and repair of umbilical hernia   COLONOSCOPY  08/30/11   DILATION AND CURETTAGE OF UTERUS     DOPPLER ECHOCARDIOGRAPHY     hx    ERCP  10/05/2012   Procedure: ENDOSCOPIC RETROGRADE CHOLANGIOPANCREATOGRAPHY (ERCP);  Surgeon: Jeryl Columbia, MD;  Location[facility] ENDOSCOPY;  Service: Endoscopy;  Laterality: N/A;   ESOPHAGOGASTRODUODENOSCOPY  08/30/11   HERNIA REPAIR  10/02/2014   "in my stomach; took it out w/gallbladder"   [place] Bilateral    "earlobes"   KNEE ARTHROSCOPY Right 06/2007   MELANOMA EXCISION Bilateral    2nd digit right hand; bottom of left foot" (06/28/2018)   SHOULDER ARTHROSCOPY Right 06/2011   SKIN GRAFT Right    "related to  melanoma restricting my finger bending"   TOTAL HIP ARTHROPLASTY Right 06/28/2018   TOTAL HIP ARTHROPLASTY Right 06/28/2018   Procedure: RIGHT TOTAL HIP ARTHROPLASTY ANTERIOR APPROACH;  Surgeon: Frederik Pear, MD;  Location: [place];  Service: Orthopedics;  Laterality: Right;   TOTAL KNEE ARTHROPLASTY Right 08/2008   TUBAL LIGATION      FAMILY HISTORY: Family History  Problem Relation Age of Onset   Colon polyps Mother    Breast cancer Sister    Dementia Sister    Sleep apnea Daughter     SOCIAL HISTORY: Social History   Socioeconomic History   Marital status: Widowed    Spouse name: Not on file   Number of children: 2   Years of education: 12   Highest education level: Not on file  Occupational History   Occupation: Press photographer    Comment: Retired  Scientist, product/process development strain: Not on file   Food insecurity    Worry: Not on file    Inability: Not on Lexicographer needs    Medical: Not on file    Non-medical: Not on file  Tobacco Use   Smoking status: Never Smoker   Smokeless tobacco: Never Used  Substance and Sexual Activity   Alcohol use: Not Currently   Drug use: Never   Sexual activity: Not Currently  Lifestyle   Physical activity    Days per week: Not on file    Minutes per session: Not on file   Stress: Not on file  Relationships   Social connections    Talks on phone: Not on file  Gets together: Not on file    Attends religious service: Not on file    Active member of club or organization: Not on file    Attends meetings of clubs or organizations: Not on file    Relationship status: Not on file   Intimate partner violence    Fear of current or ex partner: Not on file    Emotionally abused: Not on file    Physically abused: Not on file    Forced sexual activity: Not on file  Other Topics Concern   Not on file  Social History Narrative   Patient lives at home with her father and he is blind. Patient is retired.  Patient is widowed and has two children.   Right handed.   Caffeine- None    PHYSICAL EXAM  There were no vitals filed for this visit. There is no height or weight on file to calculate BMI.  Generalized: Well developed, in no acute distress  MMSE - Mini Mental State Exam 07/02/2019 08/05/2018 12/05/2017  Not completed: (No Data) - -  Orientation to time _0 Orientation to Place _1 Registration _2 Attention/ Calculation _3 Recall _4 Language- name 2 objects _5 Language- repeat 1 1 0  Language- follow 3 step command _6 Language- read & follow direction _7 Write a sentence _8 Copy design 0 1 1  Total score _9 Neurological examination  Mentation: Alert oriented to time, place, history taking. Follows all commands speech and language fluent Cranial nerve II-XII: Pupils were equal round reactive to light. Extraocular movements were full, visual field were full on confrontational test. Facial sensation and strength were normal. Head turning and shoulder shrug  were normal and symmetric. Motor: The motor testing reveals 4 over 5 strength of all 4 extremities. Good symmetric motor tone is noted throughout.  Sensory: Sensory testing is intact to soft touch on all 4 extremities. No evidence of extinction is noted.  Coordination: Cerebellar testing reveals good finger-nose-finger bilaterally, unable to perform heel-to-shin Gait and station: Gait is unsteady, only able to take a few steps, using wheelchair Reflexes: Deep tendon reflexes are symmetric but decreased bilaterally  DIAGNOSTIC DATA (LABS, IMAGING, TESTING) - I reviewed patient records, labs, notes, testing and imaging myself where available.  Lab Results  Component Value Date   WBC 7.2 11/21/2018   HGB 13.9 11/21/2018   HCT 41.0 11/21/2018   MCV 94.0 11/21/2018   PLT 166 11/21/2018      Component Value Date/Time   NA 143 11/21/2018 1841   NA 141 05/01/2018 1526   K 3.8  11/21/2018 1841   CL 108 11/21/2018 1841   CO2 25 11/21/2018 1826   GLUCOSE 96 11/21/2018 1841   BUN 8 11/21/2018 1841   BUN 15 05/01/2018 1526   CREATININE 0.70 11/21/2018 1841   CALCIUM 9.5 11/21/2018 1826   PROT 7.4 11/21/2018 1826   PROT 7.1 01/11/2016 1417   ALBUMIN 3.6 11/21/2018 1826   ALBUMIN 3.9 01/11/2016 1417   AST 31 11/21/2018 1826   ALT 30 11/21/2018 1826   ALKPHOS 150 (H) 11/21/2018 1826   BILITOT 0.6 11/21/2018 1826   BILITOT 0.3 01/11/2016 1417   GFRNONAA >60 11/21/2018 1826   GFRAA >60 11/21/2018 1826   No results found for: CHOL, HDL, LDLCALC, LDLDIRECT, TRIG, CHOLHDL No results found for:  HGBA1C Lab Results  Component Value Date   VITAMINB12 980 (H) 01/11/2016   Lab Results  Component Value Date   TSH 5.070 (H) 05/01/2018    ASSESSMENT AND PLAN 78 y.o. year old female  has a past medical history of Anginal pain ([place]), Arthritis, Breast cancer, left breast ([place]), Carpal tunnel syndrome, Chronic lower back pain, Complication of anesthesia, Confusion, Dementia ([place]), Depression, Diverticulosis, Esophageal reflux, Hypertension, Hypothyroidism, IBS (irritable bowel syndrome), IBS (irritable bowel syndrome), Melanoma ([place]), Migraines, Myocardial infarction ([place]), Osteoporosis, PONV (postoperative nausea and vomiting), Refusal of blood transfusions as patient is Jehovah's Witness, and Scoliosis. here with:  1.  Memory loss -Slight decline in her memory score, was 25/30 today -She will continue taking Aricept and Namenda -I encouraged healthy eating, drinking plenty of water, exercise as tolerated, brain stimulating exercises  2.  Transient loss of awareness, work-up possible seizure/TIA -She has not had any further events -She remains on daily 81 mg aspirin -EEG was normal -Echocardiogram unremarkable -MRI of the brain February 2020 was unremarkable -Carotid ultrasound February 2020 showed bilateral IC less than 39% stenosis, anterior grade flow at bilateral  vertebral arteries  She will follow-up in 6 months or sooner if needed   I spent 25 minutes with the patient. 50% of this time was spent discussing her plan of care.   Butler Denmark, AGNP-C, DNP 07/02/2019, 3:29 PM [facility] [address], [place], [place] [address] [phone]

## 2019-07-02 NOTE — Progress Notes (Signed)
I have reviewed and agreed above plan. 

## 2019-09-08 ENCOUNTER — Other Ambulatory Visit: Payer: Self-pay | Admitting: Internal Medicine

## 2019-09-08 DIAGNOSIS — Z1231 Encounter for screening mammogram for malignant neoplasm of breast: Secondary | ICD-10-CM

## 2019-11-11 ENCOUNTER — Other Ambulatory Visit: Payer: Self-pay | Admitting: Internal Medicine

## 2019-11-11 ENCOUNTER — Ambulatory Visit: Payer: Medicare Other | Admitting: Sports Medicine

## 2019-11-11 DIAGNOSIS — I890 Lymphedema, not elsewhere classified: Secondary | ICD-10-CM

## 2019-11-20 ENCOUNTER — Ambulatory Visit
Admission: RE | Admit: 2019-11-20 | Discharge: 2019-11-20 | Disposition: A | Discharge status: Home / Self Care | Payer: Medicare Other | Source: Ambulatory Visit | Attending: Internal Medicine | Admitting: Internal Medicine

## 2019-11-20 ENCOUNTER — Other Ambulatory Visit: Payer: Self-pay

## 2019-11-20 ENCOUNTER — Other Ambulatory Visit: Payer: Medicare Other

## 2019-11-20 DIAGNOSIS — I890 Lymphedema, not elsewhere classified: Secondary | ICD-10-CM

## 2019-11-20 DIAGNOSIS — Z1231 Encounter for screening mammogram for malignant neoplasm of breast: Secondary | ICD-10-CM

## 2019-11-20 MED ORDER — IOPAMIDOL (ISOVUE-300) INJECTION 61%
100.0000 mL | Freq: Once | INTRAVENOUS | Status: AC | PRN
  Administered 2019-11-20: 125 mL via INTRAVENOUS

## 2019-12-02 ENCOUNTER — Other Ambulatory Visit: Payer: Self-pay | Admitting: Internal Medicine

## 2019-12-02 DIAGNOSIS — N63 Unspecified lump in unspecified breast: Secondary | ICD-10-CM

## 2019-12-04 ENCOUNTER — Other Ambulatory Visit (HOSPITAL_COMMUNITY): Payer: Self-pay | Admitting: Internal Medicine

## 2019-12-04 DIAGNOSIS — R600 Localized edema: Secondary | ICD-10-CM

## 2019-12-04 DIAGNOSIS — R209 Unspecified disturbances of skin sensation: Secondary | ICD-10-CM

## 2019-12-05 ENCOUNTER — Other Ambulatory Visit: Payer: Self-pay

## 2019-12-05 ENCOUNTER — Ambulatory Visit (HOSPITAL_BASED_OUTPATIENT_CLINIC_OR_DEPARTMENT_OTHER)
Admission: RE | Admit: 2019-12-05 | Discharge: 2019-12-05 | Disposition: A | Discharge status: Home / Self Care | Payer: Medicare Other | Source: Ambulatory Visit

## 2019-12-05 ENCOUNTER — Ambulatory Visit (HOSPITAL_COMMUNITY)
Admission: RE | Admit: 2019-12-05 | Discharge: 2019-12-05 | Disposition: A | Discharge status: Home / Self Care | Payer: Medicare Other | Source: Ambulatory Visit | Attending: Internal Medicine | Admitting: Internal Medicine

## 2019-12-05 DIAGNOSIS — R209 Unspecified disturbances of skin sensation: Secondary | ICD-10-CM | POA: Diagnosis not present

## 2019-12-05 DIAGNOSIS — R52 Pain, unspecified: Secondary | ICD-10-CM | POA: Diagnosis not present

## 2019-12-05 DIAGNOSIS — R600 Localized edema: Secondary | ICD-10-CM | POA: Insufficient documentation

## 2019-12-05 NOTE — Progress Notes (Signed)
Bilateral lower extremity venous duplex and ABI's have been completed. Preliminary results can be found in CV Proc through chart review.  Results were given to Dr. Nyoka Cowden.  12/05/19 3:16 PM Carlos Levering RVT

## 2019-12-15 IMAGING — CT CT HEAD W/O CM
3 series · 16 of 47 positions shown, 19 images · non-contrast
Comparison: MRI 01/04/2016, CT brain 07/30/2010

CLINICAL DATA: Altered mental status difficulty waking up

EXAM:
CT HEAD WITHOUT CONTRAST
TECHNIQUE: Contiguous axial images were obtained from the base of the skull
through the vertex without intravenous contrast.

[Series 2: head wo · axial · 0.47mm/px · z∈[-129,+16]mm · 10 of 35 slices shown, 13 images]
[im 3/35  brain]
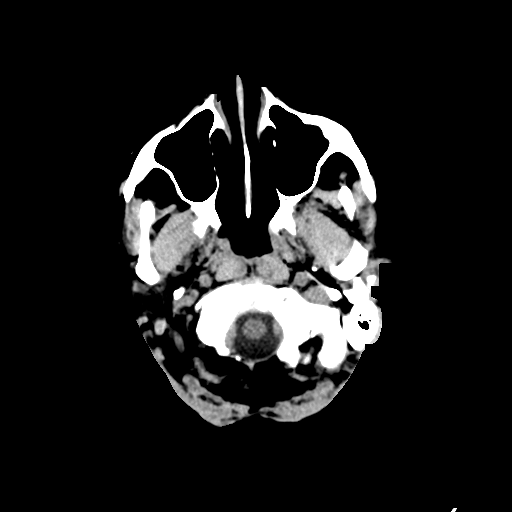
[im 3/35  bone]
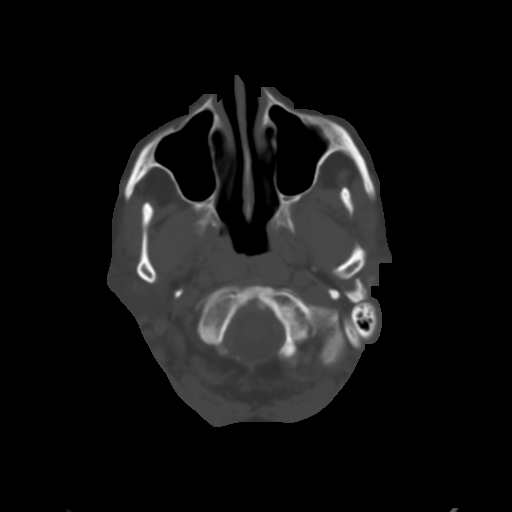
[im 6/35  brain]
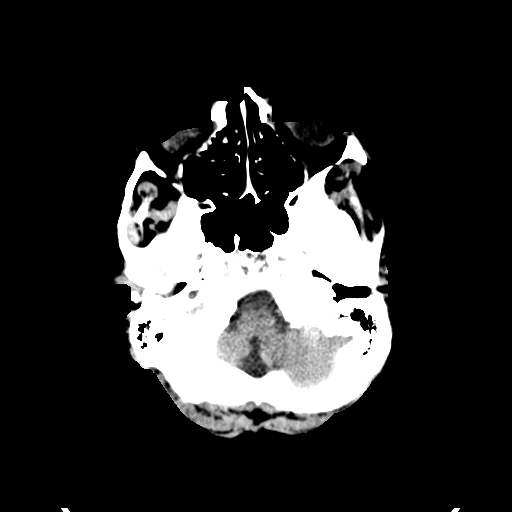
[im 10/35  brain]
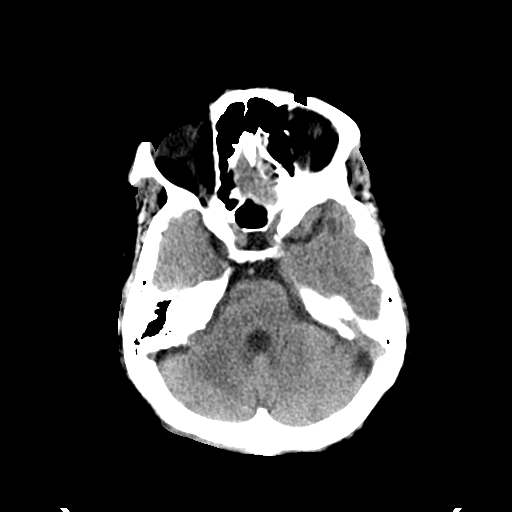
[im 12/35  brain]
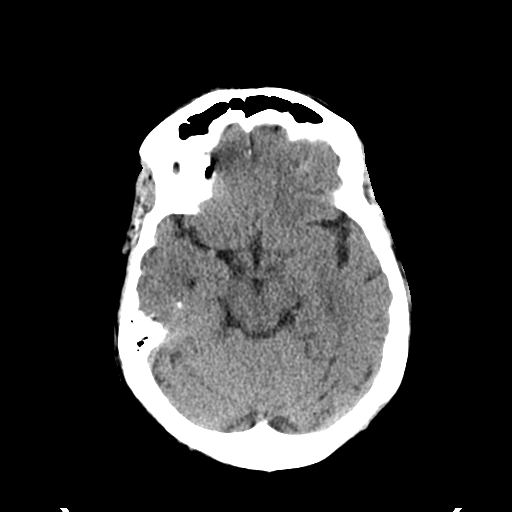
[im 16/35  brain]
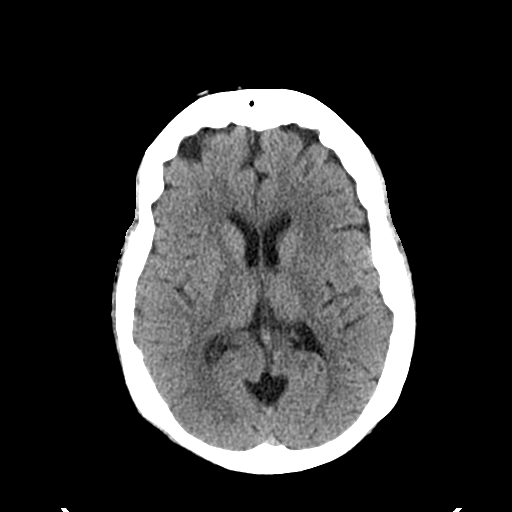
[im 16/35  bone]
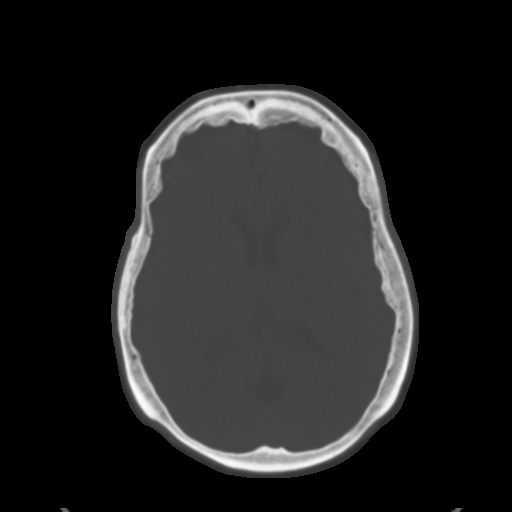
[im 19/35  brain]
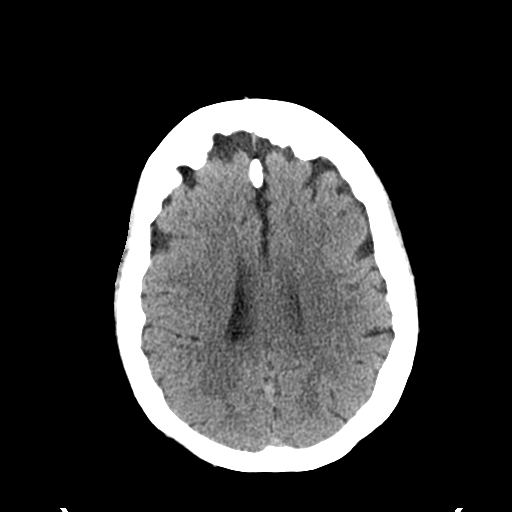
[im 23/35  brain]
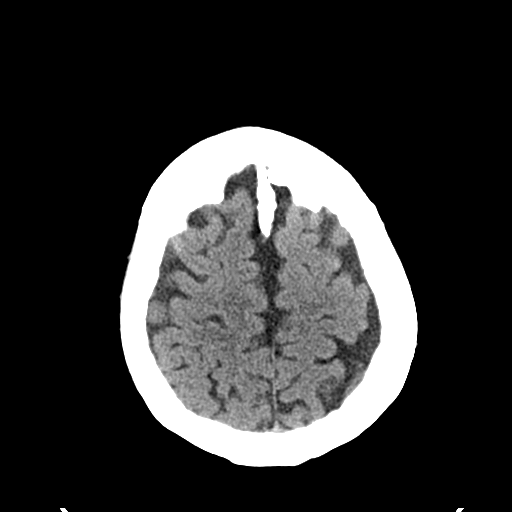
[im 26/35  brain]
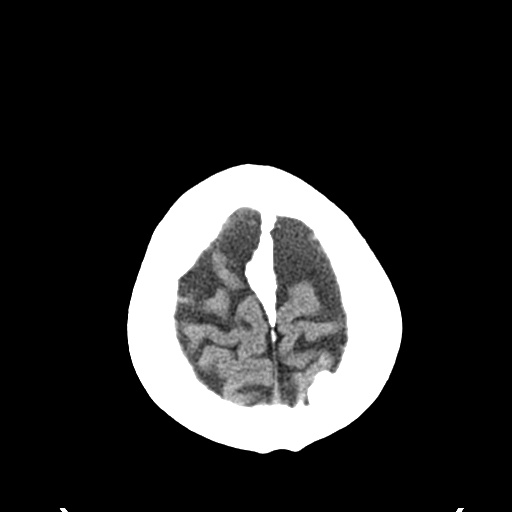
[im 29/35  brain]
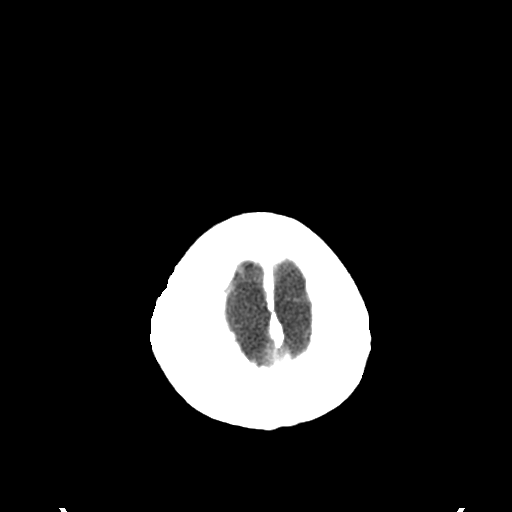
[im 29/35  bone]
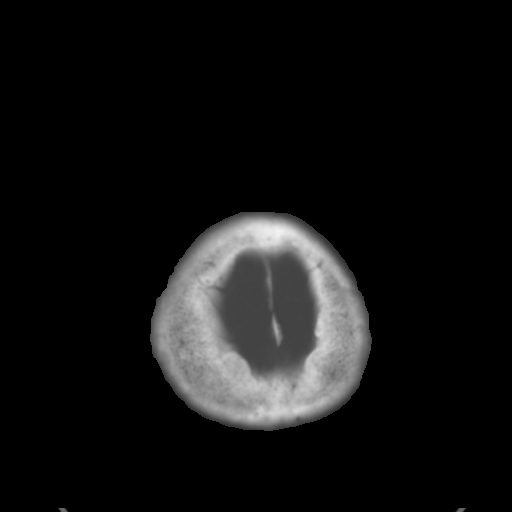
[im 32/35  brain]
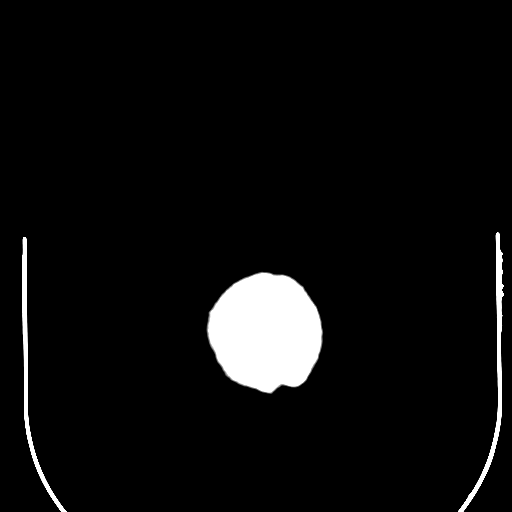

[Series 5: coronal soft tissue · coronal · 0.33mm/px · 3 of 67 slices shown]
[im 23/67  brain]
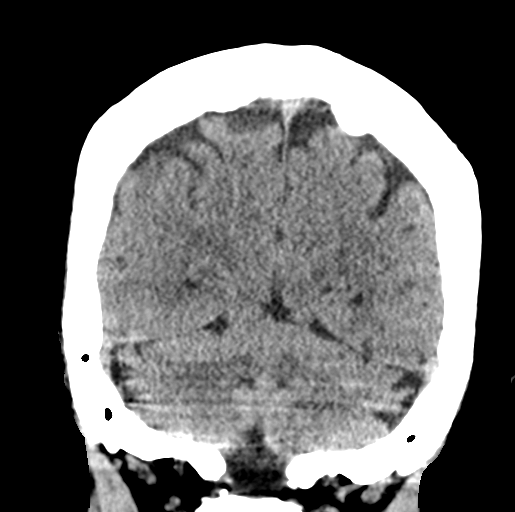
[im 30/67  brain]
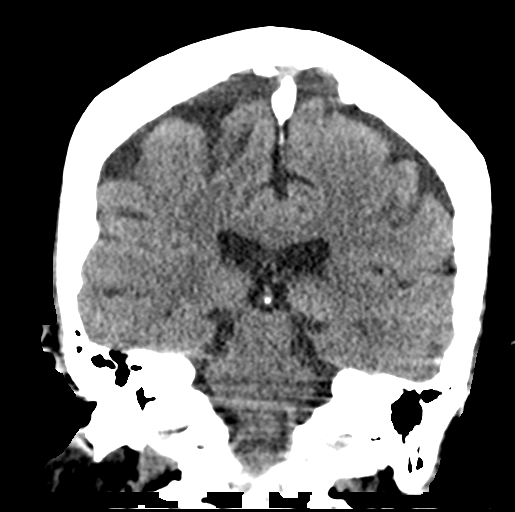
[im 37/67  brain]
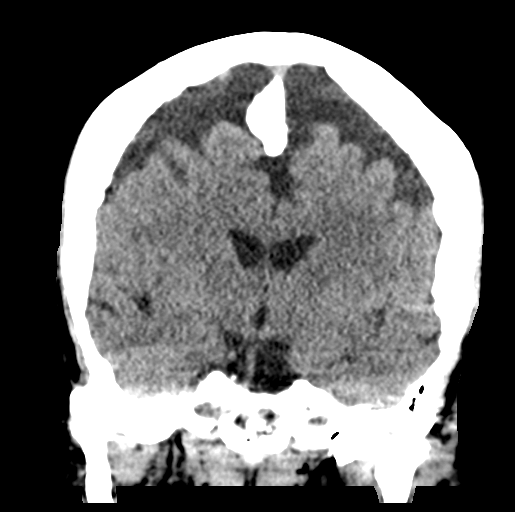

[Series 6: sagittal soft tissue · sagittal · 0.33mm/px · 3 of 53 slices shown]
[im 18/53  brain]
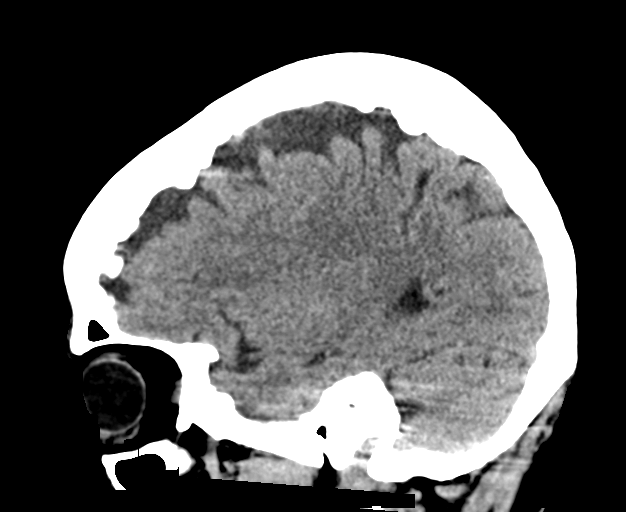
[im 27/53  brain]
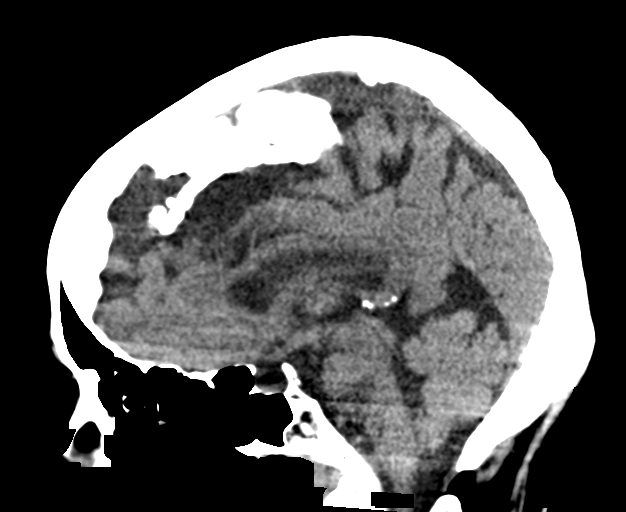
[im 35/53  brain]
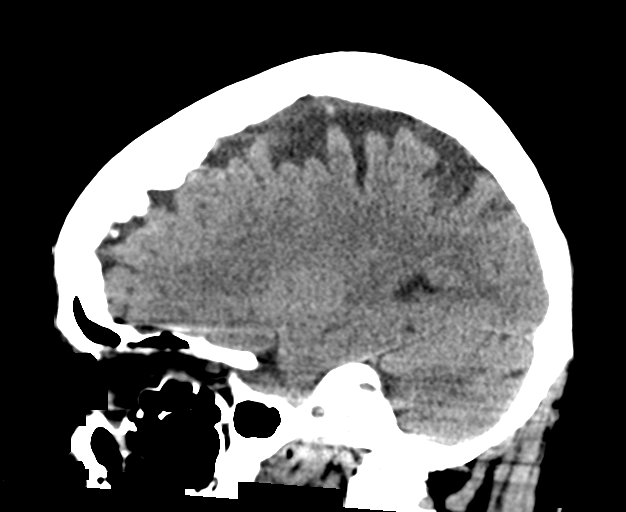

[16 of 47 positions shown; findings below may reference images not displayed]

FINDINGS: Brain: No acute territorial infarction, hemorrhage or intracranial
mass. Prominent parafalcine calcification. Mild to moderate atrophy.
Stable ventricle size

Vascular: No hyperdense vessels. Scattered calcifications at the
carotid siphons

Skull: Normal. Negative for fracture or focal lesion.

Sinuses/Orbits: No acute finding.

Other: None
IMPRESSION: 1. No CT evidence for acute intracranial abnormality.
2. Atrophy

## 2019-12-15 IMAGING — CR DG PELVIS 1-2V
1 series · 1 of 1 positions shown · non-contrast
Comparison: CT 07/23/2012

CLINICAL DATA: Left greater than right back pain

EXAM:
PELVIS - 1-2 VIEW

[t pelvis ap]
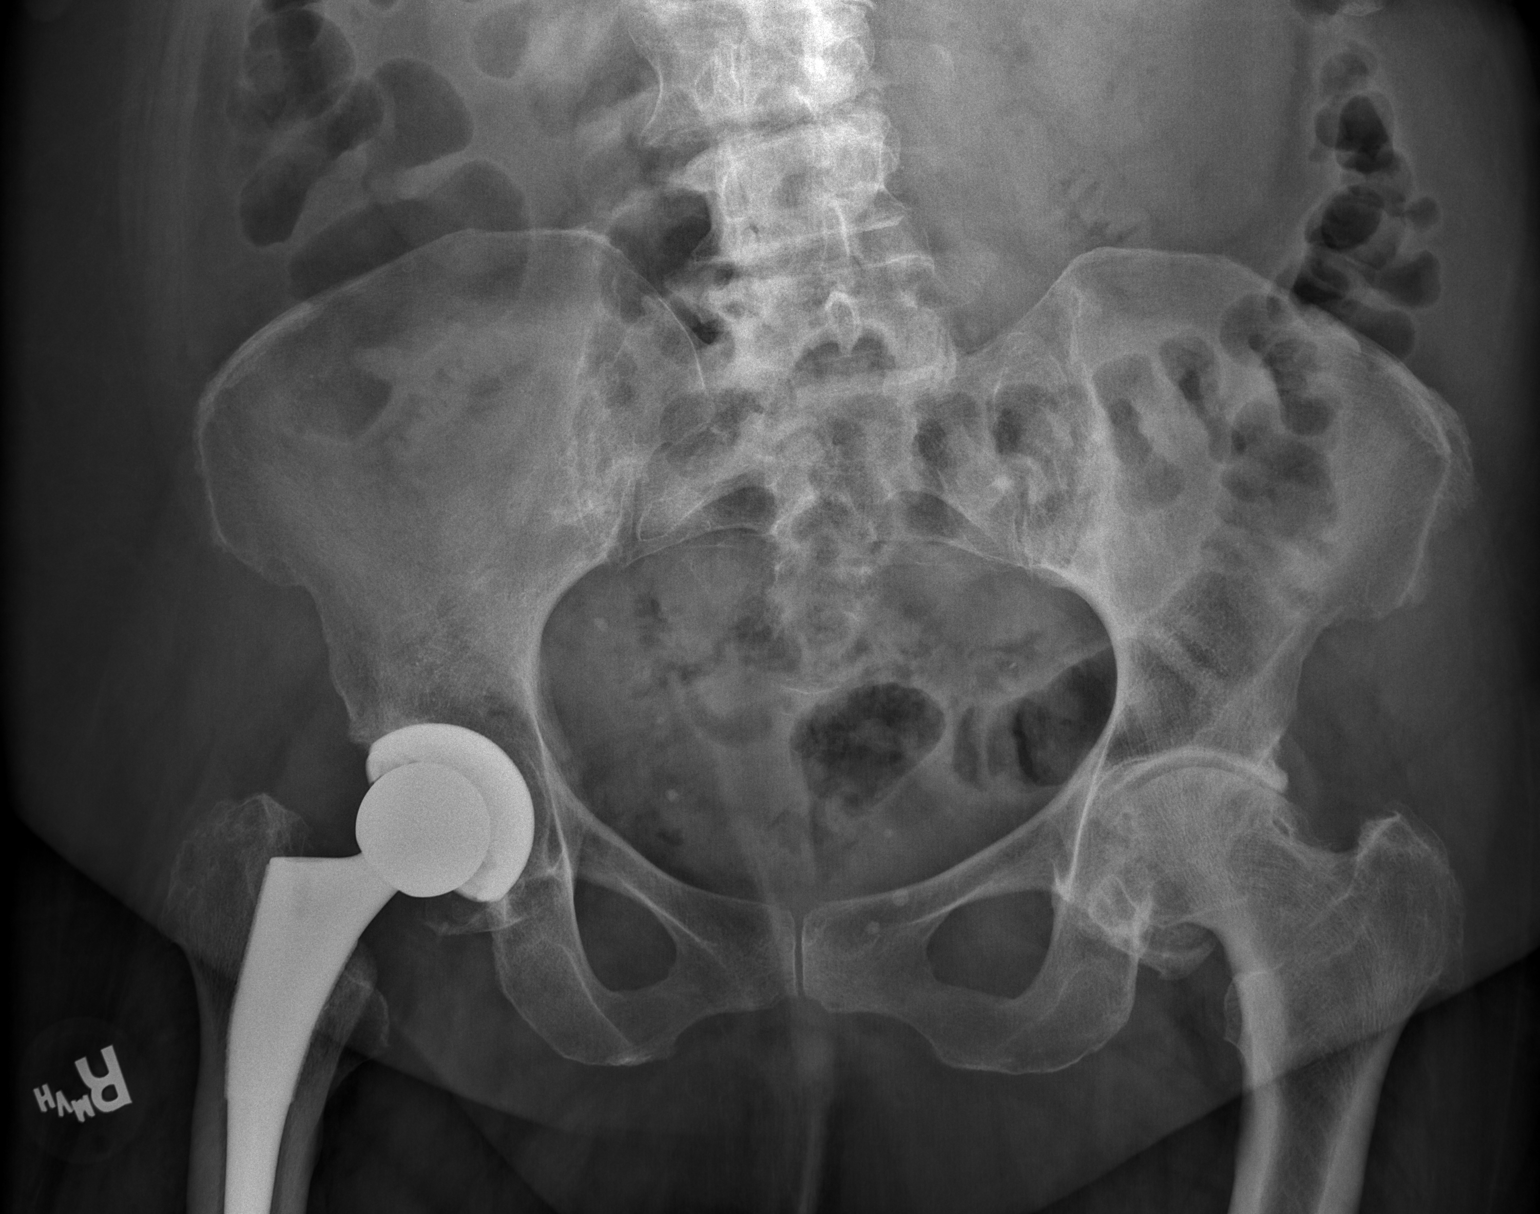

[1 of 1 positions shown; findings below may reference images not displayed]

FINDINGS: Status post right hip replacement with normal alignment. Pubic
symphysis and rami are intact. SI joint degenerative changes.
Advanced arthritis of the left hip with joint space narrowing and
prominent osteophytes.
IMPRESSION: 1. Right hip replacement with normal alignment
2. No acute osseous abnormality
3. Advanced arthritis of the left hip

## 2019-12-15 IMAGING — DX DG CHEST 1V PORT
1 series · 1 of 1 positions shown · non-contrast
Comparison: 06/18/2018

CLINICAL DATA: Weakness

EXAM:
PORTABLE CHEST 1 VIEW

[chest ap]
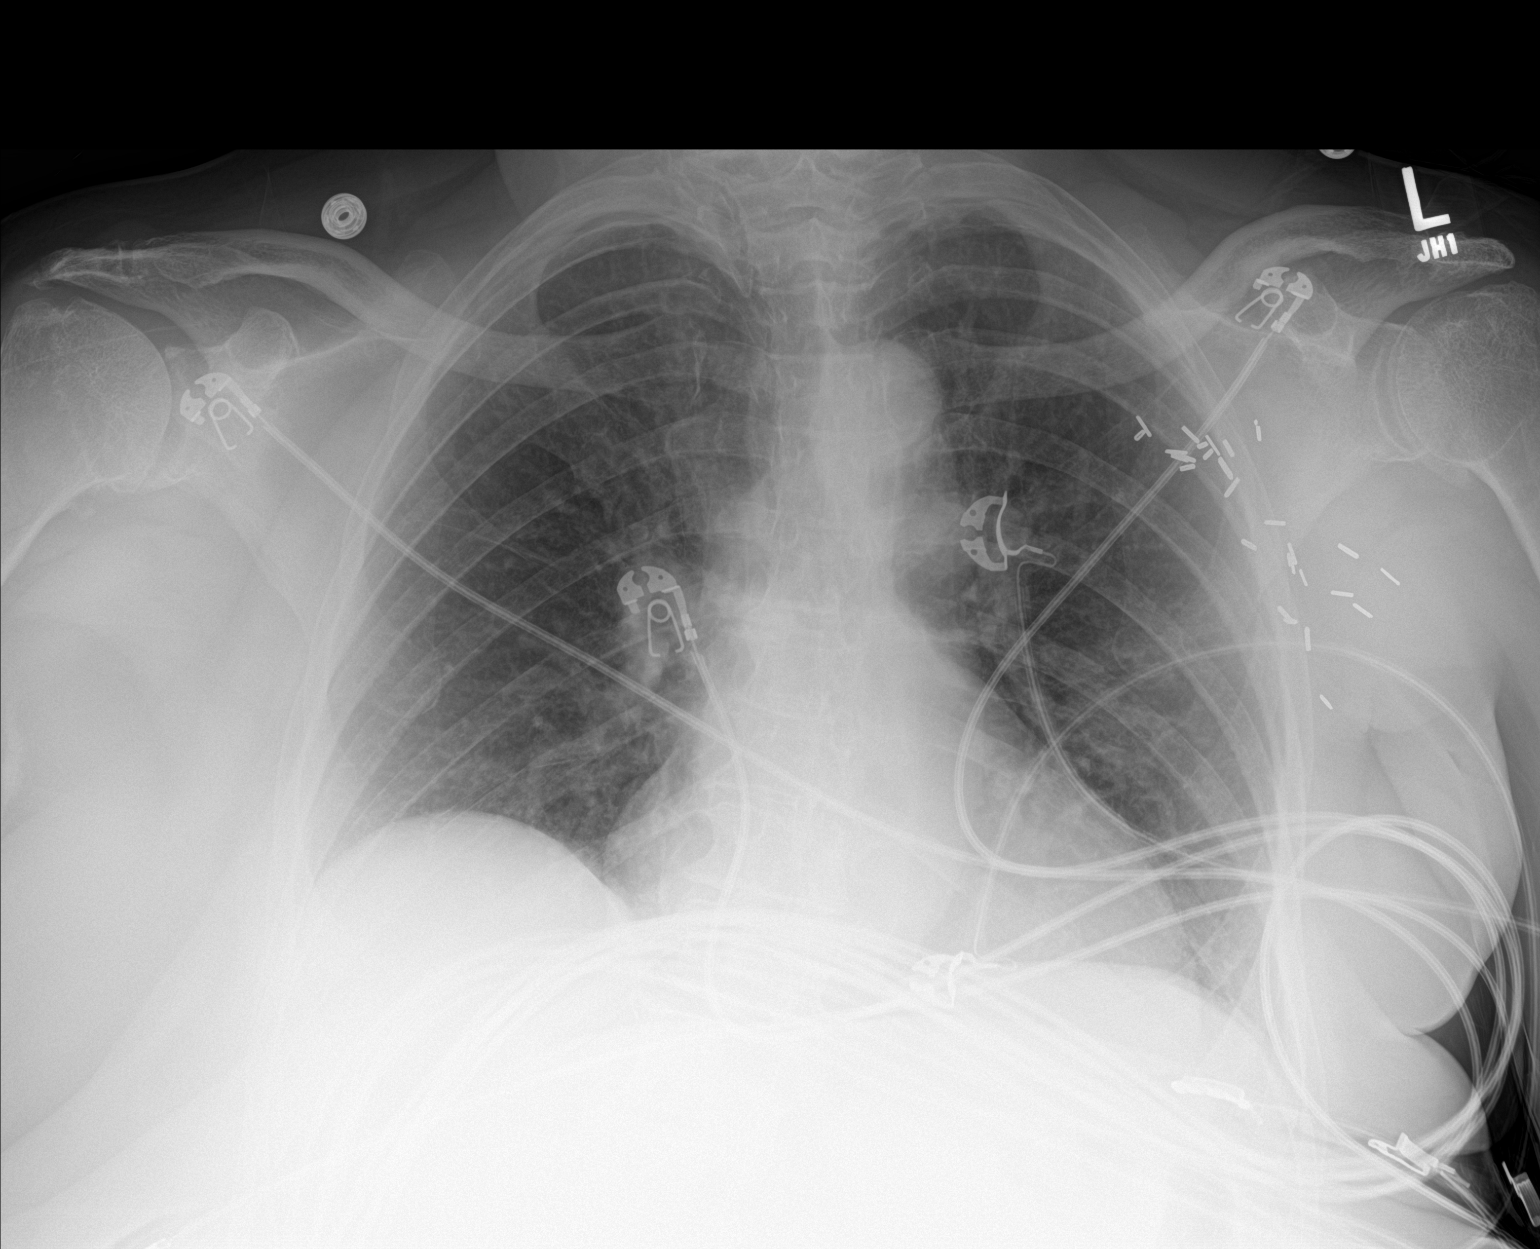

[1 of 1 positions shown; findings below may reference images not displayed]

FINDINGS: Normal heart size when accounting for low volumes. Negative
mediastinal contours. There is no edema, consolidation, effusion, or
pneumothorax. Postoperative left axilla. No acute osseous finding.
IMPRESSION: No evidence of active disease.

## 2019-12-17 ENCOUNTER — Ambulatory Visit
Admission: RE | Admit: 2019-12-17 | Discharge: 2019-12-17 | Disposition: A | Discharge status: Home / Self Care | Payer: Medicare Other | Source: Ambulatory Visit | Attending: Internal Medicine | Admitting: Internal Medicine

## 2019-12-17 ENCOUNTER — Other Ambulatory Visit: Payer: Self-pay

## 2019-12-17 DIAGNOSIS — N63 Unspecified lump in unspecified breast: Secondary | ICD-10-CM

## 2020-01-13 ENCOUNTER — Ambulatory Visit: Payer: Medicare Other | Admitting: Neurology

## 2020-03-15 ENCOUNTER — Other Ambulatory Visit: Payer: Self-pay

## 2020-03-15 ENCOUNTER — Ambulatory Visit (INDEPENDENT_AMBULATORY_CARE_PROVIDER_SITE_OTHER): Payer: Medicare Other | Admitting: Neurology

## 2020-03-15 ENCOUNTER — Encounter: Payer: Self-pay | Admitting: Neurology

## 2020-03-15 VITALS — BP 160/88 | Temp 97.1°F

## 2020-03-15 DIAGNOSIS — R413 Other amnesia: Secondary | ICD-10-CM | POA: Diagnosis not present

## 2020-03-15 DIAGNOSIS — R269 Unspecified abnormalities of gait and mobility: Secondary | ICD-10-CM | POA: Diagnosis not present

## 2020-03-15 NOTE — Progress Notes (Signed)
PATIENT: Ellinore Merced DOB: 09-05-1941  REASON FOR VISIT: follow up HISTORY FROM: patient  HISTORY OF PRESENT ILLNESS: Kennidi Yoshida a 79 years old right-handed female, last clinical visit was with Dr. Jim Like August 2015 previously a patient of Dr. Erling Cruz  She had a history of chronic ocular migraine the past, presented with colorful visual distortion, oftentimes followed by retro-orbital area headaches, when she was younger she has headaches frequently, sometimes the point of passing out, her headache has much improved, especially while take gabapentin 600 mg twice a day, since September 2016, she began to have frequent almost daily pressure behind both eyes, also complains of decreased vision, she has tried over-the-counter ibuprofen Tylenol did not work for her,  She had a history of macular degeneration with decreased vision to begin with, also had bilateral cataract surgery, recent diagnosis of glaucoma by ophthalmologist Dr. Carolynn Sayers She complains of many years history of gradual onset memory trouble, she retired as a Therapist, occupational, she still able to drive, lives in apartment by herself, manage her whole own finances,  Gait difficulty due to bilateral feet pain, scoliosis, chronic low back pain.  UPDATE Nov 01 2015:  ophthalmologist Dr. Katy Fitch evaluation on October 28 2015, patient complains of visual distortions of shapes and sizes, movement of visual imagings, Funduscopy examination showed normal appearance of optic nerve with healthy pink cream and normal appearance nerve fiber layers, macular showed dry age-related macular degeneration, vessels are normal, vitreous was normal, The conclusion was suspected glaucoma, OD was 12, OS was 14, reported that her father had a history of glaucoma She was put on 3 eye drops, she has not started yet, she also had bilateral conjunctiva erythematous change, was diagnosed with recurrent iridocystitis Previous laboratory evaluation  showed elevated C reactive protein of 21, with normal ESR, She wear sunglasses, she complains of light sensitive, she continues both retro-orbital area pressure pain, constant 9/10, x24 hours. Topamax 117m bid did not help her headaches  UPDATE Nov 18 2015: Her headache and conjunctiva erythematous has much improved, everytime she took Fioricet, she had GI side effect, feel sick, band like sensation. She was started on prednisone since December twelfth 2016, started from 20 mg daily, now 10 milligrams daily, she can tolerate the medication well, which has helped her headaches, she is also on higher dose of Topamax 100 mg twice a day, Gabapentin up to 600 mg twice a day did not help her headaches, or her bilateral lower extremity paresthesia pain, will change to Lyrica today, Previous laboratory showed elevated C reactive protein up to 20.9, her headache has much improved with prednisone, likely a component of inflammatory process  UPDATE Dec 28 2015: Her headache, and eye pain has much improved, with no recurrent headache after tapering off prednisone, she is now taking Topamax 100 mg twice a day, she is complaining of increased memory trouble, difficulty focusing, She lives by herself since M2011/04/05when her husband passed away, she graduated from high school, used to work as a sMuseum/gallery curator retired around age 79 she has strong family history of Alzheimer's disease, her maternal grandmother, mother, maternal aunt, sister suffered Alzheimer's disease  UPDATE May 8th 2017: reviewed laboratory evaluation, she has elevated ESR of 50, previous elevated C reactive protein up to 21 in Nov 2016, frequent headaches, responded very well to prednison, she is off steroid now, has no recurrent headache,   I have talked about potential temporal artery biopsy to rule out temporal arteritis, she adamantly  refused, she suffered severe keloid, with associated neuropathic pain, She is taking Topamax 50 mg  twice a day, overall tolerating it well, she no longer has frequent migraine headaches, She complains of diffuse body achy pain multiple joints pain, neuropathic pain around her keloid, worsening scoliosis, and worsening gait difficulty We have personally reviewed MRI of the brain without contrast in April 2017, there was no significant abnormality  UPDATE Nov 8th 2017: She is accompanied by her daughter at today's clinical visit, she reported recent onset of left side headache, left eye achy pain, overall has improved, today's Mini-Mental status examination 29/30  We have personally reviewed MRI brain in February 2017, generalized atrophy mostly at the left parasylvian fissure, slight supratentorium small vessel disease. She is able to tolerate Namenda 10 mg twice a day, Cymbalta 60 mg well, she lives by herself, just sold her car, is in the process of get scat service, continue have diffuse body joints pain, gait abnormality   UPDATE April 26th 2021: She is accompanied by her daughter at today's clinical visit, has been followed up over the past few years by nurse practitioner, she had steady decline over the past few years, now complains of lower extremity swelling, bilateral foot pain, gait abnormality, spends most of the time sitting down, ambulate with walker short distances at home, in addition, she complains of gradual onset memory loss,  I personally reviewed MRI of the brain February 2020, mild generalized atrophy, consistent with her age, no acute abnormalities.  Laboratory evaluation January 2020, normal CMP, negative troponin, CBC, TSH in 2019 was mildly elevated 5.0, she is now on thyroid supplement, Synthroid 50 mcg daily previously B12 in 2017 was 980,  Echocardiogram in February 2020, ejection fraction 55 to 60%, no significant abnormality Ultrasound of carotid artery showed less than 39% stenosis bilaterally, bilateral vertebral artery were anterograde flow.  Venous Doppler  study of bilateral lower extremity in January 2021, no evidence of DVT  Arterial Doppler study is limited by lower extremity swelling, no significant evidence of bilateral lower extremity peripheral vascular disease   REVIEW OF SYSTEMS: Out of a complete 14 system review of symptoms, the patient complains only of the following symptoms, and all other reviewed systems are negative.  Memory loss, falls  ALLERGIES: Allergies  Allergen Reactions  . Adhesive [Tape] Dermatitis    Severe irritation of skin  . Plavix [Clopidogrel Bisulfate] Itching and Other (See Comments)    Causes migraines. Itching all over the body.  . Warfarin And Related Other (See Comments)    Blood thinners - cause migraines in patient.    HOME MEDICATIONS: Outpatient Medications Prior to Visit  Medication Sig Dispense Refill  . aspirin EC 81 MG tablet Take 1 tablet (81 mg total) by mouth 2 (two) times daily. 60 tablet 0  . Calcium Carbonate-Vit D-Min (CALCIUM 1200 PO) Take 1 tablet by mouth 2 (two) times daily.    . Cholecalciferol (VITAMIN D-3) 1000 units CAPS Take 1 tablet by mouth daily.     Marland Kitchen diltiazem (CARDIZEM CD) 120 MG 24 hr capsule Take 120 mg by mouth daily.     Marland Kitchen docusate sodium (COLACE) 100 MG capsule Take 100 mg by mouth 2 (two) times daily.    Marland Kitchen donepezil (ARICEPT) 10 MG tablet Take 1 tablet (10 mg total) by mouth every morning. 90 tablet 3  . furosemide (LASIX) 40 MG tablet Take 120 mg by mouth daily.     Marland Kitchen gabapentin (NEURONTIN) 300 MG capsule Take 1,200  mg by mouth 3 (three) times daily.     . hypromellose (SYSTANE OVERNIGHT THERAPY) 0.3 % GEL ophthalmic ointment Place 1 application into both eyes at bedtime.    Marland Kitchen levothyroxine (SYNTHROID, LEVOTHROID) 50 MCG tablet Take 50 mcg by mouth daily before breakfast.    . losartan (COZAAR) 100 MG tablet Take 100 mg by mouth daily.     . memantine (NAMENDA) 10 MG tablet Take 1 tablet (10 mg total) by mouth 2 (two) times daily. 180 tablet 3  . Multiple  Vitamins-Minerals (PRESERVISION AREDS PO) Take 1 capsule by mouth 2 (two) times daily.     Marland Kitchen oxyCODONE-acetaminophen (PERCOCET/ROXICET) 5-325 MG tablet Take 1 tablet by mouth every 4 (four) hours as needed for severe pain. 30 tablet 0  . potassium chloride (K-DUR,KLOR-CON) 10 MEQ tablet Take 10 mEq by mouth daily.    . propranolol (INDERAL) 60 MG tablet Take 1 tablet (60 mg total) by mouth 2 (two) times daily. 30 tablet 3  . propranolol (INDERAL) 80 MG tablet Take 80 mg by mouth 2 (two) times daily.    . sodium chloride (MURO 128) 5 % ophthalmic solution Place 1 drop into both eyes 4 (four) times daily.     Marland Kitchen tiZANidine (ZANAFLEX) 2 MG tablet Take 1 tablet (2 mg total) by mouth every 6 (six) hours as needed. 60 tablet 0  . UNABLE TO FIND Take 1 tablet by mouth 2 (two) times daily. Med Name: NerveRenew     No facility-administered medications prior to visit.    PAST MEDICAL HISTORY: Past Medical History:  Diagnosis Date  . Anginal pain [facility])    sees [name]; reportedly negative stress/echo 2012  . Arthritis    "hands, elbows, shoulders, and back" (06/28/2018)  . Breast cancer, left breast ([place])   . Carpal tunnel syndrome    bilaterally  . Chronic lower back pain   . Complication of anesthesia    "difficutly waking up and hypothermia and stopped breathing"  . Confusion   . Dementia ([place])    "stage 4; w/both long term and short term memory loss" (06/28/2018)  . Depression   . Diverticulosis   . Esophageal reflux   . Hypertension    sees [name]  . Hypothyroidism   . IBS (irritable bowel syndrome)   . IBS (irritable bowel syndrome)   . Melanoma ([place])    2nd digit right hand; bottom of left foot" (06/28/2018)  . Migraines    "stopped having them 3-4 years ago" (06/28/2018)  . Myocardial infarction [facility])    "a mini one; early 2000s maybe"  . Osteoporosis   . PONV (postoperative nausea and vomiting)   . Refusal of blood transfusions as patient is Jehovah's Witness   .  Scoliosis     PAST SURGICAL HISTORY: Past Surgical History:  Procedure Laterality Date  . ANKLE SURGERY Left 10/2007   "fell at work; don't know what they did to it"  . BREAST BIOPSY Left "X 2"  . BREAST LUMPECTOMY Left   . BREAST SURGERY Left    "removed scar tissue"  . CARDIAC CATHETERIZATION  X 2  . CARDIOVASCULAR STRESS TEST    . CARPAL TUNNEL RELEASE Right 1995  . CARPAL TUNNEL RELEASE Left   . CATARACT EXTRACTION W/ INTRAOCULAR LENS  IMPLANT, BILATERAL    . CHOLECYSTECTOMY  10/04/2012   Procedure: LAPAROSCOPIC CHOLECYSTECTOMY WITH INTRAOPERATIVE CHOLANGIOGRAM;  Surgeon: Adin Hector, MD;  Location: [place];  Service: General;  Laterality: N/A;  laparosopic cholecystectomy with cholangiogram and repair of umbilical hernia  . COLONOSCOPY  08/30/11  . DILATION AND CURETTAGE OF UTERUS    . DOPPLER ECHOCARDIOGRAPHY     hx   . ERCP  10/05/2012   Procedure: ENDOSCOPIC RETROGRADE CHOLANGIOPANCREATOGRAPHY (ERCP);  Surgeon: Jeryl Columbia, MD;  Location[facility];  Service: Endoscopy;  Laterality: N/A;  . ESOPHAGOGASTRODUODENOSCOPY  08/30/11  . HERNIA REPAIR  10/02/2014   "in my stomach; took it out w/gallbladder"  . JOINT REPLACEMENT    . KELOID EXCISION Bilateral    "earlobes"  . KNEE ARTHROSCOPY Right 06/2007  . MELANOMA EXCISION Bilateral    2nd digit right hand; bottom of left foot" (06/28/2018)  . SHOULDER ARTHROSCOPY Right 06/2011  . SKIN GRAFT Right    "related to melanoma restricting my finger bending"  . TOTAL HIP ARTHROPLASTY Right 06/28/2018  . TOTAL HIP ARTHROPLASTY Right 06/28/2018   Procedure: RIGHT TOTAL HIP ARTHROPLASTY ANTERIOR APPROACH;  Surgeon: Frederik Pear, MD;  Location: [facility];  Service: Orthopedics;  Laterality: Right;  . TOTAL KNEE ARTHROPLASTY Right 08/2008  . TUBAL LIGATION      FAMILY HISTORY: Family History  Problem Relation Age of Onset  . Colon polyps Mother   . Breast cancer Sister   . Dementia Sister   . Sleep apnea Daughter     SOCIAL  HISTORY: Social History   Socioeconomic History  . Marital status: Widowed    Spouse name: Not on file  . Number of children: 2  . Years of education: 48  . Highest education level: Not on file  Occupational History  . Occupation: Press photographer    Comment: Retired  Tobacco Use  . Smoking status: Never Smoker  . Smokeless tobacco: Never Used  Substance and Sexual Activity  . Alcohol use: Not Currently  . Drug use: Never  . Sexual activity: Not Currently  Other Topics Concern  . Not on file  Social History Narrative   Patient lives at home with her father and he is blind. Patient is retired. Patient is widowed and has two children.   Right handed.   Caffeine- None   Social Determinants of Health   Financial Resource Strain:   . Difficulty of Paying Living Expenses:   Food Insecurity:   . Worried About Charity fundraiser in the Last Year:   . Arboriculturist in the Last Year:   Transportation Needs:   . Film/video editor (Medical):   Marland Kitchen Lack of Transportation (Non-Medical):   Physical Activity:   . Days of Exercise per Week:   . Minutes of Exercise per Session:   Stress:   . Feeling of Stress :   Social Connections:   . Frequency of Communication with Friends and Family:   . Frequency of Social Gatherings with Friends and Family:   . Attends Religious Services:   . Active Member of Clubs or Organizations:   . Attends Archivist Meetings:   Marland Kitchen Marital Status:   Intimate Partner Violence:   . Fear of Current or Ex-Partner:   . Emotionally Abused:   Marland Kitchen Physically Abused:   . Sexually Abused:     PHYSICAL EXAM  Vitals:   03/15/20 1431  BP: (!) 160/88  Temp: (!) 97.1 F (36.2 C)   There is no height or weight on file to calculate BMI.  Generalized: Well developed, in no acute distress  MMSE - Mini Mental State Exam 03/15/2020 07/02/2019 08/05/2018  Not completed: - (No  Data) -  Orientation to time _0 Orientation to Place _1 Registration _2 Attention/ Calculation _3 Recall _4 Language- name 2 objects _5 Language- repeat _6 Language- follow 3 step command _7 Language- read & follow direction _8 Write a sentence _9 Copy design 1 0 1  Total score _10 Animal naming 13  Neurological examination    PHYSICAL EXAMNIATION:  Gen: NAD, conversant, well nourised, well groomed                     Cardiovascular: Regular rate rhythm, no peripheral edema, warm, nontender. Eyes: Conjunctivae clear without exudates or hemorrhage Neck: Supple, no carotid bruits. Pulmonary: Clear to auscultation bilaterally   NEUROLOGICAL EXAM:  MENTAL STATUS: Speech/Cognition: Awake, alert, normal speech, oriented to history taking and casual conversation.  CRANIAL NERVES: CN II: Visual fields are full to confrontation.  Pupils are round equal and briskly reactive to light. CN III, IV, VI: extraocular movement are normal. No ptosis. CN V: Facial sensation is intact to light touch. CN VII: Face is symmetric with normal eye closure and smile. CN VIII: Hearing is normal to casual conversation CN IX, X: Palate elevates symmetrically. Phonation is normal. CN XI: Head turning and shoulder shrug are intact CN XII: Tongue is midline with normal movements and no atrophy.  MOTOR: She has significant bilateral lower extremity edema, skin discoloration, only mild pitting edema, likely significant increase edema component, muscle strength is normal, distal leg weakness (limited because of swelling, pain,   REFLEXES: Reflexes are hypoactive and symmetric at the biceps, triceps, absent at knees and ankles. Plantar responses are flexor.  SENSORY: Length dependent decreased light touch, pinprick, vibratory sensation to knee level  COORDINATION: There is no trunk or limb ataxia.    GAIT/STANCE: Rely on her walker to get up from seated position, wide-based, cautious, unsteady  DIAGNOSTIC DATA (LABS, IMAGING,  TESTING) - I reviewed patient records, labs, notes, testing and imaging myself where available.  Lab Results  Component Value Date   WBC 7.2 11/21/2018   HGB 13.9 11/21/2018   HCT 41.0 11/21/2018   MCV 94.0 11/21/2018   PLT 166 11/21/2018      Component Value Date/Time   NA 143 11/21/2018 1841   NA 141 05/01/2018 1526   K 3.8 11/21/2018 1841   CL 108 11/21/2018 1841   CO2 25 11/21/2018 1826   GLUCOSE 96 11/21/2018 1841   BUN 8 11/21/2018 1841   BUN 15 05/01/2018 1526   CREATININE 0.70 11/21/2018 1841   CALCIUM 9.5 11/21/2018 1826   PROT 7.4 11/21/2018 1826   PROT 7.1 01/11/2016 1417   ALBUMIN 3.6 11/21/2018 1826   ALBUMIN 3.9 01/11/2016 1417   AST 31 11/21/2018 1826   ALT 30 11/21/2018 1826   ALKPHOS 150 (H) 11/21/2018 1826   BILITOT 0.6 11/21/2018 1826   BILITOT 0.3 01/11/2016 1417   GFRNONAA >60 11/21/2018 1826   GFRAA >60 11/21/2018 1826   No results found for: CHOL, HDL, LDLCALC, LDLDIRECT, TRIG, CHOLHDL No results found for: HGBA1C Lab Results  Component Value Date   VITAMINB12 980 (H) 01/11/2016   Lab Results  Component Value Date   TSH 5.070 (H) 05/01/2018    ASSESSMENT AND PLAN 79 y.o. year old female   Memory loss  Mini-Mental Status Examination 59  out of 61, animal naming 13,  I personally reviewed MRI of the brain February 2020, age appropriate mild generalized atrophy, only mild small vessel disease  Previous laboratory evaluation showed no treatable etiology, including normal TSH, B12  Her complaints of memory loss is likely due to her stress, housing condition (she lives in a small apartment with her daughter and her family, do not have room to move about), normal aging process, medication side effect, there was no significant evidence of central nervous system degenerative disorder, or vascular component.  Gait abnormality  Most related to her bilateral lower extremity edema, more than just pitting edema, likely lymphocytic edema, with  significantly enlarged bilateral feet, could no longer fitting shoes, deconditioning, obesity,  She lives in a small apartment do not have room to move about, does not want to pursue home physical therapy or outpatient physical therapy at this point,  Suggest her keep moderate exercise  Return to clinic for new issues.  Marcial Pacas, M.D. Ph.D.  [facility] [place], [place] [address] Phone: [phone] Fax:      [phone]

## 2020-07-14 ENCOUNTER — Ambulatory Visit: Payer: Medicare Other | Admitting: Cardiovascular Disease

## 2020-08-20 ENCOUNTER — Encounter: Payer: Self-pay | Admitting: Cardiovascular Disease

## 2020-08-20 ENCOUNTER — Ambulatory Visit (INDEPENDENT_AMBULATORY_CARE_PROVIDER_SITE_OTHER): Payer: Medicare Other | Admitting: Cardiovascular Disease

## 2020-08-20 ENCOUNTER — Other Ambulatory Visit: Payer: Self-pay

## 2020-08-20 VITALS — BP 140/77 | HR 55 | Ht 64.0 in | Wt 251.0 lb

## 2020-08-20 DIAGNOSIS — I10 Essential (primary) hypertension: Secondary | ICD-10-CM | POA: Diagnosis not present

## 2020-08-20 DIAGNOSIS — L97911 Non-pressure chronic ulcer of unspecified part of right lower leg limited to breakdown of skin: Secondary | ICD-10-CM | POA: Diagnosis not present

## 2020-08-20 NOTE — Patient Instructions (Signed)

## 2020-08-20 NOTE — Progress Notes (Signed)
Cardiology Office Note   Date:  08/21/2020   ID:  Ashley Hanson, DOB 1941-11-04, MRN [id number]  PCP:  Michael Boston, MD  Cardiologist:  Sanda Klein, MD  Electrophysiologist:  None   Evaluation Performed:  Follow-Up Visit  Chief Complaint:  HTN  History of Present Illness:    Ashley Hanson is a 79 y.o. female with a longstanding history of systemic hypertension and more recent development of dementia.  In January 2020, she had an episode of altered consciousness where she was poorly responsive and there was concern for possible stroke.  Her work-up included an echocardiogram and bilateral carotid artery Doppler ultrasonography, both tests with completely normal results.  She also had an abnormal MRI of the brain.  Routine labs were unremarkable.  Other than slow progression of her cognitive deficits there have been no major changes in her day-to-day functional status.  She is very sedentary.  She does complain of fatigue.  She has an ulcer on the dorsum of her right heel that is healing very slowly.  She is wearing compression bandages.  She had bilateral ABIs checked in January, but had noncompressible calf vessels, but the waveform was multiphasic and she had toe waveforms present.  Venous Dopplers did not show DVT.  She denies angina or dyspnea at rest or with activity, orthopnea, PND, lower extremity claudication, palpitations, dizziness or syncope.  She has not had any recent falls.  She has a history of "false positive" nuclear stress test in the past and had normal coronaries by remote angiography in 2005.  Her most recent nuclear stress test in June 2019 was a frankly normal study.   Past Medical History:  Diagnosis Date  . Anginal pain [facility])    sees [name]; reportedly negative stress/echo 2012  . Arthritis    "hands, elbows, shoulders, and back" (06/28/2018)  . Breast cancer, left breast ([place])   . Carpal tunnel syndrome    bilaterally  . Chronic lower back pain     . Complication of anesthesia    "difficutly waking up and hypothermia and stopped breathing"  . Confusion   . Dementia ([place])    "stage 4; w/both long term and short term memory loss" (06/28/2018)  . Depression   . Diverticulosis   . Esophageal reflux   . Hypertension    sees [name]  . Hypothyroidism   . IBS (irritable bowel syndrome)   . IBS (irritable bowel syndrome)   . Melanoma ([place])    2nd digit right hand; bottom of left foot" (06/28/2018)  . Migraines    "stopped having them 3-4 years ago" (06/28/2018)  . Myocardial infarction [facility])    "a mini one; early 2000s maybe"  . Osteoporosis   . PONV (postoperative nausea and vomiting)   . Refusal of blood transfusions as patient is Jehovah's Witness   . Scoliosis    Past Surgical History:  Procedure Laterality Date  . ANKLE SURGERY Left 10/2007   "fell at work; don't know what they did to it"  . BREAST BIOPSY Left "X 2"  . BREAST LUMPECTOMY Left   . BREAST SURGERY Left    "removed scar tissue"  . CARDIAC CATHETERIZATION  X 2  . CARDIOVASCULAR STRESS TEST    . CARPAL TUNNEL RELEASE Right 1995  . CARPAL TUNNEL RELEASE Left   . CATARACT EXTRACTION W/ INTRAOCULAR LENS  IMPLANT, BILATERAL    . CHOLECYSTECTOMY  10/04/2012   Procedure: LAPAROSCOPIC CHOLECYSTECTOMY WITH INTRAOPERATIVE CHOLANGIOGRAM;  Surgeon:  Adin Hector, MD;  Location: [facility];  Service: General;  Laterality: N/A;  laparosopic cholecystectomy with cholangiogram and repair of umbilical hernia  . COLONOSCOPY  08/30/11  . DILATION AND CURETTAGE OF UTERUS    . DOPPLER ECHOCARDIOGRAPHY     hx   . ERCP  10/05/2012   Procedure: ENDOSCOPIC RETROGRADE CHOLANGIOPANCREATOGRAPHY (ERCP);  Surgeon: Jeryl Columbia, MD;  Location: [facility] ENDOSCOPY;  Service: Endoscopy;  Laterality: N/A;  . ESOPHAGOGASTRODUODENOSCOPY  08/30/11  . HERNIA REPAIR  10/02/2014   "in my stomach; took it out w/gallbladder"  . JOINT REPLACEMENT    . KELOID EXCISION Bilateral    "earlobes"  .  KNEE ARTHROSCOPY Right 06/2007  . MELANOMA EXCISION Bilateral    2nd digit right hand; bottom of left foot" (06/28/2018)  . SHOULDER ARTHROSCOPY Right 06/2011  . SKIN GRAFT Right    "related to melanoma restricting my finger bending"  . TOTAL HIP ARTHROPLASTY Right 06/28/2018  . TOTAL HIP ARTHROPLASTY Right 06/28/2018   Procedure: RIGHT TOTAL HIP ARTHROPLASTY ANTERIOR APPROACH;  Surgeon: Frederik Pear, MD;  Location: [facility];  Service: Orthopedics;  Laterality: Right;  . TOTAL KNEE ARTHROPLASTY Right 08/2008  . TUBAL LIGATION       Current Meds  Medication Sig  . aspirin EC 81 MG tablet Take 1 tablet (81 mg total) by mouth 2 (two) times daily.  . Calcium Carbonate-Vit D-Min (CALCIUM 1200 PO) Take 1 tablet by mouth 2 (two) times daily.  . Cholecalciferol (VITAMIN D-3) 1000 units CAPS Take 1 tablet by mouth daily.   Marland Kitchen docusate sodium (COLACE) 100 MG capsule Take 100 mg by mouth 2 (two) times daily.  Marland Kitchen donepezil (ARICEPT) 10 MG tablet Take 1 tablet (10 mg total) by mouth every morning.  . furosemide (LASIX) 40 MG tablet Take 120 mg by mouth daily.   Marland Kitchen gabapentin (NEURONTIN) 300 MG capsule Take 1,200 mg by mouth 3 (three) times daily.   . hypromellose (SYSTANE OVERNIGHT THERAPY) 0.3 % GEL ophthalmic ointment Place 1 application into both eyes at bedtime.  Marland Kitchen levothyroxine (SYNTHROID, LEVOTHROID) 50 MCG tablet Take 50 mcg by mouth daily before breakfast.  . losartan (COZAAR) 100 MG tablet Take 100 mg by mouth daily.   . memantine (NAMENDA) 10 MG tablet Take 1 tablet (10 mg total) by mouth 2 (two) times daily.  . Multiple Vitamins-Minerals (PRESERVISION AREDS PO) Take 1 capsule by mouth 2 (two) times daily.   Marland Kitchen oxyCODONE-acetaminophen (PERCOCET/ROXICET) 5-325 MG tablet Take 1 tablet by mouth every 4 (four) hours as needed for severe pain.  . potassium chloride (K-DUR,KLOR-CON) 10 MEQ tablet Take 10 mEq by mouth daily.  . propranolol (INDERAL) 80 MG tablet Take 80 mg by mouth 2 (two) times daily.  .  sodium chloride (MURO 128) 5 % ophthalmic solution Place 1 drop into both eyes 4 (four) times daily.   Marland Kitchen tiZANidine (ZANAFLEX) 2 MG tablet Take 1 tablet (2 mg total) by mouth every 6 (six) hours as needed.  Marland Kitchen UNABLE TO FIND Take 1 tablet by mouth 2 (two) times daily. Med Name: NerveRenew  . [DISCONTINUED] diltiazem (CARDIZEM CD) 120 MG 24 hr capsule Take 120 mg by mouth daily.   . [DISCONTINUED] propranolol (INDERAL) 60 MG tablet Take 1 tablet (60 mg total) by mouth 2 (two) times daily.     Allergies:   Adhesive [tape], Plavix [clopidogrel bisulfate], and Warfarin and related   Social History   Tobacco Use  . Smoking status: Never Smoker  . Smokeless tobacco:  Never Used  Vaping Use  . Vaping Use: Never used  Substance Use Topics  . Alcohol use: Not Currently  . Drug use: Never     Family Hx: The patient's family history includes Breast cancer in her sister; Colon polyps in her mother; Dementia in her sister; Sleep apnea in her daughter.  ROS:   Please see the history of present illness.   All other systems are reviewed and are negative.   Prior CV studies:   The following studies were reviewed today: Echocardiogram and carotid Dopplers 12/26/2018 Lower extremity arterial and venous Dopplers January 2021  Labs/Other Tests and Data Reviewed:    EKG:  Ordered today, shows sinus bradycardia with a single PAC and voltage criteria for LVH, otherwise normal LVH  Recent Labs: No results found for requested labs within last 8760 hours.   Recent Lipid Panel No results found for: CHOL, TRIG, HDL, CHOLHDL, LDLCALC, LDLDIRECT  Wt Readings from Last 3 Encounters:  08/20/20 251 lb (113.9 kg)  07/02/19 248 lb 9.6 oz (112.8 kg)  06/05/19 236 lb (107 kg)     Objective:    Vital Signs:  BP 140/77   Pulse (!) 55   Ht 5\' 4"  (1.626 m)   Wt 251 lb (113.9 kg)   SpO2 95%   BMI 43.08 kg/m     General: Alert, oriented x3, no distress, morbidly obese Head: no evidence of trauma,  PERRL, EOMI, no exophtalmos or lid lag, no myxedema, no xanthelasma; normal ears, nose and oropharynx Neck: normal jugular venous pulsations and no hepatojugular reflux; brisk carotid pulses without delay and no carotid bruits Chest: clear to auscultation, no signs of consolidation by percussion or palpation, normal fremitus, symmetrical and full respiratory excursions Cardiovascular: normal position and quality of the apical impulse, regular rhythm with rare ectopy, normal first and second heart sounds, no murmurs, rubs or gallops Abdomen: no tenderness or distention, no masses by palpation, no abnormal pulsatility or arterial bruits, normal bowel sounds, no hepatosplenomegaly Extremities: Wearing compressive bandages on both legs, no clubbing, cyanosis; 2+ radial, ulnar and brachial pulses bilaterally; 2+ right femoral, posterior tibial and dorsalis pedis pulses; 2+ left femoral, posterior tibial and dorsalis pedis pulses; no subclavian or femoral bruits Neurological: grossly nonfocal Psych: Normal mood and affect   ASSESSMENT & PLAN:    1. HTN: In acceptable range considering her history of multiple falls 2. History of false-positive nuclear stress test with normal coronary angiography.  Normal left ventricular systolic and diastolic function. 3. Venous ulcer of right lower leg:  keep the leg elevated as much as possible.  Continue wound care.     COVID-19 Education: The signs and symptoms of COVID-19 were discussed with the patient and how to seek care for testing (follow up with PCP or arrange E-visit).  The importance of social distancing was discussed today.  Time:   Today, I have spent 13 minutes with the patient with telehealth technology discussing the above problems.     Medication Adjustments/Labs and Tests Ordered: Current medicines are reviewed at length with the patient today.  Concerns regarding medicines are outlined above.   Tests Ordered: Orders Placed This Encounter   Procedures  . EKG 12-Lead    Medication Changes: No orders of the defined types were placed in this encounter.   Follow Up:  Virtual Visit or In Person 1 year  Signed, Sanda Klein, MD  08/21/2020 3:52 PM    [place]

## 2020-08-21 ENCOUNTER — Encounter: Payer: Self-pay | Admitting: Cardiovascular Disease

## 2020-08-23 ENCOUNTER — Telehealth: Payer: Self-pay | Admitting: Licensed Clinical Social Worker

## 2020-08-23 NOTE — Telephone Encounter (Signed)
CSW referred to assist patient with obtaining a BP cuff. CSW contacted patient to inform cuff will be delivered to home. Patient grateful for support and assistance. CSW available as needed. Jackie Eytan Carrigan, LCSW, CCSW-MCS [phone]  

## 2020-10-13 ENCOUNTER — Other Ambulatory Visit: Payer: Self-pay | Admitting: Internal Medicine

## 2020-12-17 ENCOUNTER — Telehealth: Payer: Self-pay | Admitting: Cardiovascular Disease

## 2020-12-17 ENCOUNTER — Other Ambulatory Visit: Payer: Self-pay | Admitting: Orthopedic Surgery

## 2020-12-17 DIAGNOSIS — Z01818 Encounter for other preprocedural examination: Secondary | ICD-10-CM

## 2020-12-17 NOTE — Telephone Encounter (Signed)
° °  [facility] Pre-operative Risk Assessment    HEARTCARE STAFF: - Please ensure there is not already an duplicate clearance open for this procedure. - Under Visit Info/Reason for Call, type in Other and utilize the format Clearance MM/DD/YY or Clearance TBD. Do not use dashes or single digits. - If request is for dental extraction, please clarify the # of teeth to be extracted.  Request for surgical clearance:  1. What type of surgery is being performed? LEFT TOTAL HIP ARTHROPLASTY ANTERIOR APPROACH  2. When is this surgery scheduled?  01/17/21  3. What type of clearance is required (medical clearance vs. Pharmacy clearance to hold med vs. Both)? Both  4. Are there any medications that need to be held prior to surgery and how long? Aspirin  / Blood thinners   5. Practice name and name of physician performing surgery? [facility]   6. What is the office phone number? [phone]   7.   What is the office fax number? [phone]  8.   Anesthesia type (None, local, MAC, general) ? Spinal    Shana A Stovall 12/17/2020, 12:05 PM  _________________________________________________________________   (provider comments below)

## 2020-12-17 NOTE — Telephone Encounter (Signed)
Dr. Sallyanne Kuster to review.  I spoke with [name].  She is a 80 year old female with past medical history of hypertension and dementia.  She had a false positive stress test in the past however normal coronary arteries on cardiac catheterization in 2005.  Most recent Myoview was in June 2019 that was normal.  Talking with the patient, she is unable to ambulate for long distance due to severe scoliosis and hip issue.  She has upcoming hip surgery by Dr. Mayer Camel.  Otherwise she denies any chest pain or shortness of breath.  She was recently seen by her PCP last week and reported stable lab work.  Since she is clearly unable to accomplish more than 4 METS of activity, will forward to Dr. Sallyanne Kuster to review to see if any additional work-up is needed prior to the surgery.  Please forward your response to P CV DIV PREOP

## 2020-12-17 NOTE — Telephone Encounter (Signed)
Low risk nuclear stress test 2019. I do not think any additional CV workup is needed before the planned orthopedic procedure.

## 2020-12-20 ENCOUNTER — Other Ambulatory Visit: Payer: Self-pay | Admitting: Orthopedic Surgery

## 2020-12-20 NOTE — Telephone Encounter (Signed)
   Primary Cardiologist: Sanda Klein, MD  Chart reviewed as part of pre-operative protocol coverage. Given past medical history and time since last visit, based on ACC/AHA guidelines, Socorro Kanitz would be at acceptable risk for the planned procedure without further cardiovascular testing.   OK to hold aspirin 3-5 days pre op if needed.  The patient was advised that if she develops new symptoms prior to surgery to contact our office to arrange for a follow-up visit, and she verbalized understanding.  I will route this recommendation to the requesting party via Epic fax function and remove from pre-op pool.  Please call with questions.  Kerin Ransom, PA-C 12/20/2020, 9:15 AM

## 2021-01-09 NOTE — Progress Notes (Signed)
Cardiology Office Note:    Date:  01/10/2021   ID:  Ashley Hanson, DOB [birth year], MRN [id number]  PCP:  Michael Boston, MD  Cardiologist:  Sanda Klein, MD   Referring MD: Michael Boston, MD   Chief Complaint  Patient presents with  . Pre-op Exam    History of Present Illness:    Ashley Hanson is a 80 y.o. female with a hx of hypertension, TIA, and dementia. She has a history of a "false positive" nuclear stress test followed by heart cath with normal coronaries in 2005. She had a nonischemic nuclear stress test in 2019. She had an episode of altered consciousness in Feb 2020 and underwent workup for stroke. Echo at that time with normal EF, normal DD, moderately dilated left atrium, and no significant valvular disease.  Carotid artery dopplers with mild nonobstructive disease bilaterally. MRI brain was unremarkable with no acute findings. She is in PT for lymphedema. She was last seen by Dr. Sallyanne Kuster 06/05/19 and was doing OK. She takes donepezil and memantine for dementia. She falls frequently and states she has learned to fall without injury She does have neuropathy which has improved with toe caps. She is maintained on 20 mg crestor, 80 mg propranolol BID, 12.5 mg HCTZ, and 81 mg ASA.   She unfortunately needs left hip replacement. We have been asked for cardiac clearance. Dr. Sallyanne Kuster has reviewed the chart since she is unable to complete 4.0 METS and has recommended no additional cardiac testing prior to surgery. She is cleared as acceptable risk.   She presents today for follow up. Mobility is limited by scoliosis, arthritis, chronic pain, and hip and back pain. She walks with a walker. She denies chest pain. She does chair exercises. She is quite sedentary. She has done well with prior surgeries on her hip, knee and shoulder. She has not fallen in the last 2 years. She now walks with a walker, before she was using two canes and fell frequently. She has had a remarkable recovery in  lymphedema.    Past Medical History:  Diagnosis Date  . Anginal pain [facility])    sees [name]; reportedly negative stress/echo 2012  . Arthritis    "hands, elbows, shoulders, and back" (06/28/2018)  . Breast cancer, left breast ([place])   . Carpal tunnel syndrome    bilaterally  . Chronic lower back pain   . Complication of anesthesia    "difficutly waking up and hypothermia and stopped breathing"  . Confusion   . Dementia ([place])    "stage 4; w/both long term and short term memory loss" (06/28/2018)  . Depression   . Diverticulosis   . Esophageal reflux   . Hypertension    sees [name]  . Hypothyroidism   . IBS (irritable bowel syndrome)   . IBS (irritable bowel syndrome)   . Melanoma ([place])    2nd digit right hand; bottom of left foot" (06/28/2018)  . Migraines    "stopped having them 3-4 years ago" (06/28/2018)  . Myocardial infarction [facility])    "a mini one; early 2000s maybe"  . Osteoporosis   . PONV (postoperative nausea and vomiting)   . Refusal of blood transfusions as patient is Jehovah's Witness   . Scoliosis     Past Surgical History:  Procedure Laterality Date  . ANKLE SURGERY Left 10/2007   "fell at work; don't know what they did to it"  . BREAST BIOPSY Left "X 2"  . BREAST LUMPECTOMY Left   .  BREAST SURGERY Left    "removed scar tissue"  . CARDIAC CATHETERIZATION  X 2  . CARDIOVASCULAR STRESS TEST    . CARPAL TUNNEL RELEASE Right 1995  . CARPAL TUNNEL RELEASE Left   . CATARACT EXTRACTION W/ INTRAOCULAR LENS  IMPLANT, BILATERAL    . CHOLECYSTECTOMY  10/04/2012   Procedure: LAPAROSCOPIC CHOLECYSTECTOMY WITH INTRAOPERATIVE CHOLANGIOGRAM;  Surgeon: Adin Hector, MD;  Location: [place];  Service: General;  Laterality: N/A;  laparosopic cholecystectomy with cholangiogram and repair of umbilical hernia  . COLONOSCOPY  08/30/11  . DILATION AND CURETTAGE OF UTERUS    . DOPPLER ECHOCARDIOGRAPHY     hx   . ERCP  10/05/2012   Procedure: ENDOSCOPIC RETROGRADE  CHOLANGIOPANCREATOGRAPHY (ERCP);  Surgeon: Jeryl Columbia, MD;  Location[facility] ENDOSCOPY;  Service: Endoscopy;  Laterality: N/A;  . ESOPHAGOGASTRODUODENOSCOPY  08/30/11  . HERNIA REPAIR  10/02/2014   "in my stomach; took it out w/gallbladder"  . JOINT REPLACEMENT    . KELOID EXCISION Bilateral    "earlobes"  . KNEE ARTHROSCOPY Right 06/2007  . MELANOMA EXCISION Bilateral    2nd digit right hand; bottom of left foot" (06/28/2018)  . SHOULDER ARTHROSCOPY Right 06/2011  . SKIN GRAFT Right    "related to melanoma restricting my finger bending"  . TOTAL HIP ARTHROPLASTY Right 06/28/2018  . TOTAL HIP ARTHROPLASTY Right 06/28/2018   Procedure: RIGHT TOTAL HIP ARTHROPLASTY ANTERIOR APPROACH;  Surgeon: Frederik Pear, MD;  Location: [place];  Service: Orthopedics;  Laterality: Right;  . TOTAL KNEE ARTHROPLASTY Right 08/2008  . TUBAL LIGATION      Current Medications: Current Meds  Medication Sig  . aspirin EC 81 MG tablet Take 1 tablet (81 mg total) by mouth 2 (two) times daily.  . Calcium Carbonate-Vit D-Min (CALCIUM 1200 PO) Take 1 tablet by mouth 2 (two) times daily.  . Cholecalciferol (VITAMIN D-3) 1000 units CAPS Take 1,000 Units by mouth daily.  Marland Kitchen docusate sodium (COLACE) 100 MG capsule Take 100 mg by mouth 2 (two) times daily.  Marland Kitchen donepezil (ARICEPT) 10 MG tablet Take 1 tablet (10 mg total) by mouth every morning.  . gabapentin (NEURONTIN) 600 MG tablet Take 600 mg by mouth 3 (three) times daily.  . hydrochlorothiazide (HYDRODIURIL) 12.5 MG tablet Take 12.5 mg by mouth daily.  . hypromellose (GENTEAL) 0.3 % GEL ophthalmic ointment Place 1 application into both eyes at bedtime.  Marland Kitchen levothyroxine (SYNTHROID, LEVOTHROID) 50 MCG tablet Take 50 mcg by mouth daily before breakfast.  . losartan (COZAAR) 100 MG tablet Take 100 mg by mouth daily.   . memantine (NAMENDA) 10 MG tablet Take 1 tablet (10 mg total) by mouth 2 (two) times daily.  . Multiple Vitamins-Minerals (PRESERVISION AREDS PO) Take 1 capsule  by mouth 2 (two) times daily.   . propranolol (INDERAL) 80 MG tablet Take 80 mg by mouth 2 (two) times daily.  . rosuvastatin (CRESTOR) 20 MG tablet Take 20 mg by mouth at bedtime.  . sodium chloride (MURO 128) 5 % ophthalmic solution Place 1 drop into both eyes 4 (four) times daily.   Marland Kitchen tiZANidine (ZANAFLEX) 2 MG tablet Take 1 tablet (2 mg total) by mouth every 6 (six) hours as needed. (Patient taking differently: Take 2 mg by mouth every 6 (six) hours as needed for muscle spasms.)  . traMADol (ULTRAM) 50 MG tablet Take 50 mg by mouth 2 (two) times daily as needed for severe pain.  Marland Kitchen UNABLE TO FIND Take 1 tablet by mouth 2 (two)  times daily. Med Name: NerveRenew     Allergies:   Adhesive [tape], Plavix [clopidogrel bisulfate], and Warfarin and related   Social History   Socioeconomic History  . Marital status: Widowed    Spouse name: Not on file  . Number of children: 2  . Years of education: 46  . Highest education level: Not on file  Occupational History  . Occupation: Press photographer    Comment: Retired  Tobacco Use  . Smoking status: Never Smoker  . Smokeless tobacco: Never Used  Vaping Use  . Vaping Use: Never used  Substance and Sexual Activity  . Alcohol use: Not Currently  . Drug use: Never  . Sexual activity: Not Currently  Other Topics Concern  . Not on file  Social History Narrative   Patient lives at home with her father and he is blind. Patient is retired. Patient is widowed and has two children.   Right handed.   Caffeine- None   Social Determinants of Health   Financial Resource Strain: Not on file  Food Insecurity: Not on file  Transportation Needs: Not on file  Physical Activity: Not on file  Stress: Not on file  Social Connections: Not on file     Family History: The patient's family history includes Breast cancer in her sister; Colon polyps in her mother; Dementia in her sister; Sleep apnea in her daughter.  ROS:   Please see the history of present  illness.     All other systems reviewed and are negative.  EKGs/Labs/Other Studies Reviewed:    The following studies were reviewed today:  Echo 2020: 1. The left ventricle has normal systolic function of 23-53%. The cavity  size was normal. There is no increased left ventricular wall thickness.  Echo evidence of normal diastolic relaxation.  2. The right ventricle has normal systolic function. The cavity was  normal. There is no increase in right ventricular wall thickness. Right  ventricular systolic pressure could not be assessed.  3. Left atrial size was moderately dilated.  4. The mitral valve is normal in structure. There is mild mitral annular  calcification present.  5. The tricuspid valve is normal in structure.  6. The aortic valve is normal in structure.  7. The pulmonic valve was normal in structure.    Nuclear stress test 2019:  Nuclear stress EF: 45%.  The left ventricular ejection fraction is mildly decreased (45-54%).  There was no ST segment deviation noted during stress.  The study is normal.  This is a low risk study.   Low risk stress nuclear study with normal perfusion and mildly reduced left ventricular global systolic function. Compared to 2014, perfusion pattern has normalized, but LVEF has decreased. Possible gating artifact, suggest correlation with echo.   EKG:  EKG is ordered today.  The ekg ordered today demonstrates sinus bradycardia with HR 48 with LVH  Recent Labs: No results found for requested labs within last 8760 hours.  Recent Lipid Panel No results found for: CHOL, TRIG, HDL, CHOLHDL, VLDL, LDLCALC, LDLDIRECT  Physical Exam:    VS:  BP (!) 148/78   Pulse (!) 48   Ht 5' 0.5" (1.537 m)   Wt 233 lb (105.7 kg)   SpO2 96%   BMI 44.76 kg/m     Wt Readings from Last 3 Encounters:  01/10/21 233 lb (105.7 kg)  08/20/20 251 lb (113.9 kg)  07/02/19 248 lb 9.6 oz (112.8 kg)     GEN: elderly obese female in no acute  distress HEENT: Normal NECK: No JVD; No carotid bruits LYMPHATICS: No lymphadenopathy CARDIAC: RRR, no murmurs, rubs, gallops RESPIRATORY:  Clear to auscultation without rales, wheezing or rhonchi  ABDOMEN: Soft, non-tender, non-distended MUSCULOSKELETAL:  No edema; No deformity  SKIN: Warm and dry NEUROLOGIC:  Alert and oriented x 3 PSYCHIATRIC:  Normal affect   ASSESSMENT:    1. Essential hypertension   2. Lymphedema   3. Coronary artery disease involving native coronary artery of native heart without angina pectoris   4. Preoperative clearance    PLAN:    In order of problems listed above:  Hypertension - 100 mg losartan, 12.5 mg HCTZ, and 80 mg propranolol - BP elevated today, rushing in from parking lot, hx of labile BP - monitor at home - they have a new BP cuff   CAD - carries a history of CAD, although normal coronaries on cath 2005 - reassuring NST 2019 - has been taking 162 mg ASA --> advised 81 mg ASA when resumed   Bilateral lymphedema - was in PT at [facility] - continuing wraps at home   Preoperative risk evaluation She does not have a history of ischemic heart disease. She had a low risk myoview in 2019. Echocardiogram and bilateral carotid ultrasound unremarkable in 2020. She does not take insulin and has normal renal function. She did have a questionable TIA, although workup was largely unremarkable. She is unable to complete 4.0 METS. Dr. Sallyanne Kuster has reviewed her chart and recommends no additional cardiac testing prior to surgery. According to the RCRI, she has 0.9% risk of cardiac complications. We prefer to continue ASA throughout the perioperative period. However, if doing so will significantly increase morbidity/mortality, she may hold ASA 5-7 days prior to surgery. Please resume 81 mg ASA when safe to do so.    Follow up with Dr. Sallyanne Kuster in 6 months.   Medication Adjustments/Labs and Tests Ordered: Current medicines are reviewed at length with the  patient today.  Concerns regarding medicines are outlined above.  No orders of the defined types were placed in this encounter.  No orders of the defined types were placed in this encounter.   Signed, Ledora Bottcher, [place]  01/10/2021 2:53 PM    [facility]

## 2021-01-10 ENCOUNTER — Ambulatory Visit (INDEPENDENT_AMBULATORY_CARE_PROVIDER_SITE_OTHER): Payer: Medicare Other | Admitting: Physician Assistant

## 2021-01-10 ENCOUNTER — Other Ambulatory Visit: Payer: Self-pay

## 2021-01-10 ENCOUNTER — Encounter: Payer: Self-pay | Admitting: Physician Assistant

## 2021-01-10 VITALS — BP 148/78 | HR 48 | Ht 60.5 in | Wt 233.0 lb

## 2021-01-10 DIAGNOSIS — I2510 Atherosclerotic heart disease of native coronary artery without angina pectoris: Secondary | ICD-10-CM | POA: Diagnosis not present

## 2021-01-10 DIAGNOSIS — I10 Essential (primary) hypertension: Secondary | ICD-10-CM | POA: Diagnosis not present

## 2021-01-10 DIAGNOSIS — Z01818 Encounter for other preprocedural examination: Secondary | ICD-10-CM

## 2021-01-10 DIAGNOSIS — I890 Lymphedema, not elsewhere classified: Secondary | ICD-10-CM

## 2021-01-10 NOTE — Patient Instructions (Signed)
Medication Instructions:  Resume Aspirin 81 mg after surgery when safe to do so *If you need a refill on your cardiac medications before your next appointment, please call your pharmacy*   Lab Work: No labs If you have labs (blood work) drawn today and your tests are completely normal, you will receive your results only by: Marland Kitchen MyChart Message (if you have MyChart) OR . A paper copy in the mail If you have any lab test that is abnormal or we need to change your treatment, we will call you to review the results.   Testing/Procedures: No Testing   Follow-Up: At [facility], you and your health needs are our priority.  As part of our continuing mission to provide you with exceptional heart care, we have created designated Provider Care Teams.  These Care Teams include your primary Cardiologist (physician) and Advanced Practice Providers (APPs -  Physician Assistants and Nurse Practitioners) who all work together to provide you with the care you need, when you need it.    Your next appointment:   6 month(s)  The format for your next appointment:   In Person  Provider:   Sanda Klein, MD

## 2021-01-11 NOTE — Care Plan (Signed)
Ortho Bundle Case Management Note  Patient Details  Name: Ashley Hanson MRN: [id number] Date of Birth: 1941/11/09     Spoke with patient and daughter prior to surgery. She will discharge to home with daughter to assist. Has equipment at home.  OPPT set up with [facility]. Patient and MD in agreement with plan. Choice offered                 DME Arranged:    DME Agency:     HH Arranged:    HH Agency:     Additional Comments: Please contact me with any questions of if this plan should need to change.  Ladell Heads,  [facility]  [phone] 01/11/2021, 9:49 AM

## 2021-01-12 NOTE — Patient Instructions (Signed)
DUE TO COVID-19 ONLY ONE VISITOR IS ALLOWED TO COME WITH YOU AND STAY IN THE WAITING ROOM ONLY DURING PRE OP AND PROCEDURE DAY OF SURGERY. THE 1 VISITOR  MAY VISIT WITH YOU AFTER SURGERY IN YOUR PRIVATE ROOM DURING VISITING HOURS ONLY!  YOU NEED TO HAVE A COVID 19 TEST ON:P 01/13/21 @ 2:30 PM, THIS TEST MUST BE DONE BEFORE SURGERY,  COVID TESTING SITE [place] [id number], IT IS ON THE RIGHT GOING OUT [address] APPROXIMATELY  2 MINUTES PAST [employer] ON THE RIGHT. ONCE YOUR COVID TEST IS COMPLETED,  PLEASE BEGIN THE QUARANTINE INSTRUCTIONS AS OUTLINED IN YOUR HANDOUT.                Ashley Hanson    Your procedure is scheduled on: 01/17/21   Report to [facility] Main  Entrance   Report to admitting at: 9:30 AM     Call this number if you have problems the morning of surgery [phone]    Remember:   NO SOLID FOOD AFTER MIDNIGHT THE NIGHT PRIOR TO SURGERY. NOTHING BY MOUTH EXCEPT CLEAR LIQUIDS UNTIL: 9:00 AM . PLEASE FINISH ENSURE DRINK PER SURGEON ORDER  WHICH NEEDS TO BE COMPLETED AT: 9:00 AM .  CLEAR LIQUID DIET  Foods Allowed                                                                     Foods Excluded  Coffee and tea, regular and decaf                             liquids that you cannot  Plain Jell-O any favor except red or purple                                           see through such as: Fruit ices (not with fruit pulp)                                     milk, soups, orange juice  Iced Popsicles                                    All solid food Carbonated beverages, regular and diet                                    Cranberry, grape and apple juices Sports drinks like Gatorade Lightly seasoned clear broth or consume(fat free) Sugar, honey syrup  Sample Menu Breakfast                                Lunch  Supper Cranberry juice                    Beef broth                             Chicken broth Jell-O                                     Grape juice                           Apple juice Coffee or tea                        Jell-O                                      Popsicle                                                Coffee or tea                        Coffee or tea  _____________________________________________________________________   BRUSH YOUR TEETH MORNING OF SURGERY AND RINSE YOUR MOUTH OUT, NO CHEWING GUM CANDY OR MINTS.    Take these medicines the morning of surgery with A SIP OF WATER: donepezil,memantine,gabapentin,synthroid,propranolol.Use eye drops as usual.                               You may not have any metal on your body including hair pins and              piercings  Do not wear jewelry, make-up, lotions, powders or perfumes, deodorant             Do not wear nail polish on your fingernails.  Do not shave  48 hours prior to surgery.    Do not bring valuables to the hospital. [name].  Contacts, dentures or bridgework may not be worn into surgery.  Leave suitcase in the car. After surgery it may be brought to your room.     Patients discharged the day of surgery will not be allowed to drive home. IF YOU ARE HAVING SURGERY AND GOING HOME THE SAME DAY, YOU MUST HAVE AN ADULT TO DRIVE YOU HOME AND BE WITH YOU FOR 24 HOURS. YOU MAY GO HOME BY TAXI OR UBER OR ORTHERWISE, BUT AN ADULT MUST ACCOMPANY YOU HOME AND STAY WITH YOU FOR 24 HOURS.  Name and phone number of your driver:  Special Instructions: N/A              Please read over the following fact sheets you were given: ____________________________________________________________________          [facility] - Preparing for Surgery Before surgery, you can play an important role.  Because skin is not sterile, your skin needs to be as free of germs  as possible.  You can reduce the number of germs on your skin by washing with CHG  (chlorahexidine gluconate) soap before surgery.  CHG is an antiseptic cleaner which kills germs and bonds with the skin to continue killing germs even after washing. Please DO NOT use if you have an allergy to CHG or antibacterial soaps.  If your skin becomes reddened/irritated stop using the CHG and inform your nurse when you arrive at Short Stay. Do not shave (including legs and underarms) for at least 48 hours prior to the first CHG shower.  You may shave your face/neck. Please follow these instructions carefully:  1.  Shower with CHG Soap the night before surgery and the  morning of Surgery.  2.  If you choose to wash your hair, wash your hair first as usual with your  normal  shampoo.  3.  After you shampoo, rinse your hair and body thoroughly to remove the  shampoo.                           4.  Use CHG as you would any other liquid soap.  You can apply chg directly  to the skin and wash                       Gently with a scrungie or clean washcloth.  5.  Apply the CHG Soap to your body ONLY FROM THE NECK DOWN.   Do not use on face/ open                           Wound or open sores. Avoid contact with eyes, ears mouth and genitals (private parts).                       Wash face,  Genitals (private parts) with your normal soap.             6.  Wash thoroughly, paying special attention to the area where your surgery  will be performed.  7.  Thoroughly rinse your body with warm water from the neck down.  8.  DO NOT shower/wash with your normal soap after using and rinsing off  the CHG Soap.                9.  Pat yourself dry with a clean towel.            10.  Wear clean pajamas.            11.  Place clean sheets on your bed the night of your first shower and do not  sleep with pets. Day of Surgery : Do not apply any lotions/deodorants the morning of surgery.  Please wear clean clothes to the hospital/surgery center.  FAILURE TO FOLLOW THESE INSTRUCTIONS MAY RESULT IN THE CANCELLATION OF  YOUR SURGERY PATIENT SIGNATURE_________________________________  NURSE SIGNATURE__________________________________  ________________________________________________________________________   [name]  An incentive spirometer is a tool that can help keep your lungs clear and active. This tool measures how well you are filling your lungs with each breath. Taking long deep breaths may help reverse or decrease the chance of developing breathing (pulmonary) problems (especially infection) following:  A long period of time when you are unable to move or be active. BEFORE THE PROCEDURE   If the spirometer includes an indicator to show your best effort, your nurse or  respiratory therapist will set it to a desired goal.  If possible, sit up straight or lean slightly forward. Try not to slouch.  Hold the incentive spirometer in an upright position. INSTRUCTIONS FOR USE  1. Sit on the edge of your bed if possible, or sit up as far as you can in bed or on a chair. 2. Hold the incentive spirometer in an upright position. 3. Breathe out normally. 4. Place the mouthpiece in your mouth and seal your lips tightly around it. 5. Breathe in slowly and as deeply as possible, raising the piston or the ball toward the top of the column. 6. Hold your breath for 3-5 seconds or for as long as possible. Allow the piston or ball to fall to the bottom of the column. 7. Remove the mouthpiece from your mouth and breathe out normally. 8. Rest for a few seconds and repeat Steps 1 through 7 at least 10 times every 1-2 hours when you are awake. Take your time and take a few normal breaths between deep breaths. 9. The spirometer may include an indicator to show your best effort. Use the indicator as a goal to work toward during each repetition. 10. After each set of 10 deep breaths, practice coughing to be sure your lungs are clear. If you have an incision (the cut made at the time of surgery), support your  incision when coughing by placing a pillow or rolled up towels firmly against it. Once you are able to get out of bed, walk around indoors and cough well. You may stop using the incentive spirometer when instructed by your caregiver.  RISKS AND COMPLICATIONS  Take your time so you do not get dizzy or light-headed.  If you are in pain, you may need to take or ask for pain medication before doing incentive spirometry. It is harder to take a deep breath if you are having pain. AFTER USE  Rest and breathe slowly and easily.  It can be helpful to keep track of a log of your progress. Your caregiver can provide you with a simple table to help with this. If you are using the spirometer at home, follow these instructions: [place] IF:   You are having difficultly using the spirometer.  You have trouble using the spirometer as often as instructed.  Your pain medication is not giving enough relief while using the spirometer.  You develop fever of 100.5 F (38.1 C) or higher. SEEK IMMEDIATE MEDICAL CARE IF:   You cough up bloody sputum that had not been present before.  You develop fever of 102 F (38.9 C) or greater.  You develop worsening pain at or near the incision site. MAKE SURE YOU:   Understand these instructions.  Will watch your condition.  Will get help right away if you are not doing well or get worse. Document Released: 03/19/2007 Document Revised: 01/29/2012 Document Reviewed: 05/20/2007 [facility] Patient Information 2014 [place], [place].   ________________________________________________________________________

## 2021-01-12 NOTE — H&P (Signed)
TOTAL HIP ADMISSION H&P  Patient is admitted for left total hip arthroplasty.  Subjective:  Chief Complaint: left hip pain  HPI: Ashley Hanson, 80 y.o. female, has a history of pain and functional disability in the left hip(s) due to arthritis and patient has failed non-surgical conservative treatments for greater than 12 weeks to include NSAID's and/or analgesics, use of assistive devices, weight reduction as appropriate and activity modification.  Onset of symptoms was gradual starting 3 years ago with gradually worsening course since that time.The patient noted no past surgery on the left hip(s).  Patient currently rates pain in the left hip at 10 out of 10 with activity. Patient has night pain, worsening of pain with activity and weight bearing, trendelenberg gait, pain that interfers with activities of daily living and pain with passive range of motion. Patient has evidence of periarticular osteophytes and joint space narrowing by imaging studies. This condition presents safety issues increasing the risk of falls.   There is no current active infection.  Patient Active Problem List   Diagnosis Date Noted  . Memory loss 03/15/2020  . Transient alteration of awareness 02/12/2019  . TIA (transient ischemic attack) 12/16/2018  . Primary osteoarthritis of right hip 06/28/2018  . Osteoarthritis of left hip 06/25/2018  . Muscle pain 03/27/2016  . Mild cognitive impairment 12/28/2015  . Abnormal labor 11/01/2015  . Abnormality of gait 09/27/2015  . Chronic migraine 09/27/2015  . Lumbar back pain 11/24/2013  . Acquired scoliosis 07/03/2013  . Depression 06/10/2013  . Headache 06/10/2013  . Cervical dystonia 06/10/2013  . Choledocholithiasis with chronic cholecystitis 10/04/2012  . Cholelithiasis with acute or chronic cholecystitis 09/06/2012  . History of breast cancer in female 09/06/2012  . Hypertension 09/06/2012  . Coronary artery disease 09/06/2012   Past Medical History:  Diagnosis  Date  . Anginal pain [facility])    sees [name]; reportedly negative stress/echo 2012  . Arthritis    "hands, elbows, shoulders, and back" (06/28/2018)  . Breast cancer, left breast ([place])   . Carpal tunnel syndrome    bilaterally  . Chronic lower back pain   . Complication of anesthesia    "difficutly waking up and hypothermia and stopped breathing"  . Confusion   . Dementia ([place])    "stage 4; w/both long term and short term memory loss" (06/28/2018)  . Depression   . Diverticulosis   . Esophageal reflux   . Hypertension    sees [name]  . Hypothyroidism   . IBS (irritable bowel syndrome)   . IBS (irritable bowel syndrome)   . Melanoma ([place])    2nd digit right hand; bottom of left foot" (06/28/2018)  . Migraines    "stopped having them 3-4 years ago" (06/28/2018)  . Myocardial infarction [facility])    "a mini one; early 2000s maybe"  . Osteoporosis   . PONV (postoperative nausea and vomiting)   . Refusal of blood transfusions as patient is Jehovah's Witness   . Scoliosis     Past Surgical History:  Procedure Laterality Date  . ANKLE SURGERY Left 10/2007   "fell at work; don't know what they did to it"  . BREAST BIOPSY Left "X 2"  . BREAST LUMPECTOMY Left   . BREAST SURGERY Left    "removed scar tissue"  . CARDIAC CATHETERIZATION  X 2  . CARDIOVASCULAR STRESS TEST    . CARPAL TUNNEL RELEASE Right 1995  . CARPAL TUNNEL RELEASE Left   . CATARACT EXTRACTION W/ INTRAOCULAR LENS  IMPLANT, BILATERAL    . CHOLECYSTECTOMY  10/04/2012   Procedure: LAPAROSCOPIC CHOLECYSTECTOMY WITH INTRAOPERATIVE CHOLANGIOGRAM;  Surgeon: Adin Hector, MD;  Location: [place];  Service: General;  Laterality: N/A;  laparosopic cholecystectomy with cholangiogram and repair of umbilical hernia  . COLONOSCOPY  08/30/11  . DILATION AND CURETTAGE OF UTERUS    . DOPPLER ECHOCARDIOGRAPHY     hx   . ERCP  10/05/2012   Procedure: ENDOSCOPIC RETROGRADE CHOLANGIOPANCREATOGRAPHY (ERCP);  Surgeon: Jeryl Columbia, MD;  Location: [facility] ENDOSCOPY;  Service: Endoscopy;  Laterality: N/A;  . ESOPHAGOGASTRODUODENOSCOPY  08/30/11  . HERNIA REPAIR  10/02/2014   "in my stomach; took it out w/gallbladder"  . JOINT REPLACEMENT    . KELOID EXCISION Bilateral    "earlobes"  . KNEE ARTHROSCOPY Right 06/2007  . MELANOMA EXCISION Bilateral    2nd digit right hand; bottom of left foot" (06/28/2018)  . SHOULDER ARTHROSCOPY Right 06/2011  . SKIN GRAFT Right    "related to melanoma restricting my finger bending"  . TOTAL HIP ARTHROPLASTY Right 06/28/2018  . TOTAL HIP ARTHROPLASTY Right 06/28/2018   Procedure: RIGHT TOTAL HIP ARTHROPLASTY ANTERIOR APPROACH;  Surgeon: Frederik Pear, MD;  Location: [place];  Service: Orthopedics;  Laterality: Right;  . TOTAL KNEE ARTHROPLASTY Right 08/2008  . TUBAL LIGATION      No current facility-administered medications for this encounter.   Current Outpatient Medications  Medication Sig Dispense Refill Last Dose  . aspirin EC 81 MG tablet Take 1 tablet (81 mg total) by mouth 2 (two) times daily. 60 tablet 0   . Calcium Carbonate-Vit D-Min (CALCIUM 1200 PO) Take 1 tablet by mouth 2 (two) times daily.     . Cholecalciferol (VITAMIN D-3) 1000 units CAPS Take 1,000 Units by mouth daily.     Marland Kitchen docusate sodium (COLACE) 100 MG capsule Take 100 mg by mouth 2 (two) times daily.     Marland Kitchen donepezil (ARICEPT) 10 MG tablet Take 1 tablet (10 mg total) by mouth every morning. 90 tablet 3   . gabapentin (NEURONTIN) 600 MG tablet Take 600 mg by mouth 3 (three) times daily.     . hydrochlorothiazide (HYDRODIURIL) 12.5 MG tablet Take 12.5 mg by mouth daily.     . hypromellose (GENTEAL) 0.3 % GEL ophthalmic ointment Place 1 application into both eyes at bedtime.     Marland Kitchen levothyroxine (SYNTHROID, LEVOTHROID) 50 MCG tablet Take 50 mcg by mouth daily before breakfast.     . losartan (COZAAR) 100 MG tablet Take 100 mg by mouth daily.      . memantine (NAMENDA) 10 MG tablet Take 1 tablet (10 mg total) by mouth  2 (two) times daily. 180 tablet 3   . Multiple Vitamins-Minerals (PRESERVISION AREDS PO) Take 1 capsule by mouth 2 (two) times daily.      . propranolol (INDERAL) 80 MG tablet Take 80 mg by mouth 2 (two) times daily.     . rosuvastatin (CRESTOR) 20 MG tablet Take 20 mg by mouth at bedtime.     . sodium chloride (MURO 128) 5 % ophthalmic solution Place 1 drop into both eyes 4 (four) times daily.      Marland Kitchen tiZANidine (ZANAFLEX) 2 MG tablet Take 1 tablet (2 mg total) by mouth every 6 (six) hours as needed. (Patient taking differently: Take 2 mg by mouth every 6 (six) hours as needed for muscle spasms.) 60 tablet 0   . traMADol (ULTRAM) 50 MG tablet Take 50 mg by mouth 2 (two)  times daily as needed for severe pain.     Marland Kitchen UNABLE TO FIND Take 1 tablet by mouth 2 (two) times daily. Med Name: NerveRenew      Allergies  Allergen Reactions  . Adhesive [Tape] Dermatitis    Severe irritation of skin  . Plavix [Clopidogrel Bisulfate] Itching and Other (See Comments)    Causes migraines. Itching all over the body.  . Warfarin And Related Other (See Comments)    Blood thinners - cause migraines in patient.    Social History   Tobacco Use  . Smoking status: Never Smoker  . Smokeless tobacco: Never Used  Substance Use Topics  . Alcohol use: Not Currently    Family History  Problem Relation Age of Onset  . Colon polyps Mother   . Breast cancer Sister   . Dementia Sister   . Sleep apnea Daughter      Review of Systems  Constitutional: Positive for diaphoresis.  HENT: Positive for hearing loss and tinnitus.   Eyes:       Poor vision and blurred  Cardiovascular: Positive for leg swelling.       Htn  Gastrointestinal: Positive for abdominal pain and diarrhea.  Endocrine: Negative.   Genitourinary: Positive for frequency.  Musculoskeletal: Positive for arthralgias, joint swelling and myalgias.  Allergic/Immunologic: Negative.   Neurological: Negative.   Hematological: Negative.    Psychiatric/Behavioral: Positive for sleep disturbance.       Depression    Objective:  Physical Exam Constitutional:      Appearance: Normal appearance. She is normal weight.  HENT:     Head: Normocephalic and atraumatic.     Nose: Nose normal.     Mouth/Throat:     Mouth: Mucous membranes are moist.     Pharynx: Oropharynx is clear.  Eyes:     Pupils: Pupils are equal, round, and reactive to light.  Cardiovascular:     Pulses: Normal pulses.  Pulmonary:     Effort: Pulmonary effort is normal.  Musculoskeletal:     Cervical back: Normal range of motion and neck supple.     Right lower leg: Edema present.     Left lower leg: Edema present.     Comments: Today, the patient has a range from approximately 2-3 to 105 on the right knee.  She has no pain with palpation.  No noticeable effusion.  No instability.  Patient has no pain in the right hip with hip flexion extension internal/external rotation or log roll.  Patient's left hip does have pain in the groin with hip flexion extension internal and external rotation.  She is able to internally rotate to approximately 10.  Calves are soft and nontender though she does have edema in bilateral lower extremities.  Neurological:     General: No focal deficit present.     Mental Status: She is alert and oriented to person, place, and time. Mental status is at baseline.  Psychiatric:        Mood and Affect: Mood normal.        Behavior: Behavior normal.        Thought Content: Thought content normal.        Judgment: Judgment normal.     Vital signs in last 24 hours:    Labs:   Estimated body mass index is 44.76 kg/m as calculated from the following:   Height as of 01/10/21: 5' 0.5" (1.537 m).   Weight as of 01/10/21: 105.7 kg.   Imaging Review Plain  radiographs demonstrate AP of the pelvis and crosstable lateral of the left hip are also taken and reviewed in office today.  This shows end-stage bone-on-bone arthritis with  subchondral cyst formation and periarticular osteophyte formation left hip.   Assessment/Plan:  End stage arthritis, left hip(s)  The patient history, physical examination, clinical judgement of the provider and imaging studies are consistent with end stage degenerative joint disease of the left hip(s) and total hip arthroplasty is deemed medically necessary. The treatment options including medical management, injection therapy, arthroscopy and arthroplasty were discussed at length. The risks and benefits of total hip arthroplasty were presented and reviewed. The risks due to aseptic loosening, infection, stiffness, dislocation/subluxation,  thromboembolic complications and other imponderables were discussed.  The patient acknowledged the explanation, agreed to proceed with the plan and consent was signed. Patient is being admitted for inpatient treatment for surgery, pain control, PT, OT, prophylactic antibiotics, VTE prophylaxis, progressive ambulation and ADL's and discharge planning.The patient is planning to be discharged home with home health services    Patient's anticipated LOS is less than 2 midnights, meeting these requirements: - Younger than 72 - Lives within 1 hour of care - Has a competent adult at home to recover with post-op recover - NO history of  - Chronic pain requiring opiods  - Diabetes  - Coronary Artery Disease  - Heart failure  - Heart attack  - Stroke  - DVT/VTE  - Cardiac arrhythmia  - Respiratory Failure/COPD  - Renal failure  - Anemia  - Advanced Liver disease

## 2021-01-13 ENCOUNTER — Other Ambulatory Visit: Payer: Self-pay

## 2021-01-13 ENCOUNTER — Other Ambulatory Visit: Payer: Self-pay | Admitting: Internal Medicine

## 2021-01-13 ENCOUNTER — Ambulatory Visit (HOSPITAL_COMMUNITY)
Admission: RE | Admit: 2021-01-13 | Discharge: 2021-01-13 | Disposition: A | Discharge status: Home / Self Care | Payer: Medicare Other | Source: Ambulatory Visit | Attending: Orthopedic Surgery | Admitting: Orthopedic Surgery

## 2021-01-13 ENCOUNTER — Encounter (HOSPITAL_COMMUNITY): Payer: Self-pay

## 2021-01-13 ENCOUNTER — Encounter (HOSPITAL_COMMUNITY)
Admission: RE | Admit: 2021-01-13 | Discharge: 2021-01-13 | Disposition: A | Discharge status: Home / Self Care | Payer: Medicare Other | Source: Ambulatory Visit | Attending: Orthopedic Surgery | Admitting: Orthopedic Surgery

## 2021-01-13 ENCOUNTER — Other Ambulatory Visit (HOSPITAL_COMMUNITY)
Admission: RE | Admit: 2021-01-13 | Discharge: 2021-01-13 | Disposition: A | Discharge status: Home / Self Care | Payer: Medicare Other | Source: Ambulatory Visit | Attending: Orthopedic Surgery | Admitting: Orthopedic Surgery

## 2021-01-13 DIAGNOSIS — Z01818 Encounter for other preprocedural examination: Secondary | ICD-10-CM

## 2021-01-13 DIAGNOSIS — Z1231 Encounter for screening mammogram for malignant neoplasm of breast: Secondary | ICD-10-CM

## 2021-01-13 DIAGNOSIS — Z20822 Contact with and (suspected) exposure to covid-19: Secondary | ICD-10-CM | POA: Insufficient documentation

## 2021-01-13 LAB — URINALYSIS, ROUTINE W REFLEX MICROSCOPIC
Bilirubin Urine: NEGATIVE
Glucose, UA: NEGATIVE mg/dL
Hgb urine dipstick: NEGATIVE
Ketones, ur: NEGATIVE mg/dL
Leukocytes,Ua: NEGATIVE
Nitrite: NEGATIVE
Protein, ur: NEGATIVE mg/dL
Specific Gravity, Urine: 1.003 — ABNORMAL LOW (ref 1.005–1.030)
pH: 7 (ref 5.0–8.0)

## 2021-01-13 LAB — CBC WITH DIFFERENTIAL/PLATELET
Abs Immature Granulocytes: 0.01 10*3/uL (ref 0.00–0.07)
Basophils Absolute: 0 10*3/uL (ref 0.0–0.1)
Basophils Relative: 1 %
Eosinophils Absolute: 0.3 10*3/uL (ref 0.0–0.5)
Eosinophils Relative: 5 %
HCT: 44.7 % (ref 36.0–46.0)
Hemoglobin: 14.5 g/dL (ref 12.0–15.0)
Immature Granulocytes: 0 %
Lymphocytes Relative: 32 %
Lymphs Abs: 2.2 10*3/uL (ref 0.7–4.0)
MCH: 31 pg (ref 26.0–34.0)
MCHC: 32.4 g/dL (ref 30.0–36.0)
MCV: 95.7 fL (ref 80.0–100.0)
Monocytes Absolute: 0.7 10*3/uL (ref 0.1–1.0)
Monocytes Relative: 9 %
Neutro Abs: 3.7 10*3/uL (ref 1.7–7.7)
Neutrophils Relative %: 53 %
Platelets: 190 10*3/uL (ref 150–400)
RBC: 4.67 MIL/uL (ref 3.87–5.11)
RDW: 12.8 % (ref 11.5–15.5)
WBC: 6.9 10*3/uL (ref 4.0–10.5)
nRBC: 0 % (ref 0.0–0.2)

## 2021-01-13 LAB — PROTIME-INR
INR: 1.2 (ref 0.8–1.2)
Prothrombin Time: 14.9 seconds (ref 11.4–15.2)

## 2021-01-13 LAB — BASIC METABOLIC PANEL
Anion gap: 10 (ref 5–15)
BUN: 13 mg/dL (ref 8–23)
CO2: 27 mmol/L (ref 22–32)
Calcium: 9.4 mg/dL (ref 8.9–10.3)
Chloride: 107 mmol/L (ref 98–111)
Creatinine, Ser: 0.86 mg/dL (ref 0.44–1.00)
GFR, Estimated: >60 mL/min (ref >60)
Glucose, Bld: 105 mg/dL — ABNORMAL HIGH (ref 70–99)
Potassium: 3.8 mmol/L (ref 3.5–5.1)
Sodium: 144 mmol/L (ref 135–145)

## 2021-01-13 LAB — NO BLOOD PRODUCTS

## 2021-01-13 LAB — SURGICAL PCR SCREEN
MRSA, PCR: NEGATIVE
Staphylococcus aureus: NEGATIVE

## 2021-01-13 LAB — APTT: aPTT: 32 seconds (ref 24–36)

## 2021-01-13 NOTE — Progress Notes (Signed)
Pt. Refuse blood products. 

## 2021-01-13 NOTE — Progress Notes (Signed)
COVID Vaccine Completed: NO Date COVID Vaccine completed: COVID vaccine manufacturer: [employer]   PCP - Dr. Cristie Hem Cardiologist -  Dr. Dani Gobble Croitoru: LOV: 12/23/19. Clearance: Augustin Coupe: PA: 01/10/21: EPIC Chest x-ray -  EKG - 01/10/21 Stress Test -  ECHO - 12/26/18 Cardiac Cath -  Pacemaker/ICD device last checked:  Sleep Study -  CPAP -   Fasting Blood Sugar -  Checks Blood Sugar _____ times a day  Blood Thinner Instructions: Aspirin Instructions: Last Dose:  Anesthesia review: Hx: HTN,Angina,MI  Patient denies shortness of breath, fever, cough and chest pain at PAT appointment   Patient verbalized understanding of instructions that were given to them at the PAT appointment. Patient was also instructed that they will need to review over the PAT instructions again at home before surgery.

## 2021-01-14 LAB — TYPE AND SCREEN
ABO/RH(D): O POS
Antibody Screen: NEGATIVE

## 2021-01-14 LAB — SARS CORONAVIRUS 2 (TAT 6-24 HRS): SARS Coronavirus 2: NEGATIVE

## 2021-01-14 NOTE — Progress Notes (Signed)
Anesthesia Chart Review   Case: [id number] Date/Time: 01/17/21 1145   Procedure: LEFT TOTAL HIP ARTHROPLASTY ANTERIOR APPROACH (Left Hip)   Anesthesia type: Spinal   Pre-op diagnosis: LEFT HIP OSTEOARTHRITIS   [facility] ROOM 07 / [facility]   Surgeons: Frederik Pear, MD      DISCUSSION:80 y.o. never smoker with h/o PONV, HTN, TIA, dementia, left hip OA scheduled for above procedure 01/17/21 with Dr. Frederik Pear.   Mobility limited due to chronic pain associated with scoliosis, arthritis, hip, back pain.  Pt ambulates with a walker.    Pt last seen by cardiology 01/10/21. Per OV note, "Preoperative risk evaluation She does not have a history of ischemic heart disease. She had a low risk myoview in 2019. Echocardiogram and bilateral carotid ultrasound unremarkable in 2020. She does not take insulin and has normal renal function. She did have a questionable TIA, although workup was largely unremarkable. She is unable to complete 4.0 METS. Dr. Sallyanne Kuster has reviewed her chart and recommends no additional cardiac testing prior to surgery. According to the RCRI, she has 0.9% risk of cardiac complications. We prefer to continue ASA throughout the perioperative period. However, if doing so will significantly increase morbidity/mortality, she may hold ASA 5-7 days prior to surgery. Please resume 81 mg ASA when safe to do so."  Anticipate pt can proceed with planned procedure barring acute status change.   VS: Pulse 60   Temp 37.2 C (Oral)   Ht 5\' 5"  (1.651 m)   Wt 106.6 kg   SpO2 96%   BMI 39.11 kg/m   PROVIDERS: Michael Boston, MD is PCP   Sanda Klein, MD is Cardiologist  LABS: Labs reviewed: Acceptable for surgery. (all labs ordered are listed, but only abnormal results are displayed)  Labs Reviewed  BASIC METABOLIC PANEL - Abnormal; Notable for the following components:      Result Value   Glucose, Bld 105 (*)    All other components within normal limits  URINALYSIS, ROUTINE W REFLEX  MICROSCOPIC - Abnormal; Notable for the following components:   Color, Urine STRAW (*)    Specific Gravity, Urine 1.003 (*)    All other components within normal limits  SURGICAL PCR SCREEN  CBC WITH DIFFERENTIAL/PLATELET  PROTIME-INR  APTT  TYPE AND SCREEN     IMAGES:   EKG: 01/10/21 Rate 48 bpm  Marked sinus bradycardia Left ventricular hypertrophy with repolarization abnormality  CV: Echo 12/26/2018 IMPRESSIONS    1. The left ventricle has normal systolic function of 98-33%. The cavity  size was normal. There is no increased left ventricular wall thickness.  Echo evidence of normal diastolic relaxation.  2. The right ventricle has normal systolic function. The cavity was  normal. There is no increase in right ventricular wall thickness. Right  ventricular systolic pressure could not be assessed.  3. Left atrial size was moderately dilated.  4. The mitral valve is normal in structure. There is mild mitral annular  calcification present.  5. The tricuspid valve is normal in structure.  6. The aortic valve is normal in structure.  7. The pulmonic valve was normal in structure.  Stress Test 05/16/2018  Nuclear stress EF: 45%.  The left ventricular ejection fraction is mildly decreased (45-54%).  There was no ST segment deviation noted during stress.  The study is normal.  This is a low risk study.   Low risk stress nuclear study with normal perfusion and mildly reduced left ventricular global systolic function. Compared to  2014, perfusion pattern has normalized, but LVEF has decreased. Possible gating artifact, suggest correlation with echo.  Past Medical History:  Diagnosis Date  . Anginal pain [facility])    sees [name]; reportedly negative stress/echo 2012  . Arthritis    "hands, elbows, shoulders, and back" (06/28/2018)  . Breast cancer, left breast ([place])   . Carpal tunnel syndrome    bilaterally  . Chronic lower back pain   . Complication of  anesthesia    "difficutly waking up and hypothermia and stopped breathing"  . Confusion   . Dementia ([place])    "stage 4; w/both long term and short term memory loss" (06/28/2018)  . Depression   . Diverticulosis   . Esophageal reflux   . Hypertension    sees [name]  . Hypothyroidism   . IBS (irritable bowel syndrome)   . IBS (irritable bowel syndrome)   . Melanoma ([place])    2nd digit right hand; bottom of left foot" (06/28/2018)  . Migraines    "stopped having them 3-4 years ago" (06/28/2018)  . Myocardial infarction [facility])    "a mini one; early 2000s maybe"  . Osteoporosis   . PONV (postoperative nausea and vomiting)   . Refusal of blood transfusions as patient is Jehovah's Witness   . Scoliosis     Past Surgical History:  Procedure Laterality Date  . ANKLE SURGERY Left 10/2007   "fell at work; don't know what they did to it"  . BREAST BIOPSY Left "X 2"  . BREAST LUMPECTOMY Left   . BREAST SURGERY Left    "removed scar tissue"  . CARDIAC CATHETERIZATION  X 2  . CARDIOVASCULAR STRESS TEST    . CARPAL TUNNEL RELEASE Right 1995  . CARPAL TUNNEL RELEASE Left   . CATARACT EXTRACTION W/ INTRAOCULAR LENS  IMPLANT, BILATERAL    . CHOLECYSTECTOMY  10/04/2012   Procedure: LAPAROSCOPIC CHOLECYSTECTOMY WITH INTRAOPERATIVE CHOLANGIOGRAM;  Surgeon: Adin Hector, MD;  Location: [place];  Service: General;  Laterality: N/A;  laparosopic cholecystectomy with cholangiogram and repair of umbilical hernia  . COLONOSCOPY  08/30/11  . DILATION AND CURETTAGE OF UTERUS    . DOPPLER ECHOCARDIOGRAPHY     hx   . ERCP  10/05/2012   Procedure: ENDOSCOPIC RETROGRADE CHOLANGIOPANCREATOGRAPHY (ERCP);  Surgeon: Jeryl Columbia, MD;  Location: [facility] ENDOSCOPY;  Service: Endoscopy;  Laterality: N/A;  . ESOPHAGOGASTRODUODENOSCOPY  08/30/11  . HERNIA REPAIR  10/02/2014   "in my stomach; took it out w/gallbladder"  . JOINT REPLACEMENT    . KELOID EXCISION Bilateral    "earlobes"  . KNEE ARTHROSCOPY  Right 06/2007  . MELANOMA EXCISION Bilateral    2nd digit right hand; bottom of left foot" (06/28/2018)  . SHOULDER ARTHROSCOPY Right 06/2011  . SKIN GRAFT Right    "related to melanoma restricting my finger bending"  . TOTAL HIP ARTHROPLASTY Right 06/28/2018  . TOTAL HIP ARTHROPLASTY Right 06/28/2018   Procedure: RIGHT TOTAL HIP ARTHROPLASTY ANTERIOR APPROACH;  Surgeon: Frederik Pear, MD;  Location: [place];  Service: Orthopedics;  Laterality: Right;  . TOTAL KNEE ARTHROPLASTY Right 08/2008  . TUBAL LIGATION      MEDICATIONS: . aspirin EC 81 MG tablet  . Calcium Carbonate-Vit D-Min (CALCIUM 1200 PO)  . Cholecalciferol (VITAMIN D-3) 1000 units CAPS  . docusate sodium (COLACE) 100 MG capsule  . donepezil (ARICEPT) 10 MG tablet  . gabapentin (NEURONTIN) 600 MG tablet  . hydrochlorothiazide (HYDRODIURIL) 12.5 MG tablet  . hypromellose (GENTEAL) 0.3 %  GEL ophthalmic ointment  . levothyroxine (SYNTHROID, LEVOTHROID) 50 MCG tablet  . losartan (COZAAR) 100 MG tablet  . memantine (NAMENDA) 10 MG tablet  . Multiple Vitamins-Minerals (PRESERVISION AREDS PO)  . propranolol (INDERAL) 80 MG tablet  . rosuvastatin (CRESTOR) 20 MG tablet  . sodium chloride (MURO 128) 5 % ophthalmic solution  . tiZANidine (ZANAFLEX) 2 MG tablet  . traMADol (ULTRAM) 50 MG tablet  . UNABLE TO FIND   No current facility-administered medications for this encounter.     Konrad Felix, PA-C [facility] [phone]

## 2021-01-14 NOTE — Anesthesia Preprocedure Evaluation (Addendum)
Anesthesia Evaluation  Patient identified by MRN, date of birth, ID band Patient awake    Reviewed: Allergy & Precautions, NPO status , Patient's Chart, lab work & pertinent test results  History of Anesthesia Complications (+) PONV  Airway Mallampati: II  TM Distance: >3 FB Neck ROM: Full    Dental no notable dental hx. (+) Edentulous Upper, Partial Lower, Dental Advisory Given,    Pulmonary neg pulmonary ROS,    Pulmonary exam normal breath sounds clear to auscultation       Cardiovascular hypertension, Pt. on home beta blockers and Pt. on medications + CAD and + Past MI  Normal cardiovascular exam Rhythm:Regular Rate:Normal  2/20 Echo  1. The left ventricle has normal systolic function of 73-41%. The cavity  size was normal. There is no increased left ventricular wall thickness.  Echo evidence of normal diastolic relaxation.  2. The right ventricle has normal systolic function. The cavity was  normal. There is no increase in right ventricular wall thickness. Right  ventricular systolic pressure could not be assessed.    Neuro/Psych Depression Dementia TIA   GI/Hepatic Neg liver ROS,   Endo/Other  Hypothyroidism   Renal/GU negative Renal ROS     Musculoskeletal  (+) Arthritis ,   Abdominal (+) + obese,   Peds  Hematology Lab Results      Component                Value               Date                      WBC                      6.9                 01/13/2021                HGB                      14.5                01/13/2021                HCT                      44.7                01/13/2021                MCV                      95.7                01/13/2021                PLT                      190                 01/13/2021              Anesthesia Other Findings   Reproductive/Obstetrics                         Anesthesia Physical Anesthesia Plan  ASA:  III  Anesthesia Plan: Spinal   Post-op  Pain Management:    Induction:   PONV Risk Score and Plan: 3 and Treatment may vary due to age or medical condition and Ondansetron  Airway Management Planned: Natural Airway and Nasal Cannula  Additional Equipment: None  Intra-op Plan:   Post-operative Plan:   Informed Consent: I have reviewed the patients History and Physical, chart, labs and discussed the procedure including the risks, benefits and alternatives for the proposed anesthesia with the patient or authorized representative who has indicated his/her understanding and acceptance.     Dental advisory given  Plan Discussed with: CRNA and Anesthesiologist  Anesthesia Plan Comments: (See PAT note 01/13/2021, Konrad Felix, PA-C  )      Anesthesia Quick Evaluation

## 2021-01-16 MED ORDER — TRANEXAMIC ACID 1000 MG/10ML IV SOLN
2000.0000 mg | INTRAVENOUS | Status: DC
  Filled 2021-01-16: qty 20

## 2021-01-16 MED ORDER — BUPIVACAINE LIPOSOME 1.3 % IJ SUSP
10.0000 mL | Freq: Once | INTRAMUSCULAR | Status: DC
  Filled 2021-01-16: qty 10

## 2021-01-17 ENCOUNTER — Ambulatory Visit (HOSPITAL_COMMUNITY): Payer: Medicare Other

## 2021-01-17 ENCOUNTER — Observation Stay (HOSPITAL_COMMUNITY)
Admission: RE | Admit: 2021-01-17 | Discharge: 2021-01-18 | Disposition: A | Discharge status: Home / Self Care | Payer: Medicare Other | Attending: Orthopedic Surgery | Admitting: Orthopedic Surgery

## 2021-01-17 ENCOUNTER — Encounter (HOSPITAL_COMMUNITY)
Admission: RE | Disposition: A | Discharge status: Home / Self Care | Payer: Self-pay | Source: Home / Self Care | Attending: Orthopedic Surgery

## 2021-01-17 ENCOUNTER — Ambulatory Visit (HOSPITAL_COMMUNITY): Payer: Medicare Other | Admitting: Anesthesiology

## 2021-01-17 ENCOUNTER — Ambulatory Visit (HOSPITAL_COMMUNITY): Payer: Medicare Other | Admitting: Physician Assistant

## 2021-01-17 ENCOUNTER — Encounter (HOSPITAL_COMMUNITY): Payer: Self-pay | Admitting: Orthopedic Surgery

## 2021-01-17 ENCOUNTER — Other Ambulatory Visit: Payer: Self-pay

## 2021-01-17 DIAGNOSIS — M1612 Unilateral primary osteoarthritis, left hip: Secondary | ICD-10-CM | POA: Diagnosis not present

## 2021-01-17 DIAGNOSIS — Z79899 Other long term (current) drug therapy: Secondary | ICD-10-CM | POA: Diagnosis not present

## 2021-01-17 DIAGNOSIS — Z96649 Presence of unspecified artificial hip joint: Secondary | ICD-10-CM

## 2021-01-17 DIAGNOSIS — I10 Essential (primary) hypertension: Secondary | ICD-10-CM | POA: Diagnosis not present

## 2021-01-17 DIAGNOSIS — Z79891 Long term (current) use of opiate analgesic: Secondary | ICD-10-CM | POA: Diagnosis not present

## 2021-01-17 DIAGNOSIS — Z8673 Personal history of transient ischemic attack (TIA), and cerebral infarction without residual deficits: Secondary | ICD-10-CM | POA: Diagnosis not present

## 2021-01-17 DIAGNOSIS — Z419 Encounter for procedure for purposes other than remedying health state, unspecified: Secondary | ICD-10-CM

## 2021-01-17 DIAGNOSIS — M25552 Pain in left hip: Secondary | ICD-10-CM | POA: Diagnosis present

## 2021-01-17 DIAGNOSIS — Z791 Long term (current) use of non-steroidal anti-inflammatories (NSAID): Secondary | ICD-10-CM | POA: Diagnosis not present

## 2021-01-17 HISTORY — PX: TOTAL HIP ARTHROPLASTY: SHX124

## 2021-01-17 SURGERY — ARTHROPLASTY, HIP, TOTAL, ANTERIOR APPROACH
Anesthesia: Spinal | Site: Hip | Laterality: Left

## 2021-01-17 MED ORDER — DIPHENHYDRAMINE HCL 12.5 MG/5ML PO ELIX: 12.5000 mg | ORAL_SOLUTION | ORAL | Status: DC | PRN

## 2021-01-17 MED ORDER — ACETAMINOPHEN 10 MG/ML IV SOLN: 1000.0000 mg | Freq: Once | INTRAVENOUS | Status: DC | PRN

## 2021-01-17 MED ORDER — ASPIRIN EC 81 MG PO TBEC
81.0000 mg | DELAYED_RELEASE_TABLET | Freq: Two times a day (BID) | ORAL | Status: DC
  Administered 2021-01-17 – 2021-01-18 (×2): 81 mg via ORAL
  Filled 2021-01-17 (×2): qty 1

## 2021-01-17 MED ORDER — BISACODYL 5 MG PO TBEC: 5.0000 mg | DELAYED_RELEASE_TABLET | Freq: Every day | ORAL | Status: DC | PRN

## 2021-01-17 MED ORDER — FENTANYL CITRATE (PF) 100 MCG/2ML IJ SOLN
25.0000 ug | INTRAMUSCULAR | Status: DC | PRN
Start: 2021-01-17 — End: 2021-01-17
  Administered 2021-01-17: 50 ug via INTRAVENOUS
  Administered 2021-01-17: 25 ug via INTRAVENOUS
  Administered 2021-01-17: 50 ug via INTRAVENOUS
  Administered 2021-01-17: 25 ug via INTRAVENOUS

## 2021-01-17 MED ORDER — ONDANSETRON HCL 4 MG PO TABS: 4.0000 mg | ORAL_TABLET | Freq: Four times a day (QID) | ORAL | Status: DC | PRN

## 2021-01-17 MED ORDER — BUPIVACAINE IN DEXTROSE 0.75-8.25 % IT SOLN
INTRATHECAL | Status: DC | PRN
  Administered 2021-01-17: 1.8 mL via INTRATHECAL

## 2021-01-17 MED ORDER — PROPRANOLOL HCL 80 MG PO TABS
80.0000 mg | ORAL_TABLET | Freq: Two times a day (BID) | ORAL | Status: DC
  Administered 2021-01-18: 80 mg via ORAL
  Filled 2021-01-17 (×3): qty 1

## 2021-01-17 MED ORDER — PROPOFOL 500 MG/50ML IV EMUL
INTRAVENOUS | Status: DC | PRN
  Administered 2021-01-17: 35 ug/kg/min via INTRAVENOUS

## 2021-01-17 MED ORDER — BUPIVACAINE-EPINEPHRINE (PF) 0.25% -1:200000 IJ SOLN
INTRAMUSCULAR | Status: AC
  Filled 2021-01-17: qty 30

## 2021-01-17 MED ORDER — PROPOFOL 500 MG/50ML IV EMUL
INTRAVENOUS | Status: AC
  Filled 2021-01-17: qty 50

## 2021-01-17 MED ORDER — PHENYLEPHRINE 40 MCG/ML (10ML) SYRINGE FOR IV PUSH (FOR BLOOD PRESSURE SUPPORT)
PREFILLED_SYRINGE | INTRAVENOUS | Status: AC
  Filled 2021-01-17: qty 10

## 2021-01-17 MED ORDER — LACTATED RINGERS IV SOLN: INTRAVENOUS | Status: DC

## 2021-01-17 MED ORDER — TRANEXAMIC ACID-NACL 1000-0.7 MG/100ML-% IV SOLN
1000.0000 mg | Freq: Once | INTRAVENOUS | Status: AC
  Administered 2021-01-17: 1000 mg via INTRAVENOUS
  Filled 2021-01-17: qty 100

## 2021-01-17 MED ORDER — DOCUSATE SODIUM 100 MG PO CAPS
100.0000 mg | ORAL_CAPSULE | Freq: Two times a day (BID) | ORAL | Status: DC
  Administered 2021-01-17 – 2021-01-18 (×2): 100 mg via ORAL
  Filled 2021-01-17 (×2): qty 1

## 2021-01-17 MED ORDER — LACTATED RINGERS IV BOLUS
500.0000 mL | Freq: Once | INTRAVENOUS | Status: AC
  Administered 2021-01-17: 500 mL via INTRAVENOUS

## 2021-01-17 MED ORDER — MIDAZOLAM HCL 2 MG/2ML IJ SOLN
INTRAMUSCULAR | Status: AC
  Filled 2021-01-17: qty 2

## 2021-01-17 MED ORDER — ASPIRIN 81 MG PO CHEW: 81.0000 mg | CHEWABLE_TABLET | Freq: Two times a day (BID) | ORAL | Status: DC

## 2021-01-17 MED ORDER — GABAPENTIN 600 MG PO TABS
600.0000 mg | ORAL_TABLET | Freq: Three times a day (TID) | ORAL | Status: DC
  Filled 2021-01-17 (×2): qty 1

## 2021-01-17 MED ORDER — FENTANYL CITRATE (PF) 100 MCG/2ML IJ SOLN
INTRAMUSCULAR | Status: DC | PRN
  Administered 2021-01-17: 25 ug via INTRAVENOUS

## 2021-01-17 MED ORDER — KCL IN DEXTROSE-NACL 20-5-0.45 MEQ/L-%-% IV SOLN
INTRAVENOUS | Status: DC
  Filled 2021-01-17 (×3): qty 1000

## 2021-01-17 MED ORDER — ONDANSETRON HCL 4 MG/2ML IJ SOLN: 4.0000 mg | Freq: Four times a day (QID) | INTRAMUSCULAR | Status: DC | PRN

## 2021-01-17 MED ORDER — GLYCOPYRROLATE PF 0.2 MG/ML IJ SOSY
PREFILLED_SYRINGE | INTRAMUSCULAR | Status: AC
  Filled 2021-01-17: qty 1

## 2021-01-17 MED ORDER — FLEET ENEMA 7-19 GM/118ML RE ENEM: 1.0000 | ENEMA | Freq: Once | RECTAL | Status: DC | PRN

## 2021-01-17 MED ORDER — GABAPENTIN 300 MG PO CAPS
600.0000 mg | ORAL_CAPSULE | Freq: Three times a day (TID) | ORAL | Status: DC
  Administered 2021-01-17 – 2021-01-18 (×4): 600 mg via ORAL
  Filled 2021-01-17 (×4): qty 2

## 2021-01-17 MED ORDER — LACTATED RINGERS IV BOLUS: 250.0000 mL | Freq: Once | INTRAVENOUS | Status: DC

## 2021-01-17 MED ORDER — CEFAZOLIN SODIUM-DEXTROSE 2-4 GM/100ML-% IV SOLN
2.0000 g | INTRAVENOUS | Status: AC
  Administered 2021-01-17: 2 g via INTRAVENOUS
  Filled 2021-01-17: qty 100

## 2021-01-17 MED ORDER — ORAL CARE MOUTH RINSE: 15.0000 mL | Freq: Once | OROMUCOSAL | Status: AC

## 2021-01-17 MED ORDER — SODIUM CHLORIDE 0.9% FLUSH
INTRAVENOUS | Status: DC | PRN
  Administered 2021-01-17: 44 mL

## 2021-01-17 MED ORDER — ONDANSETRON HCL 4 MG/2ML IJ SOLN
INTRAMUSCULAR | Status: DC | PRN
  Administered 2021-01-17: 4 mg via INTRAVENOUS

## 2021-01-17 MED ORDER — DEXAMETHASONE SODIUM PHOSPHATE 10 MG/ML IJ SOLN
10.0000 mg | Freq: Once | INTRAMUSCULAR | Status: AC
  Administered 2021-01-18: 10 mg via INTRAVENOUS
  Filled 2021-01-17: qty 1

## 2021-01-17 MED ORDER — ONDANSETRON HCL 4 MG/2ML IJ SOLN: 4.0000 mg | Freq: Once | INTRAMUSCULAR | Status: DC | PRN

## 2021-01-17 MED ORDER — OXYCODONE HCL 5 MG PO TABS
5.0000 mg | ORAL_TABLET | ORAL | Status: DC | PRN
  Administered 2021-01-17 – 2021-01-18 (×3): 5 mg via ORAL
  Filled 2021-01-17 (×3): qty 1

## 2021-01-17 MED ORDER — POLYETHYLENE GLYCOL 3350 17 G PO PACK: 17.0000 g | PACK | Freq: Every day | ORAL | Status: DC | PRN

## 2021-01-17 MED ORDER — OXYCODONE HCL 5 MG PO TABS: 5.0000 mg | ORAL_TABLET | ORAL | 0 refills | Status: AC | PRN

## 2021-01-17 MED ORDER — GLYCOPYRROLATE 0.2 MG/ML IJ SOLN
0.4000 mg | Freq: Once | INTRAMUSCULAR | Status: AC
  Administered 2021-01-17: 0.4 mg via INTRAVENOUS

## 2021-01-17 MED ORDER — GLYCOPYRROLATE 0.2 MG/ML IJ SOLN
INTRAMUSCULAR | Status: AC
  Filled 2021-01-17: qty 2

## 2021-01-17 MED ORDER — FENTANYL CITRATE (PF) 100 MCG/2ML IJ SOLN
INTRAMUSCULAR | Status: AC
  Filled 2021-01-17: qty 2

## 2021-01-17 MED ORDER — SODIUM CHLORIDE (HYPERTONIC) 5 % OP SOLN
1.0000 [drp] | Freq: Four times a day (QID) | OPHTHALMIC | Status: DC
  Administered 2021-01-17 – 2021-01-18 (×3): 1 [drp] via OPHTHALMIC
  Filled 2021-01-17: qty 15

## 2021-01-17 MED ORDER — TRANEXAMIC ACID-NACL 1000-0.7 MG/100ML-% IV SOLN
1000.0000 mg | INTRAVENOUS | Status: AC
  Administered 2021-01-17: 1000 mg via INTRAVENOUS
  Filled 2021-01-17: qty 100

## 2021-01-17 MED ORDER — BUPIVACAINE LIPOSOME 1.3 % IJ SUSP
INTRAMUSCULAR | Status: DC | PRN
  Administered 2021-01-17: 6 mL

## 2021-01-17 MED ORDER — FENTANYL CITRATE (PF) 100 MCG/2ML IJ SOLN: 25.0000 ug | INTRAMUSCULAR | Status: DC | PRN

## 2021-01-17 MED ORDER — CALCIUM CARBONATE ANTACID 500 MG PO CHEW
2.0000 | CHEWABLE_TABLET | Freq: Two times a day (BID) | ORAL | Status: DC
  Administered 2021-01-17: 400 mg via ORAL
  Filled 2021-01-17 (×2): qty 2

## 2021-01-17 MED ORDER — VITAMIN D 25 MCG (1000 UNIT) PO TABS
1000.0000 [IU] | ORAL_TABLET | Freq: Every day | ORAL | Status: DC
  Administered 2021-01-17 – 2021-01-18 (×2): 1000 [IU] via ORAL
  Filled 2021-01-17 (×2): qty 1

## 2021-01-17 MED ORDER — PHENYLEPHRINE HCL-NACL 10-0.9 MG/250ML-% IV SOLN
INTRAVENOUS | Status: DC | PRN
  Administered 2021-01-17: 25 ug/min via INTRAVENOUS

## 2021-01-17 MED ORDER — TRANEXAMIC ACID 1000 MG/10ML IV SOLN
INTRAVENOUS | Status: DC | PRN
  Administered 2021-01-17: 2000 mg via TOPICAL

## 2021-01-17 MED ORDER — OXYCODONE HCL 5 MG PO TABS
5.0000 mg | ORAL_TABLET | Freq: Once | ORAL | Status: AC
  Administered 2021-01-17: 5 mg via ORAL

## 2021-01-17 MED ORDER — OCUVITE-LUTEIN PO CAPS
1.0000 | ORAL_CAPSULE | Freq: Two times a day (BID) | ORAL | Status: DC
  Filled 2021-01-17: qty 1

## 2021-01-17 MED ORDER — PROPOFOL 10 MG/ML IV BOLUS
INTRAVENOUS | Status: AC
  Filled 2021-01-17: qty 20

## 2021-01-17 MED ORDER — CHLORHEXIDINE GLUCONATE 0.12 % MT SOLN
15.0000 mL | Freq: Once | OROMUCOSAL | Status: AC
  Administered 2021-01-17: 15 mL via OROMUCOSAL

## 2021-01-17 MED ORDER — ACETAMINOPHEN 10 MG/ML IV SOLN
1000.0000 mg | Freq: Once | INTRAVENOUS | Status: DC | PRN
  Administered 2021-01-17: 1000 mg via INTRAVENOUS

## 2021-01-17 MED ORDER — PHENYLEPHRINE 40 MCG/ML (10ML) SYRINGE FOR IV PUSH (FOR BLOOD PRESSURE SUPPORT)
PREFILLED_SYRINGE | INTRAVENOUS | Status: DC | PRN
  Administered 2021-01-17: 120 ug via INTRAVENOUS

## 2021-01-17 MED ORDER — ROSUVASTATIN CALCIUM 20 MG PO TABS
20.0000 mg | ORAL_TABLET | Freq: Every day | ORAL | Status: DC
  Administered 2021-01-17: 20 mg via ORAL
  Filled 2021-01-17: qty 1

## 2021-01-17 MED ORDER — PANTOPRAZOLE SODIUM 40 MG PO TBEC
40.0000 mg | DELAYED_RELEASE_TABLET | Freq: Every day | ORAL | Status: DC
  Administered 2021-01-17 – 2021-01-18 (×2): 40 mg via ORAL
  Filled 2021-01-17 (×2): qty 1

## 2021-01-17 MED ORDER — LOSARTAN POTASSIUM 50 MG PO TABS
100.0000 mg | ORAL_TABLET | Freq: Every day | ORAL | Status: DC
  Administered 2021-01-17 – 2021-01-18 (×2): 100 mg via ORAL
  Filled 2021-01-17 (×2): qty 2

## 2021-01-17 MED ORDER — PROSIGHT PO TABS
1.0000 | ORAL_TABLET | Freq: Two times a day (BID) | ORAL | Status: DC
  Administered 2021-01-17 – 2021-01-18 (×2): 1 via ORAL
  Filled 2021-01-17 (×2): qty 1

## 2021-01-17 MED ORDER — EPHEDRINE 5 MG/ML INJ
INTRAVENOUS | Status: AC
  Filled 2021-01-17: qty 10

## 2021-01-17 MED ORDER — METOCLOPRAMIDE HCL 5 MG/ML IJ SOLN: 5.0000 mg | Freq: Three times a day (TID) | INTRAMUSCULAR | Status: DC | PRN

## 2021-01-17 MED ORDER — POVIDONE-IODINE 10 % EX SWAB
2.0000 "application " | Freq: Once | CUTANEOUS | Status: AC
  Administered 2021-01-17: 2 via TOPICAL

## 2021-01-17 MED ORDER — HYDROCHLOROTHIAZIDE 25 MG PO TABS
12.5000 mg | ORAL_TABLET | Freq: Every day | ORAL | Status: DC
  Administered 2021-01-18: 12.5 mg via ORAL
  Filled 2021-01-17: qty 1

## 2021-01-17 MED ORDER — PROPOFOL 10 MG/ML IV BOLUS
INTRAVENOUS | Status: DC | PRN
  Administered 2021-01-17 (×2): 20 mg via INTRAVENOUS

## 2021-01-17 MED ORDER — LEVOTHYROXINE SODIUM 50 MCG PO TABS
50.0000 ug | ORAL_TABLET | Freq: Every day | ORAL | Status: DC
  Administered 2021-01-18: 50 ug via ORAL
  Filled 2021-01-17: qty 1

## 2021-01-17 MED ORDER — TIZANIDINE HCL 4 MG PO TABS
2.0000 mg | ORAL_TABLET | Freq: Four times a day (QID) | ORAL | Status: DC | PRN
  Administered 2021-01-17: 2 mg via ORAL
  Filled 2021-01-17: qty 1

## 2021-01-17 MED ORDER — HYDROMORPHONE HCL 1 MG/ML IJ SOLN
0.5000 mg | INTRAMUSCULAR | Status: DC | PRN
  Administered 2021-01-17 – 2021-01-18 (×3): 0.5 mg via INTRAVENOUS
  Filled 2021-01-17 (×3): qty 0.5

## 2021-01-17 MED ORDER — ARTIFICIAL TEARS OPHTHALMIC OINT
1.0000 "application " | TOPICAL_OINTMENT | Freq: Every day | OPHTHALMIC | Status: DC
  Administered 2021-01-17: 1 via OPHTHALMIC
  Filled 2021-01-17: qty 3.5

## 2021-01-17 MED ORDER — DONEPEZIL HCL 10 MG PO TABS
10.0000 mg | ORAL_TABLET | ORAL | Status: DC
  Administered 2021-01-18: 10 mg via ORAL
  Filled 2021-01-17: qty 1

## 2021-01-17 MED ORDER — MEMANTINE HCL 10 MG PO TABS
10.0000 mg | ORAL_TABLET | Freq: Two times a day (BID) | ORAL | Status: DC
  Administered 2021-01-17 – 2021-01-18 (×2): 10 mg via ORAL
  Filled 2021-01-17 (×2): qty 1

## 2021-01-17 MED ORDER — METOCLOPRAMIDE HCL 5 MG PO TABS: 5.0000 mg | ORAL_TABLET | Freq: Three times a day (TID) | ORAL | Status: DC | PRN

## 2021-01-17 MED ORDER — ONDANSETRON HCL 4 MG/2ML IJ SOLN
INTRAMUSCULAR | Status: AC
  Filled 2021-01-17: qty 2

## 2021-01-17 MED ORDER — OXYCODONE HCL 5 MG PO TABS
ORAL_TABLET | ORAL | Status: AC
  Filled 2021-01-17: qty 1

## 2021-01-17 MED ORDER — BUPIVACAINE-EPINEPHRINE 0.25% -1:200000 IJ SOLN
INTRAMUSCULAR | Status: DC | PRN
  Administered 2021-01-17: 27 mL

## 2021-01-17 MED ORDER — MENTHOL 3 MG MT LOZG: 1.0000 | LOZENGE | OROMUCOSAL | Status: DC | PRN

## 2021-01-17 MED ORDER — PHENOL 1.4 % MT LIQD: 1.0000 | OROMUCOSAL | Status: DC | PRN

## 2021-01-17 MED ORDER — 0.9 % SODIUM CHLORIDE (POUR BTL) OPTIME
TOPICAL | Status: DC | PRN
  Administered 2021-01-17: 1000 mL

## 2021-01-17 MED ORDER — CALCIUM 1200 1200-1000 MG-UNIT PO CHEW: CHEWABLE_TABLET | Freq: Two times a day (BID) | ORAL | Status: DC

## 2021-01-17 MED ORDER — EPHEDRINE SULFATE-NACL 50-0.9 MG/10ML-% IV SOSY
PREFILLED_SYRINGE | INTRAVENOUS | Status: DC | PRN
  Administered 2021-01-17 (×3): 10 mg via INTRAVENOUS

## 2021-01-17 MED ORDER — PHENYLEPHRINE HCL-NACL 10-0.9 MG/250ML-% IV SOLN
INTRAVENOUS | Status: AC
  Filled 2021-01-17: qty 250

## 2021-01-17 MED ORDER — ACETAMINOPHEN 10 MG/ML IV SOLN
INTRAVENOUS | Status: AC
  Filled 2021-01-17: qty 100

## 2021-01-17 SURGICAL SUPPLY — 50 items
BAG DECANTER FOR FLEXI CONT (MISCELLANEOUS) ×3 IMPLANT
BLADE SAW SGTL 18X1.27X75 (BLADE) ×2 IMPLANT
BLADE SURG SZ10 CARB STEEL (BLADE) ×4 IMPLANT
CNTNR URN SCR LID CUP LEK RST (MISCELLANEOUS) ×1 IMPLANT
CONT SPEC 4OZ STRL OR WHT (MISCELLANEOUS) ×2
COVER PERINEAL POST (MISCELLANEOUS) ×2 IMPLANT
COVER SURGICAL LIGHT HANDLE (MISCELLANEOUS) ×2 IMPLANT
COVER WAND RF STERILE (DRAPES) ×1 IMPLANT
CUP ACETBLR 48 OD 100 SERIES (Hips) ×1 IMPLANT
DECANTER SPIKE VIAL GLASS SM (MISCELLANEOUS) ×2 IMPLANT
DRAPE ORTHO SPLIT 77X108 STRL (DRAPES)
DRAPE STERI IOBAN 125X83 (DRAPES) ×2 IMPLANT
DRAPE SURG ORHT 6 SPLT 77X108 (DRAPES) IMPLANT
DRAPE U-SHAPE 47X51 STRL (DRAPES) ×4 IMPLANT
DRSG AQUACEL AG ADV 3.5X10 (GAUZE/BANDAGES/DRESSINGS) ×2 IMPLANT
DURAPREP 26ML APPLICATOR (WOUND CARE) ×2 IMPLANT
ELECT BLADE TIP CTD 4 INCH (ELECTRODE) ×2 IMPLANT
ELECT REM PT RETURN 15FT ADLT (MISCELLANEOUS) ×2 IMPLANT
ELIMINATOR HOLE APEX DEPUY (Hips) ×1 IMPLANT
FEM STEM 12/14 TAPER SZ 4 HIP (Orthopedic Implant) ×2 IMPLANT
FEMORAL STEM 12/14 TPR SZ4 HIP (Orthopedic Implant) IMPLANT
GLOVE SRG 8 PF TXTR STRL LF DI (GLOVE) ×1 IMPLANT
GLOVE SURG ENC MOIS LTX SZ7.5 (GLOVE) ×2 IMPLANT
GLOVE SURG ENC MOIS LTX SZ8.5 (GLOVE) ×2 IMPLANT
GLOVE SURG UNDER POLY LF SZ8 (GLOVE) ×2
GLOVE SURG UNDER POLY LF SZ9 (GLOVE) ×2 IMPLANT
GOWN STRL REUS W/TWL XL LVL3 (GOWN DISPOSABLE) ×4 IMPLANT
HEAD FEM STD 32X+1 STRL (Hips) ×1 IMPLANT
HOLDER FOLEY CATH W/STRAP (MISCELLANEOUS) ×2 IMPLANT
KIT TURNOVER KIT A (KITS) ×2 IMPLANT
MANIFOLD NEPTUNE II (INSTRUMENTS) ×2 IMPLANT
NDL HYPO 21X1.5 SAFETY (NEEDLE) ×2 IMPLANT
NEEDLE HYPO 21X1.5 SAFETY (NEEDLE) ×4 IMPLANT
NS IRRIG 1000ML POUR BTL (IV SOLUTION) ×2 IMPLANT
PACK ANTERIOR HIP CUSTOM (KITS) ×2 IMPLANT
PENCIL SMOKE EVACUATOR (MISCELLANEOUS) ×1 IMPLANT
PINN ALTRX NEUT ID X OD 32X48 ×1 IMPLANT
SUT ETHIBOND NAB CT1 #1 30IN (SUTURE) ×2 IMPLANT
SUT VIC AB 0 CT1 27 (SUTURE)
SUT VIC AB 0 CT1 27XBRD ANBCTR (SUTURE) IMPLANT
SUT VIC AB 1 CTX 36 (SUTURE) ×2
SUT VIC AB 1 CTX36XBRD ANBCTR (SUTURE) ×1 IMPLANT
SUT VIC AB 2-0 CT1 27 (SUTURE)
SUT VIC AB 2-0 CT1 TAPERPNT 27 (SUTURE) IMPLANT
SUT VIC AB 3-0 CT1 27 (SUTURE) ×2
SUT VIC AB 3-0 CT1 TAPERPNT 27 (SUTURE) ×1 IMPLANT
SYR CONTROL 10ML LL (SYRINGE) ×6 IMPLANT
TRAY CATH INTERMITTENT SS 16FR (CATHETERS) ×1 IMPLANT
TRAY FOLEY MTR SLVR 16FR STAT (SET/KITS/TRAYS/PACK) IMPLANT
YANKAUER SUCT BULB TIP NO VENT (SUCTIONS) ×1 IMPLANT

## 2021-01-17 NOTE — Anesthesia Postprocedure Evaluation (Signed)
Anesthesia Post Note  Patient: Ashley Hanson  Procedure(s) Performed: LEFT TOTAL HIP ARTHROPLASTY ANTERIOR APPROACH (Left Hip)     Patient location during evaluation: Nursing Unit Anesthesia Type: Spinal Level of consciousness: oriented and awake and alert Pain management: pain level controlled Vital Signs Assessment: post-procedure vital signs reviewed and stable Respiratory status: spontaneous breathing and respiratory function stable Cardiovascular status: blood pressure returned to baseline and stable Postop Assessment: no headache, no backache, no apparent nausea or vomiting and patient able to bend at knees Anesthetic complications: no   No complications documented.  Last Vitals:  Vitals:   01/17/21 1445 01/17/21 1500  BP: 138/75 (!) 119/56  Pulse: (!) 49 (!) 58  Resp: 16 18  Temp:    SpO2: 100% 100%    Last Pain:  Vitals:   01/17/21 1500  TempSrc:   PainSc: 7                  [place]

## 2021-01-17 NOTE — Transfer of Care (Signed)
Immediate Anesthesia Transfer of Care Note  Patient: Ashley Hanson  Procedure(s) Performed: LEFT TOTAL HIP ARTHROPLASTY ANTERIOR APPROACH (Left Hip)  Patient Location: PACU  Anesthesia Type:Spinal  Level of Consciousness: awake, alert  and patient cooperative  Airway & Oxygen Therapy: Patient Spontanous Breathing and Patient connected to face mask oxygen  Post-op Assessment: Report given to RN and Post -op Vital signs reviewed and stable  Post vital signs: Reviewed and stable  Last Vitals:  Vitals Value Taken Time  BP 104/63 01/17/21 1210  Temp    Pulse 54 01/17/21 1212  Resp 16 01/17/21 1212  SpO2 100 % 01/17/21 1212  Vitals shown include unvalidated device data.  Last Pain:  Vitals:   01/17/21 0904  TempSrc: Oral  PainSc:          Complications: No complications documented.

## 2021-01-17 NOTE — Anesthesia Procedure Notes (Signed)
Procedure Name: MAC Date/Time: 01/17/2021 9:47 AM Performed by: Lollie Sails, CRNA Pre-anesthesia Checklist: Patient identified, Emergency Drugs available, Suction available, Patient being monitored and Timeout performed Oxygen Delivery Method: Simple face mask

## 2021-01-17 NOTE — Progress Notes (Signed)
Dr. Mayer Camel called about patient's pain level not being controlled enough to go home. Dr. Mayer Camel okay for patient to stay the night. Will continue to monitor and report to oncoming RN.  Eugene Garnet, RN

## 2021-01-17 NOTE — Progress Notes (Signed)
PT Cancellation Note  Patient Details Name: Nora Sabey MRN: [id number] DOB: 1941/07/23   Cancelled Treatment:    Reason Eval/Treat Not Completed: Pain limiting ability to participate (pt declined therapy today 2/2 increased pain levels. Acute therapy to follow up as pt becomes appropriate and schedule allows.)  Elna Breslow, SPT  Acute rehab   Elna Breslow 01/17/2021, 6:59 PM

## 2021-01-17 NOTE — Op Note (Signed)
PATIENT ID:      Ashley Hanson  MRN:     [id number] DOB/AGE:    January 23, 1941 / 80 y.o.  OPERATIVE REPORT   DATE OF PROCEDURE:  01/17/2021      PREOPERATIVE DIAGNOSIS:  LEFT HIP OSTEOARTHRITIS                                                         POSTOPERATIVE DIAGNOSIS:  Same                                                         PROCEDURE: Anterior L total hip arthroplasty using a 48 mm DePuy Pinnacle  Cup, [employer], 0-degree polyethylene liner, a +1 mm x 89mm metal head, a 4hi Depuy Actis stem  SURGEON: [name]  ASSISTANT:   Kerry Hough. [employer]  (present throughout entire procedure and necessary for timely completion of the procedure)   ANESTHESIA: Spinal, Exparel 133mg  injection BLOOD LOSS: 250 cc FLUID REPLACEMENT: 1600 cc crystalloid TRANEXAMIC ACID: 1gm IV, 2gm Topical COMPLICATIONS: none    INDICATIONS FOR PROCEDURE: A 80 y.o. year-old With  LEFT HIP OSTEOARTHRITIS   for 3 years, x-rays show bone-on-bone arthritic changes, and osteophytes. Despite conservative measures with observation, anti-inflammatory medicine, narcotics, use of a cane, has severe unremitting pain and can ambulate only a few blocks before resting. Patient desires elective L total hip arthroplasty to decrease pain and increase function. The risks, benefits, and alternatives were discussed at length including but not limited to the risks of infection, bleeding, nerve injury, stiffness, blood clots, the need for revision surgery, cardiopulmonary complications, among others, and they were willing to proceed. Questions answered      PROCEDURE IN DETAIL: The patient was identified by armband,   received preoperative IV antibiotics in the holding area at [facility], taken to the operating room , appropriate anesthetic monitors   were attached and anesthesia was induced with the patient on the gurney. HANA boots were applied to the feet, and the patient  was transferred to the HANA table with  a peroneal post and support underneath the non-operative leg. Theoperative lower extremity was then prepped and draped in the usual sterile fashion from just above the iliac crest to the knee. And a timeout procedure was performed. Kerry Hough. Hardin Negus [facility] was present and scrubbed throughout the case, critical for assistance with, positioning, exposure, retraction, instrumentation, and closure.Skin along incision area was injected with 10 cc of Exparel solution. We then made a 14 cm incision along the interval at the leading edge of the tensor fascia lata of starting at 2 cm lateral to the ASIS. Small bleeders in the skin and subcutaneous tissue identified and cauterized we dissected down to the fascia and made an incision in the fascia allowing [place] to elevate the fascia of the tensor muscle and exploited the interval between the rectus and the tensor fascia lata. A Cobra retractor was then placed along the superior neck of the femur. A cerebellar retractor was used to expose the interval between the tensor fascia lata and the rectus femoris.  We identified and cauterized  the ascending branch of the anterior circumflex artery. A second Cobra retractor along the inferior neck of the femur. A small Hohmann retractor was placed underneath the origin of the rectus femoris, giving [place] good medial exposure. Using Ronguers fatty tissue was removed from in front of the anterior capsule. The capsule was then incised, starting out at the superior anterior rim of the acetabulum going laterally along the anterior neck. The capsule was then teed along the neck superiorly and inferiorly. Electrocautery was used to release capsule from the anterior and medial neck of the femur to allow external rotation. Cobra retractors were then placed along the inferior and superior neck allowing [place] to perform a standard neck cut and removed the femoral head with a power corkscrew. We then placed a medium bent homan retractor in the cotyloid notch  and posteriorly along the acetabular rim a narrow Cobra retractor. Exposed labral tissue and osteophytes were then removed. We then sequentially reamed up to a 47 mm basket reamer obtaining good coverage in all quadrants, verified by C-arm imaging. Under C-arm control we then hammered into place a 52 mm Pinnacle cup in 45 of abduction and 15 of anteversion. The cup seated nicely and required no supplemental screws. We then placed a central hole Eliminator and a 0 polyethylene liner. The foot was then externally rotated to 130-140. The limb was extended and adducted to the floor, delivering the proximal femur up into the wound. A medium curved Hohmann retractor was placed over the greater trochanter and a long Homan retractor along the posterior femoral neck completing the exposure and lateralizing the femur. We then performed releases superiorly and and inferiorly of the capsule going back to the pirformis fossa superiorly and to the lesser trochanter inferiorly. We then entered the proximal femur with the box cutting offset chisel followed by, a canal sounder, the chili pepper and broaching up to a 4 broach. This seated nicely and we reamed the calcar. A trial reduction was performed with a 1 mm X 32 mm head.The limb lengths were excellent the hip was stable in 90 of external rotation. At this point the trial components removed and we hammered into place a # 4 hi  Offset Actis stem with Gryption coating. A + 1 mm x 32 head was then hammered into place. The hip was reduced and final C-arm images obtained. The wound was thoroughly irrigated with normal saline solution. We repaired the ant capsule and the tensor fascia lot a with running 0 vicryl suture. the subcutaneous tissue was closed with 2-0 and 3-0 Vicryl suture followed by an Aquacil dressing. At this point the patient was awaken and transferred to hospital gurney without difficulty.   [name] 01/17/2021, 8:25 AM

## 2021-01-17 NOTE — Anesthesia Procedure Notes (Signed)
Spinal  Patient location during procedure: OR Start time: 01/17/2021 9:52 AM End time: 01/17/2021 9:58 AM Staffing Performed: resident/CRNA  Resident/CRNA: Lollie Sails, CRNA Preanesthetic Checklist Completed: patient identified, IV checked, site marked, risks and benefits discussed, surgical consent, monitors and equipment checked, pre-op evaluation and timeout performed Spinal Block Patient position: sitting Prep: DuraPrep and site prepped and draped Patient monitoring: heart rate, continuous pulse ox, blood pressure and cardiac monitor Approach: midline Location: L4-5 Injection technique: single-shot Needle Needle type: Sprotte  Needle gauge: 24 G Needle length: 10 cm Additional Notes Expiration date on kit noted and within range.  Sterile prep and drape.  Good CSF flow noted with no heme or c/o paresthesia.   Patient tolerated well.

## 2021-01-17 NOTE — Interval H&P Note (Signed)
History and Physical Interval Note:  01/17/2021 8:25 AM  Ashley Hanson  has presented today for surgery, with the diagnosis of LEFT HIP OSTEOARTHRITIS.  The various methods of treatment have been discussed with the patient and family. After consideration of risks, benefits and other options for treatment, the patient has consented to  Procedure(s): LEFT TOTAL HIP ARTHROPLASTY ANTERIOR APPROACH (Left) as a surgical intervention.  The patient's history has been reviewed, patient examined, no change in status, stable for surgery.  I have reviewed the patient's chart and labs.  Questions were answered to the patient's satisfaction.     [name]

## 2021-01-18 ENCOUNTER — Encounter (HOSPITAL_COMMUNITY): Payer: Self-pay | Admitting: Orthopedic Surgery

## 2021-01-18 DIAGNOSIS — M1612 Unilateral primary osteoarthritis, left hip: Secondary | ICD-10-CM | POA: Diagnosis not present

## 2021-01-18 LAB — BASIC METABOLIC PANEL
Anion gap: 11 (ref 5–15)
BUN: 11 mg/dL (ref 8–23)
CO2: 26 mmol/L (ref 22–32)
Calcium: 8.6 mg/dL — ABNORMAL LOW (ref 8.9–10.3)
Chloride: 105 mmol/L (ref 98–111)
Creatinine, Ser: 0.91 mg/dL (ref 0.44–1.00)
GFR, Estimated: >60 mL/min (ref >60)
Glucose, Bld: 133 mg/dL — ABNORMAL HIGH (ref 70–99)
Potassium: 3.7 mmol/L (ref 3.5–5.1)
Sodium: 142 mmol/L (ref 135–145)

## 2021-01-18 LAB — CBC
HCT: 41.6 % (ref 36.0–46.0)
Hemoglobin: 13 g/dL (ref 12.0–15.0)
MCH: 31.6 pg (ref 26.0–34.0)
MCHC: 31.3 g/dL (ref 30.0–36.0)
MCV: 101.2 fL — ABNORMAL HIGH (ref 80.0–100.0)
Platelets: 133 10*3/uL — ABNORMAL LOW (ref 150–400)
RBC: 4.11 MIL/uL (ref 3.87–5.11)
RDW: 12.9 % (ref 11.5–15.5)
WBC: 9.8 10*3/uL (ref 4.0–10.5)
nRBC: 0 % (ref 0.0–0.2)

## 2021-01-18 MED ORDER — OXYCODONE HCL 5 MG PO TABS
10.0000 mg | ORAL_TABLET | ORAL | Status: DC | PRN
  Administered 2021-01-18 (×2): 10 mg via ORAL
  Filled 2021-01-18 (×2): qty 2

## 2021-01-18 NOTE — Evaluation (Signed)
Physical Therapy Evaluation Patient Details Name: Ashley Hanson MRN: [id number] DOB: Mar 22, 1941 Today's Date: 01/18/2021   History of Present Illness  80 yo female s/p L DA THA. PMH: s/p R DA THA, LBP, scoliosis, dementia, HTN, breast CA, OA, RA  Clinical Impression  Pt is s/p THA resulting in the deficits listed below (see PT Problem List).  Pt will benefit from skilled PT to increase their independence and safety with mobility to allow discharge to the venue listed below.      Follow Up Recommendations Follow surgeon's recommendation for DC plan and follow-up therapies    Equipment Recommendations  None recommended by PT    Recommendations for Other Services       Precautions / Restrictions Precautions Precautions: Fall Restrictions Weight Bearing Restrictions: No Other Position/Activity Restrictions: WBAT      Mobility  Bed Mobility Overal bed mobility: Needs Assistance Bed Mobility: Supine to Sit     Supine to sit: Min assist;HOB elevated     General bed mobility comments: assist with LLE    Transfers Overall transfer level: Needs assistance Equipment used: Rolling walker (2 wheeled) Transfers: Sit to/from Stand Sit to Stand: Min assist;From elevated surface         General transfer comment: cues for hand placement  Ambulation/Gait Ambulation/Gait assistance: Min assist;Min guard Gait Distance (Feet): 14 Feet Assistive device: Rolling walker (2 wheeled) Gait Pattern/deviations: Step-to pattern;Trunk flexed     General Gait Details: cues for sequence, RW safety  Stairs            Wheelchair Mobility    Modified Rankin (Stroke Patients Only)       Balance                                             Pertinent Vitals/Pain Pain Assessment: 0-10 Pain Score: 8  Pain Location: L hip Pain Descriptors / Indicators: Grimacing;Sore Pain Intervention(s): Limited activity within patient's tolerance;Monitored during  session;Premedicated before session;Repositioned    Home Living Family/patient expects to be discharged to:: Private residence Living Arrangements: Children Available Help at Discharge: Family Type of Home: Apartment Home Access: Level entry     Home Layout: One level Home Equipment: Cane - single point;Walker - 2 wheels;Wheelchair - manual      Prior Function Level of Independence: Needs assistance   Gait / Transfers Assistance Needed: amb with RW household distances. w/c for longer distances           Hand Dominance        Extremity/Trunk Assessment   Upper Extremity Assessment Upper Extremity Assessment: Generalized weakness    Lower Extremity Assessment Lower Extremity Assessment: LLE deficits/detail LLE Deficits / Details: AAROM grossly WFL, strength grossly 2+/5, limited bypost op pain and weakness       Communication   Communication: No difficulties  Cognition Arousal/Alertness: Awake/alert Behavior During Therapy: WFL for tasks assessed/performed Overall Cognitive Status: Within Functional Limits for tasks assessed                                        General Comments      Exercises Total Joint Exercises Ankle Circles/Pumps: AROM;Both;10 reps   Assessment/Plan    PT Assessment Patient needs continued PT services  PT Problem List Decreased strength;Decreased mobility;Decreased range of  motion;Decreased activity tolerance;Decreased knowledge of use of DME;Pain;Decreased balance;Obesity       PT Treatment Interventions DME instruction;Therapeutic activities;Gait training;Therapeutic exercise;Stair training;Functional mobility training;Patient/family education    PT Goals (Current goals can be found in the Care Plan section)  Acute Rehab PT Goals Patient Stated Goal: less pain PT Goal Formulation: With patient Time For Goal Achievement: 01/25/21 Potential to Achieve Goals: Good    Frequency 7X/week   Barriers to discharge         Co-evaluation               AM-PAC PT "6 Clicks" Mobility  Outcome Measure Help needed turning from your back to your side while in a flat bed without using bedrails?: A Little Help needed moving from lying on your back to sitting on the side of a flat bed without using bedrails?: A Little Help needed moving to and from a bed to a chair (including a wheelchair)?: A Little Help needed standing up from a chair using your arms (e.g., wheelchair or bedside chair)?: A Little Help needed to walk in hospital room?: A Little Help needed climbing 3-5 steps with a railing? : A Lot 6 Click Score: 17    End of Session Equipment Utilized During Treatment: Gait belt Activity Tolerance: Patient tolerated treatment well Patient left: in chair;with call bell/phone within reach;with family/visitor present;with chair alarm set Nurse Communication: Mobility status PT Visit Diagnosis: Other abnormalities of gait and mobility (R26.89);Difficulty in walking, not elsewhere classified (R26.2)    Time: 6553-7482 PT Time Calculation (min) (ACUTE ONLY): 24 min   Charges:   PT Evaluation $PT Eval Low Complexity: 1 Low PT Treatments $Gait Training: 8-22 mins        Baxter Flattery, PT  Acute Rehab Dept ([place]) [phone] Pager [phone]  01/18/2021   [facility] 01/18/2021, 11:45 AM

## 2021-01-18 NOTE — Progress Notes (Signed)
I have reviewed and concur with students documentation.  

## 2021-01-18 NOTE — TOC Transition Note (Signed)
Transition of Care [facility]) - CM/SW Discharge Note   Patient Details  Name: Ashley Hanson MRN: [id number] Date of Birth: [birth year]  [facility] CM/SW Contact:  Lennart Pall, LCSW Phone Number: 01/18/2021, 9:31 AM   Clinical Narrative:    Met with pt and daughter this morning to review dc plans.  Pt initially referred for OPPT, however, both are requesting this be changed to HHPT as they are concerned about her limited mobility and being able to manage OP. We discussed waiting to make decision until PT has seen her, however, I have alerted Ladell Heads, RN with Dr. Damita Dunnings office who is aware.  She has gone ahead and changed plan to HHPT and will refer to [facility].  Pt has all needed DME.  No further TOC needs.   Final next level of care: [place] Barriers to Discharge: No Barriers Identified   Patient Goals and CMS Choice Patient states their goals for this hospitalization and ongoing recovery are:: return home      Discharge Placement                       Discharge Plan and Services                DME Arranged: N/A DME Agency: NA       HH Arranged: PT HH Agency: [facility] (formerly [employer])     Representative spoke with at [place]: Mila Palmer, RN with [facility] making referral today  Social Determinants of Health (SDOH) Interventions     Readmission Risk Interventions No flowsheet data found.

## 2021-01-18 NOTE — Progress Notes (Addendum)
PATIENT ID: Ashley Hanson  MRN: [id number]  DOB/AGE:  80/03/1941 / 80 y.o.  1 Day Post-Op Procedure(s) (LRB): LEFT TOTAL HIP ARTHROPLASTY ANTERIOR APPROACH (Left)    PROGRESS NOTE Subjective: Patient is alert, oriented, no Nausea, no Vomiting, yes passing gas, . Taking PO well. Denies SOB, Chest or Calf Pain. Using Incentive Spirometer, PAS in place. Ambulate WBAT Patient reports pain as  10/10. Wants more pain meds. Patient states she will not be able to physical therapy because of her pain level.  This is very similar to what happened when she had her other hip done a few years ago.  Her vital signs are normal she is not tachycardic.  Her hemoglobin this morning was over 13 and she is not even anemic on her first postop day.  I tried to explain to her in great detail that she must get out of bed to avoid pneumonia DVT and bedsores and I think she processed with I was telling her.  I am not sure she will make the effort.  Objective: Vital signs in last 24 hours: Vitals:   01/17/21 2225 01/17/21 2323 01/18/21 0146 01/18/21 0621  BP: (Abnormal) 104/54  (Abnormal) 150/58 (Abnormal) 111/56  Pulse: (Abnormal) 55  (Abnormal) 57 63  Resp: 16  17 17   Temp:   99.1 F (37.3 C) 98.7 F (37.1 C)  TempSrc:   Oral Oral  SpO2: 100%  100% 98%  Weight:  106.6 kg    Height:  5\' 5"  (1.651 m)        Intake/Output from previous day: I/O last 3 completed shifts: In: 3676 [P.O.:720; I.V.:2756; IV Piggyback:200] Out: 1000 [Urine:750; Blood:250]   Intake/Output this shift: No intake/output data recorded.   LABORATORY DATA: Recent Labs    01/18/21 0319  WBC 9.8  HGB 13.0  HCT 41.6  PLT 133*  NA 142  K 3.7  CL 105  CO2 26  BUN 11  CREATININE 0.91  GLUCOSE 133*  CALCIUM 8.6*    Examination: Neurologically intact ABD soft Neurovascular intact Sensation intact distally Intact pulses distally Dorsiflexion/Plantar flexion intact Incision: dressing C/D/I No cellulitis present Compartment  soft} XR AP&Lat of hip shows well placed\fixed THA  Assessment:   1 Day Post-Op Procedure(s) (LRB): LEFT TOTAL HIP ARTHROPLASTY ANTERIOR APPROACH (Left) ADDITIONAL DIAGNOSIS:  Expected Acute Blood Loss Anemia, dementia  Patient's anticipated LOS is less than 2 midnights, meeting these requirements: - Younger than 10 - Lives within 1 hour of care - Has a competent adult at home to recover with post-op recover - NO history of  - Chronic pain requiring opiods  - Diabetes  - Coronary Artery Disease  - Heart failure  - Heart attack  - Stroke  - DVT/VTE  - Cardiac arrhythmia  - Respiratory Failure/COPD  - Renal failure  - Anemia  - Advanced Liver disease       Plan: PT/OT WBAT, THA  DVT Prophylaxis: SCDx72 hrs, ASA 81 mg BID x 2 weeks  DISCHARGE PLAN: Home, when patient passes PT, explained need to get up and moving to patient regarding DVT, pneumonia, and bed sores.  DISCHARGE NEEDS: HHPT, Walker and 3-in-1 comode seatPatient ID: Ashley Hanson, female   DOB: Jan 03, 1941, 80 y.o.   MRN: [id number]

## 2021-01-18 NOTE — Progress Notes (Signed)
Physical Therapy Treatment Patient Details Name: Ashley Hanson MRN: [id number] DOB: 1941/09/24 Today's Date: 01/18/2021    History of Present Illness 80 yo female s/p L DA THA. PMH: s/p R DA THA, LBP, scoliosis, dementia, HTN, breast CA, OA, RA    PT Comments    Pt progressing well this pm. amb hallway distance and pain controlled. Ok to d/c with dtr assist from PT standpoint.   Follow Up Recommendations  Follow surgeon's recommendation for DC plan and follow-up therapies     Equipment Recommendations  3in1 (PT) (d/t distance from sleeping area to bathroom)    Recommendations for Other Services       Precautions / Restrictions Precautions Precautions: Fall Precaution Comments: reports multiple falls without any injuries Restrictions Other Position/Activity Restrictions: WBAT    Mobility  Bed Mobility               General bed mobility comments: in recliner, sleeps in recliner at home    Transfers Overall transfer level: Needs assistance Equipment used: Rolling walker (2 wheeled)   Sit to Stand: Supervision         General transfer comment: cues for hand placement  Ambulation/Gait Ambulation/Gait assistance: Min guard;Supervision Gait Distance (Feet): 110 Feet Assistive device: Rolling walker (2 wheeled) Gait Pattern/deviations: Step-to pattern;Trunk flexed     General Gait Details: cues for sequence, RW safety, posture (as able, pt reports trunk and hip extension exacerbate back pain at baseline)   Stairs             Wheelchair Mobility    Modified Rankin (Stroke Patients Only)       Balance                                            Cognition Arousal/Alertness: Awake/alert Behavior During Therapy: WFL for tasks assessed/performed Overall Cognitive Status: Within Functional Limits for tasks assessed                                        Exercises Total Joint Exercises Ankle Circles/Pumps:  AROM;Both;10 reps Quad Sets: AROM;Both;5 reps    General Comments        Pertinent Vitals/Pain Pain Assessment: 0-10 Pain Score: 3  Pain Location: L hip Pain Descriptors / Indicators: Grimacing;Sore Pain Intervention(s): Limited activity within patient's tolerance;Monitored during session;Premedicated before session    Home Living                      Prior Function            PT Goals (current goals can now be found in the care plan section) Acute Rehab PT Goals Patient Stated Goal: less pain PT Goal Formulation: With patient Time For Goal Achievement: 01/25/21 Potential to Achieve Goals: Good Progress towards PT goals: Progressing toward goals    Frequency    7X/week      PT Plan Current plan remains appropriate    Co-evaluation              AM-PAC PT "6 Clicks" Mobility   Outcome Measure  Help needed turning from your back to your side while in a flat bed without using bedrails?: A Little Help needed moving from lying on your back to sitting on the side of a flat  bed without using bedrails?: A Little Help needed moving to and from a bed to a chair (including a wheelchair)?: A Little Help needed standing up from a chair using your arms (e.g., wheelchair or bedside chair)?: A Little Help needed to walk in hospital room?: A Little Help needed climbing 3-5 steps with a railing? : A Little 6 Click Score: 18    End of Session Equipment Utilized During Treatment: Gait belt Activity Tolerance: Patient tolerated treatment well Patient left: in chair;with call bell/phone within reach;with family/visitor present;with chair alarm set Nurse Communication: Mobility status PT Visit Diagnosis: Other abnormalities of gait and mobility (R26.89);Difficulty in walking, not elsewhere classified (R26.2)     Time: 8416-6063 PT Time Calculation (min) (ACUTE ONLY): 20 min  Charges:  $Gait Training: 8-22 mins                     Baxter Flattery, PT  Acute Rehab Dept  ([place]) [phone] Pager [phone]  01/18/2021    [facility] 01/18/2021, 4:34 PM

## 2021-01-19 NOTE — Discharge Summary (Signed)
Patient ID: Ashley Hanson MRN: [id number] DOB/AGE: 1941/06/14 80 y.o.  Admit date: 01/17/2021 Discharge date: 01/19/2021  Admission Diagnoses:  Active Problems:   S/P total hip arthroplasty   Discharge Diagnoses:  Same  Past Medical History:  Diagnosis Date  . Anginal pain [facility])    sees [name]; reportedly negative stress/echo 2012  . Arthritis    "hands, elbows, shoulders, and back" (06/28/2018)  . Breast cancer, left breast ([place])   . Carpal tunnel syndrome    bilaterally  . Chronic lower back pain   . Complication of anesthesia    "difficutly waking up and hypothermia and stopped breathing"  . Confusion   . Dementia ([place])    "stage 4; w/both long term and short term memory loss" (06/28/2018)  . Depression   . Diverticulosis   . Esophageal reflux   . Hypertension    sees [name]  . Hypothyroidism   . IBS (irritable bowel syndrome)   . IBS (irritable bowel syndrome)   . Melanoma ([place])    2nd digit right hand; bottom of left foot" (06/28/2018)  . Migraines    "stopped having them 3-4 years ago" (06/28/2018)  . Myocardial infarction [facility])    "a mini one; early 2000s maybe"  . Osteoporosis   . PONV (postoperative nausea and vomiting)   . Refusal of blood transfusions as patient is Jehovah's Witness   . Scoliosis     Surgeries: Procedure(s): LEFT TOTAL HIP ARTHROPLASTY ANTERIOR APPROACH on 01/17/2021   Consultants:   Discharged Condition: Improved  Hospital Course: Ashley Hanson is an 80 y.o. female who was admitted 01/17/2021 for operative treatment of<principal problem not specified>. Patient has severe unremitting pain that affects sleep, daily activities, and work/hobbies. After pre-op clearance the patient was taken to the operating room on 01/17/2021 and underwent  Procedure(s): LEFT TOTAL HIP ARTHROPLASTY ANTERIOR APPROACH.    Patient was given perioperative antibiotics:  Anti-infectives (From admission, onward)   Start     Dose/Rate Route Frequency  Ordered Stop   01/17/21 0815  ceFAZolin (ANCEF) IVPB 2g/100 mL premix        2 g 200 mL/hr over 30 Minutes Intravenous On call to O.R. 01/17/21 5427 01/17/21 1009       Patient was given sequential compression devices, early ambulation, and chemoprophylaxis to prevent DVT.  Patient benefited maximally from hospital stay and there were no complications.    Recent vital signs:  Patient Vitals for the past 24 hrs:  BP Temp Temp src Pulse Resp SpO2  01/18/21 1635 (Abnormal) 148/64 98.4 F (36.9 C) Oral 62 18 97 %  01/18/21 1401 (Abnormal) 151/74 99.4 F (37.4 C) Oral 61 17 97 %  01/18/21 0950 140/60 100.2 F (37.9 C) Oral 68 17 95 %     Recent laboratory studies:  Recent Labs    01/18/21 0319  WBC 9.8  HGB 13.0  HCT 41.6  PLT 133*  NA 142  K 3.7  CL 105  CO2 26  BUN 11  CREATININE 0.91  GLUCOSE 133*  CALCIUM 8.6*     Discharge Medications:   Allergies as of 01/18/2021    Allergen Reactions Comments   Adhesive [tape] Dermatitis Severe irritation of skin   Plavix [clopidogrel Bisulfate] Itching, Other (See Comments) Causes migraines. Itching all over the body.   Warfarin And Related Other (See Comments) Blood thinners - cause migraines in patient.   Other  Refusal of blood      Medication List  Take these medications   aspirin EC 81 MG tablet Take 1 tablet (81 mg total) by mouth 2 (two) times daily. Notes to patient: Blood clot prevention   CALCIUM 1200 PO Take 1 tablet by mouth 2 (two) times daily.   docusate sodium 100 MG capsule Commonly known as: COLACE Take 100 mg by mouth 2 (two) times daily.   donepezil 10 MG tablet Commonly known as: ARICEPT Take 1 tablet (10 mg total) by mouth every morning.   gabapentin 600 MG tablet Commonly known as: NEURONTIN Take 600 mg by mouth 3 (three) times daily.   hydrochlorothiazide 12.5 MG tablet Commonly known as: HYDRODIURIL Take 12.5 mg by mouth daily.   hypromellose 0.3 % Gel ophthalmic ointment Commonly  known as: GENTEAL Place 1 application into both eyes at bedtime.   levothyroxine 50 MCG tablet Commonly known as: SYNTHROID Take 50 mcg by mouth daily before breakfast.   losartan 100 MG tablet Commonly known as: COZAAR Take 100 mg by mouth daily.   memantine 10 MG tablet Commonly known as: Namenda Take 1 tablet (10 mg total) by mouth 2 (two) times daily.   oxyCODONE 5 MG immediate release tablet Commonly known as: Roxicodone Take 1-2 tablets (5-10 mg total) by mouth every 4 (four) hours as needed for up to 7 days for severe pain.   PRESERVISION AREDS PO Take 1 capsule by mouth 2 (two) times daily.   propranolol 80 MG tablet Commonly known as: INDERAL Take 80 mg by mouth 2 (two) times daily.   rosuvastatin 20 MG tablet Commonly known as: CRESTOR Take 20 mg by mouth at bedtime.   sodium chloride 5 % ophthalmic solution Commonly known as: MURO 128 Place 1 drop into both eyes 4 (four) times daily.   tiZANidine 2 MG tablet Commonly known as: ZANAFLEX Take 1 tablet (2 mg total) by mouth every 6 (six) hours as needed. What changed: reasons to take this Notes to patient: Muscle relaxer   traMADol 50 MG tablet Commonly known as: ULTRAM Take 50 mg by mouth 2 (two) times daily as needed for severe pain.   UNABLE TO FIND Take 1 tablet by mouth 2 (two) times daily. Med Name: NerveRenew Notes to patient: Home regimen   Vitamin D-3 25 MCG (1000 UT) Caps Take 1,000 Units by mouth daily. Notes to patient: Home regimen        Discharge Care Instructions  (From admission, onward)         Start     Ordered   01/17/21 0000  Change dressing       Comments: Change dressing Only if drainage exceeds 40% of window on dressing   01/17/21 1138          Diagnostic Studies: DG Chest 2 View  Result Date: 01/15/2021 CLINICAL DATA:  Left anterior hip pain.  Preop for surgery. EXAM: CHEST - 2 VIEW COMPARISON:  11/21/2018 FINDINGS: No focal consolidation. No pleural effusion or  pneumothorax. Heart and mediastinal contours are unremarkable. Prior left axillary dissection. No acute osseous abnormality. Osteoarthritis of bilateral glenohumeral joints. IMPRESSION: No active cardiopulmonary disease. Electronically Signed   By: Kathreen Devoid   On: 01/15/2021 08:23   DG C-Arm 1-60 Min-No Report  Result Date: 01/17/2021 Fluoroscopy was utilized by the requesting physician.  No radiographic interpretation.   DG HIP OPERATIVE UNILAT W OR W/O PELVIS LEFT  Result Date: 01/17/2021 CLINICAL DATA:  LEFT hip replacement EXAM: OPERATIVE LEFT HIP (WITH PELVIS IF PERFORMED) 2 VIEWS TECHNIQUE: Fluoroscopic spot  image(s) were submitted for interpretation post-operatively. COMPARISON:  11/21/2018 FLUOROSCOPY TIME:  0 minutes 12 seconds Dose: 4.2273 mGy FINDINGS: Osseous demineralization. LEFT hip prosthesis newly identified. No fracture, dislocation, or bone destruction. Old RIGHT hip prosthesis again seen. IMPRESSION: New LEFT hip prosthesis without acute complication. Electronically Signed   By: Lavonia Dana M.D.   On: 01/17/2021 14:14    Disposition: Discharge disposition: 01-Home or Self Care       Discharge Instructions    Call MD / Call 911   Complete by: As directed    If you experience chest pain or shortness of breath, CALL 911 and be transported to the hospital emergency room.  If you develope a fever above 101 F, pus (white drainage) or increased drainage or redness at the wound, or calf pain, call your surgeon's office.   Change dressing   Complete by: As directed    Change dressing Only if drainage exceeds 40% of window on dressing   Constipation Prevention   Complete by: As directed    Drink plenty of fluids.  Prune juice may be helpful.  You may use a stool softener, such as Colace (over the counter) 100 mg twice a day.  Use MiraLax (over the counter) for constipation as needed.   Diet - low sodium heart healthy   Complete by: As directed    Increase activity slowly as  tolerated   Complete by: As directed        Follow-up Information    Frederik Pear, MD. Go on 01/27/2021.   Specialty: Orthopedic Surgery Why: Your appointment is scheduled for 9:45   Contact information: [place] [address] [phone]        [facility], [place]. Go on 01/27/2021.   Why: You will be contacted with your therapy time  Contact information: Physical Therapy [place] [address] [phone]        [facility] Follow up.   Specialty: Home Health Services Why: [place] [facility] will provide 5 HHPT prior to you starting outpatient physical therapy  Contact information: [address] [place] [address] [phone]                Signed: [name] 01/19/2021, 8:42 AM

## 2021-01-19 NOTE — Discharge Summary (Signed)
Patient ID: Ashley Hanson MRN: [id number] DOB/AGE: [birth year] 80 y.o.  Admit date: 01/17/2021 Discharge date: 01/19/2021  Admission Diagnoses:  Active Problems:   S/P total hip arthroplasty   Discharge Diagnoses:  Same  Past Medical History:  Diagnosis Date  . Anginal pain [facility])    sees [name]; reportedly negative stress/echo 2012  . Arthritis    "hands, elbows, shoulders, and back" (06/28/2018)  . Breast cancer, left breast ([place])   . Carpal tunnel syndrome    bilaterally  . Chronic lower back pain   . Complication of anesthesia    "difficutly waking up and hypothermia and stopped breathing"  . Confusion   . Dementia ([place])    "stage 4; w/both long term and short term memory loss" (06/28/2018)  . Depression   . Diverticulosis   . Esophageal reflux   . Hypertension    sees [name]  . Hypothyroidism   . IBS (irritable bowel syndrome)   . IBS (irritable bowel syndrome)   . Melanoma ([place])    2nd digit right hand; bottom of left foot" (06/28/2018)  . Migraines    "stopped having them 3-4 years ago" (06/28/2018)  . Myocardial infarction [facility])    "a mini one; early 2000s maybe"  . Osteoporosis   . PONV (postoperative nausea and vomiting)   . Refusal of blood transfusions as patient is Jehovah's Witness   . Scoliosis     Surgeries: Procedure(s): LEFT TOTAL HIP ARTHROPLASTY ANTERIOR APPROACH on 01/17/2021   Consultants:   Discharged Condition: Improved  Hospital Course: Ashley Hanson is an 80 y.o. female who was admitted 01/17/2021 for operative treatment of<principal problem not specified>. Patient has severe unremitting pain that affects sleep, daily activities, and work/hobbies. After pre-op clearance the patient was taken to the operating room on 01/17/2021 and underwent  Procedure(s): LEFT TOTAL HIP ARTHROPLASTY ANTERIOR APPROACH.    Patient was given perioperative antibiotics:  Anti-infectives (From admission, onward)   Start     Dose/Rate Route Frequency  Ordered Stop   01/17/21 0815  ceFAZolin (ANCEF) IVPB 2g/100 mL premix        2 g 200 mL/hr over 30 Minutes Intravenous On call to O.R. 01/17/21 1740 01/17/21 1009       Patient was given sequential compression devices, early ambulation, and chemoprophylaxis to prevent DVT.  Patient benefited maximally from hospital stay and there were no complications.    Recent vital signs:  Patient Vitals for the past 24 hrs:  BP Temp Temp src Pulse Resp SpO2  01/18/21 1635 (Abnormal) 148/64 98.4 F (36.9 C) Oral 62 18 97 %     Recent laboratory studies:  Recent Labs    01/18/21 0319  WBC 9.8  HGB 13.0  HCT 41.6  PLT 133*  NA 142  K 3.7  CL 105  CO2 26  BUN 11  CREATININE 0.91  GLUCOSE 133*  CALCIUM 8.6*     Discharge Medications:   Allergies as of 01/18/2021    Allergen Reactions Comments   Adhesive [tape] Dermatitis Severe irritation of skin   Plavix [clopidogrel Bisulfate] Itching, Other (See Comments) Causes migraines. Itching all over the body.   Warfarin And Related Other (See Comments) Blood thinners - cause migraines in patient.   Other  Refusal of blood      Medication List    Take these medications   aspirin EC 81 MG tablet Take 1 tablet (81 mg total) by mouth 2 (two) times daily. Notes to patient: Blood clot  prevention   CALCIUM 1200 PO Take 1 tablet by mouth 2 (two) times daily.   docusate sodium 100 MG capsule Commonly known as: COLACE Take 100 mg by mouth 2 (two) times daily.   donepezil 10 MG tablet Commonly known as: ARICEPT Take 1 tablet (10 mg total) by mouth every morning.   gabapentin 600 MG tablet Commonly known as: NEURONTIN Take 600 mg by mouth 3 (three) times daily.   hydrochlorothiazide 12.5 MG tablet Commonly known as: HYDRODIURIL Take 12.5 mg by mouth daily.   hypromellose 0.3 % Gel ophthalmic ointment Commonly known as: GENTEAL Place 1 application into both eyes at bedtime.   levothyroxine 50 MCG tablet Commonly known as:  SYNTHROID Take 50 mcg by mouth daily before breakfast.   losartan 100 MG tablet Commonly known as: COZAAR Take 100 mg by mouth daily.   memantine 10 MG tablet Commonly known as: Namenda Take 1 tablet (10 mg total) by mouth 2 (two) times daily.   oxyCODONE 5 MG immediate release tablet Commonly known as: Roxicodone Take 1-2 tablets (5-10 mg total) by mouth every 4 (four) hours as needed for up to 7 days for severe pain.   PRESERVISION AREDS PO Take 1 capsule by mouth 2 (two) times daily.   propranolol 80 MG tablet Commonly known as: INDERAL Take 80 mg by mouth 2 (two) times daily.   rosuvastatin 20 MG tablet Commonly known as: CRESTOR Take 20 mg by mouth at bedtime.   sodium chloride 5 % ophthalmic solution Commonly known as: MURO 128 Place 1 drop into both eyes 4 (four) times daily.   tiZANidine 2 MG tablet Commonly known as: ZANAFLEX Take 1 tablet (2 mg total) by mouth every 6 (six) hours as needed. What changed: reasons to take this Notes to patient: Muscle relaxer   traMADol 50 MG tablet Commonly known as: ULTRAM Take 50 mg by mouth 2 (two) times daily as needed for severe pain.   UNABLE TO FIND Take 1 tablet by mouth 2 (two) times daily. Med Name: NerveRenew Notes to patient: Home regimen   Vitamin D-3 25 MCG (1000 UT) Caps Take 1,000 Units by mouth daily. Notes to patient: Home regimen        Discharge Care Instructions  (From admission, onward)         Start     Ordered   01/17/21 0000  Change dressing       Comments: Change dressing Only if drainage exceeds 40% of window on dressing   01/17/21 1138          Diagnostic Studies: DG Chest 2 View  Result Date: 01/15/2021 CLINICAL DATA:  Left anterior hip pain.  Preop for surgery. EXAM: CHEST - 2 VIEW COMPARISON:  11/21/2018 FINDINGS: No focal consolidation. No pleural effusion or pneumothorax. Heart and mediastinal contours are unremarkable. Prior left axillary dissection. No acute osseous  abnormality. Osteoarthritis of bilateral glenohumeral joints. IMPRESSION: No active cardiopulmonary disease. Electronically Signed   By: Kathreen Devoid   On: 01/15/2021 08:23   DG C-Arm 1-60 Min-No Report  Result Date: 01/17/2021 Fluoroscopy was utilized by the requesting physician.  No radiographic interpretation.   DG HIP OPERATIVE UNILAT W OR W/O PELVIS LEFT  Result Date: 01/17/2021 CLINICAL DATA:  LEFT hip replacement EXAM: OPERATIVE LEFT HIP (WITH PELVIS IF PERFORMED) 2 VIEWS TECHNIQUE: Fluoroscopic spot image(s) were submitted for interpretation post-operatively. COMPARISON:  11/21/2018 FLUOROSCOPY TIME:  0 minutes 12 seconds Dose: 4.2273 mGy FINDINGS: Osseous demineralization. LEFT hip prosthesis newly identified.  No fracture, dislocation, or bone destruction. Old RIGHT hip prosthesis again seen. IMPRESSION: New LEFT hip prosthesis without acute complication. Electronically Signed   By: Lavonia Dana M.D.   On: 01/17/2021 14:14    Disposition: Discharge disposition: 01-Home or Self Care       Discharge Instructions    Call MD / Call 911   Complete by: As directed    If you experience chest pain or shortness of breath, CALL 911 and be transported to the hospital emergency room.  If you develope a fever above 101 F, pus (white drainage) or increased drainage or redness at the wound, or calf pain, call your surgeon's office.   Change dressing   Complete by: As directed    Change dressing Only if drainage exceeds 40% of window on dressing   Constipation Prevention   Complete by: As directed    Drink plenty of fluids.  Prune juice may be helpful.  You may use a stool softener, such as Colace (over the counter) 100 mg twice a day.  Use MiraLax (over the counter) for constipation as needed.   Diet - low sodium heart healthy   Complete by: As directed    Increase activity slowly as tolerated   Complete by: As directed        Follow-up Information    Frederik Pear, MD. Go on 01/27/2021.    Specialty: Orthopedic Surgery Why: Your appointment is scheduled for 9:45   Contact information: [place] [address] [phone]        [facility], [place]. Go on 01/27/2021.   Why: You will be contacted with your therapy time  Contact information: Physical Therapy [place] [address] [phone]        [facility] Follow up.   Specialty: Home Health Services Why: [place] [facility] will provide 5 HHPT prior to you starting outpatient physical therapy  Contact information: [address] [place] [address] [phone]                Signed: [name] 01/19/2021, 2:37 PM

## 2021-03-08 ENCOUNTER — Other Ambulatory Visit: Payer: Self-pay

## 2021-03-08 ENCOUNTER — Ambulatory Visit
Admission: RE | Admit: 2021-03-08 | Discharge: 2021-03-08 | Disposition: A | Discharge status: Home / Self Care | Payer: Medicare Other | Source: Ambulatory Visit | Attending: Internal Medicine | Admitting: Internal Medicine

## 2021-03-08 DIAGNOSIS — Z1231 Encounter for screening mammogram for malignant neoplasm of breast: Secondary | ICD-10-CM

## 2021-07-11 ENCOUNTER — Other Ambulatory Visit: Payer: Self-pay

## 2021-07-11 ENCOUNTER — Emergency Department (HOSPITAL_COMMUNITY)
Admission: EM | Admit: 2021-07-11 | Discharge: 2021-07-12 | Disposition: A | Discharge status: Home / Self Care | Payer: Medicare Other | Attending: Emergency Medicine | Admitting: Emergency Medicine

## 2021-07-11 ENCOUNTER — Encounter (HOSPITAL_COMMUNITY): Payer: Self-pay

## 2021-07-11 DIAGNOSIS — R1032 Left lower quadrant pain: Secondary | ICD-10-CM | POA: Diagnosis present

## 2021-07-11 DIAGNOSIS — Z96651 Presence of right artificial knee joint: Secondary | ICD-10-CM | POA: Insufficient documentation

## 2021-07-11 DIAGNOSIS — K5792 Diverticulitis of intestine, part unspecified, without perforation or abscess without bleeding: Secondary | ICD-10-CM

## 2021-07-11 DIAGNOSIS — K5732 Diverticulitis of large intestine without perforation or abscess without bleeding: Secondary | ICD-10-CM | POA: Insufficient documentation

## 2021-07-11 DIAGNOSIS — E039 Hypothyroidism, unspecified: Secondary | ICD-10-CM | POA: Insufficient documentation

## 2021-07-11 DIAGNOSIS — F0390 Unspecified dementia without behavioral disturbance: Secondary | ICD-10-CM | POA: Insufficient documentation

## 2021-07-11 DIAGNOSIS — K219 Gastro-esophageal reflux disease without esophagitis: Secondary | ICD-10-CM | POA: Diagnosis not present

## 2021-07-11 DIAGNOSIS — Z853 Personal history of malignant neoplasm of breast: Secondary | ICD-10-CM | POA: Insufficient documentation

## 2021-07-11 DIAGNOSIS — Z96643 Presence of artificial hip joint, bilateral: Secondary | ICD-10-CM | POA: Insufficient documentation

## 2021-07-11 LAB — URINALYSIS, ROUTINE W REFLEX MICROSCOPIC
Bilirubin Urine: NEGATIVE
Glucose, UA: NEGATIVE mg/dL
Hgb urine dipstick: NEGATIVE
Ketones, ur: NEGATIVE mg/dL
Leukocytes,Ua: NEGATIVE
Nitrite: NEGATIVE
Protein, ur: NEGATIVE mg/dL
Specific Gravity, Urine: 1.003 — ABNORMAL LOW (ref 1.005–1.030)
pH: 8 (ref 5.0–8.0)

## 2021-07-11 LAB — CBC WITH DIFFERENTIAL/PLATELET
Abs Immature Granulocytes: 0.02 10*3/uL (ref 0.00–0.07)
Basophils Absolute: 0 10*3/uL (ref 0.0–0.1)
Basophils Relative: 0 %
Eosinophils Absolute: 0.4 10*3/uL (ref 0.0–0.5)
Eosinophils Relative: 4 %
HCT: 44 % (ref 36.0–46.0)
Hemoglobin: 14.1 g/dL (ref 12.0–15.0)
Immature Granulocytes: 0 %
Lymphocytes Relative: 22 %
Lymphs Abs: 2.1 10*3/uL (ref 0.7–4.0)
MCH: 30.7 pg (ref 26.0–34.0)
MCHC: 32 g/dL (ref 30.0–36.0)
MCV: 95.7 fL (ref 80.0–100.0)
Monocytes Absolute: 0.6 10*3/uL (ref 0.1–1.0)
Monocytes Relative: 7 %
Neutro Abs: 6.2 10*3/uL (ref 1.7–7.7)
Neutrophils Relative %: 67 %
Platelets: 162 10*3/uL (ref 150–400)
RBC: 4.6 MIL/uL (ref 3.87–5.11)
RDW: 14.2 % (ref 11.5–15.5)
WBC: 9.3 10*3/uL (ref 4.0–10.5)
nRBC: 0 % (ref 0.0–0.2)

## 2021-07-11 LAB — COMPREHENSIVE METABOLIC PANEL
ALT: 16 U/L (ref 0–44)
AST: 21 U/L (ref 15–41)
Albumin: 3.4 g/dL — ABNORMAL LOW (ref 3.5–5.0)
Alkaline Phosphatase: 107 U/L (ref 38–126)
Anion gap: 9 (ref 5–15)
BUN: 9 mg/dL (ref 8–23)
CO2: 26 mmol/L (ref 22–32)
Calcium: 9.6 mg/dL (ref 8.9–10.3)
Chloride: 105 mmol/L (ref 98–111)
Creatinine, Ser: 0.78 mg/dL (ref 0.44–1.00)
GFR, Estimated: >60 mL/min (ref >60)
Glucose, Bld: 102 mg/dL — ABNORMAL HIGH (ref 70–99)
Potassium: 3.3 mmol/L — ABNORMAL LOW (ref 3.5–5.1)
Sodium: 140 mmol/L (ref 135–145)
Total Bilirubin: 0.8 mg/dL (ref 0.3–1.2)
Total Protein: 7.5 g/dL (ref 6.5–8.1)

## 2021-07-11 LAB — LIPASE, BLOOD: Lipase: 28 U/L (ref 11–51)

## 2021-07-11 LAB — TROPONIN I (HIGH SENSITIVITY)
Troponin I (High Sensitivity): 17 ng/L (ref <18)
Troponin I (High Sensitivity): 17 ng/L (ref <18)

## 2021-07-11 NOTE — ED Triage Notes (Signed)
Patient complains of left side pain radiating around to lower abdomen x 2 weeks and describes as intermittent. Nausea with no vomiting and reports some diarrhea. Patient denies fever, no chills.

## 2021-07-11 NOTE — ED Provider Notes (Signed)
Emergency Medicine Provider Triage Evaluation Note  Mekiah Hurley , a 80 y.o. female  was evaluated in triage.  Pt complains of abdominal pain.  She reports 2 weeks of left-sided pain radiating into her lower abdomen.  The pain is intermittent.  She reports nausea without vomiting and does report occasional diarrhea.  Frequent urination however no dysuria..  Review of Systems  Positive: Abdominal pain Negative: Fevers, vomiting  Physical Exam  BP (!) 191/86 (BP Location: Left Arm)   Pulse 62   Temp 98.8 F (37.1 C)   Resp 15   SpO2 99%  Gen:   Awake, no distress   Resp:  Normal effort  MSK:   Moves extremities without difficulty  Other:  Patient is awake and alert, in no obvious distress.  Medical Decision Making  Medically screening exam initiated at 4:10 PM.  Appropriate orders placed.  Beatriz Molloy was informed that the remainder of the evaluation will be completed by another provider, this initial triage assessment does not replace that evaluation, and the importance of remaining in the ED until their evaluation is complete.  Note: Portions of this report may have been transcribed using voice recognition software. Every effort was made to ensure accuracy; however, inadvertent computerized transcription errors may be present    Ollen Gross 07/11/21 1610    Tegeler, Gwenyth Allegra, MD 07/11/21 [phone]

## 2021-07-12 ENCOUNTER — Emergency Department (HOSPITAL_COMMUNITY): Payer: Medicare Other

## 2021-07-12 DIAGNOSIS — K5732 Diverticulitis of large intestine without perforation or abscess without bleeding: Secondary | ICD-10-CM | POA: Diagnosis not present

## 2021-07-12 MED ORDER — AMOXICILLIN-POT CLAVULANATE 875-125 MG PO TABS
1.0000 | ORAL_TABLET | Freq: Once | ORAL | Status: AC
  Administered 2021-07-12: 1 via ORAL
  Filled 2021-07-12: qty 1

## 2021-07-12 MED ORDER — AMOXICILLIN-POT CLAVULANATE 875-125 MG PO TABS: 1.0000 | ORAL_TABLET | Freq: Two times a day (BID) | ORAL | 0 refills | Status: DC

## 2021-07-12 MED ORDER — AMOXICILLIN-POT CLAVULANATE 875-125 MG PO TABS: 1.0000 | ORAL_TABLET | Freq: Two times a day (BID) | ORAL | 0 refills | Status: AC

## 2021-07-12 NOTE — ED Provider Notes (Signed)
[facility] Emergency Department Provider Note MRN:  [id number]  Arrival date & time: 07/12/21     Chief Complaint   Abdominal pain History of Present Illness   Ashley Hanson is a 80 y.o. year-old female with a history of dementia, breast cancer, IBS presenting to the ED with chief complaint of abdominal pain.  Location: Left lower quadrant and left flank Duration: Couple weeks Onset: Gradual Timing: Constant Description: Dull ache Severity: Moderate Exacerbating/Alleviating Factors: None Associated Symptoms: Some diarrhea recently Pertinent Negatives: Denies fever, no chest pain or shortness of breath, no dysuria, no hematuria   Review of Systems  A complete 10 system review of systems was obtained and all systems are negative except as noted in the HPI and PMH.   Patient's Health History    Past Medical History:  Diagnosis Date   Anginal pain [facility])    sees [name]; reportedly negative stress/echo 2012   Arthritis    "hands, elbows, shoulders, and back" (06/28/2018)   Breast cancer, left breast (HCC)    Carpal tunnel syndrome    bilaterally   Chronic lower back pain    Complication of anesthesia    "difficutly waking up and hypothermia and stopped breathing"   Confusion    Dementia ([place])    "stage 4; w/both long term and short term memory loss" (06/28/2018)   Depression    Diverticulosis    Esophageal reflux    Hypertension    sees [name]   Hypothyroidism    IBS (irritable bowel syndrome)    IBS (irritable bowel syndrome)    Melanoma ([place])    2nd digit right hand; bottom of left foot" (06/28/2018)   Migraines    "stopped having them 3-4 years ago" (06/28/2018)   Myocardial infarction [facility])    "a mini one; early 2000s maybe"   Osteoporosis    PONV (postoperative nausea and vomiting)    Refusal of blood transfusions as patient is Jehovah's Witness    Scoliosis     Past Surgical History:  Procedure Laterality Date   ANKLE  SURGERY Left 10/2007   "fell at work; don't know what they did to it"   BREAST BIOPSY Left "X 2"   BREAST LUMPECTOMY Left    BREAST SURGERY Left    "removed scar tissue"   CARDIAC CATHETERIZATION  X 2   CARDIOVASCULAR STRESS TEST     CARPAL TUNNEL RELEASE Right 1995   CARPAL TUNNEL RELEASE Left    CATARACT EXTRACTION W/ INTRAOCULAR LENS  IMPLANT, BILATERAL     CHOLECYSTECTOMY  10/04/2012   Procedure: LAPAROSCOPIC CHOLECYSTECTOMY WITH INTRAOPERATIVE CHOLANGIOGRAM;  Surgeon: Adin Hector, MD;  Location: [place];  Service: General;  Laterality: N/A;  laparosopic cholecystectomy with cholangiogram and repair of umbilical hernia   COLONOSCOPY  08/30/11   DILATION AND CURETTAGE OF UTERUS     DOPPLER ECHOCARDIOGRAPHY     hx    ERCP  10/05/2012   Procedure: ENDOSCOPIC RETROGRADE CHOLANGIOPANCREATOGRAPHY (ERCP);  Surgeon: Jeryl Columbia, MD;  Location: [facility] ENDOSCOPY;  Service: Endoscopy;  Laterality: N/A;   ESOPHAGOGASTRODUODENOSCOPY  08/30/11   HERNIA REPAIR  10/02/2014   "in my stomach; took it out w/gallbladder"   JOINT REPLACEMENT     KELOID EXCISION Bilateral    "earlobes"   KNEE ARTHROSCOPY Right 06/2007   MELANOMA EXCISION Bilateral    2nd digit right hand; bottom of left foot" (06/28/2018)   SHOULDER ARTHROSCOPY Right 06/2011   SKIN GRAFT Right    "  related to melanoma restricting my finger bending"   TOTAL HIP ARTHROPLASTY Right 06/28/2018   TOTAL HIP ARTHROPLASTY Right 06/28/2018   Procedure: RIGHT TOTAL HIP ARTHROPLASTY ANTERIOR APPROACH;  Surgeon: Frederik Pear, MD;  Location: [place];  Service: Orthopedics;  Laterality: Right;   TOTAL HIP ARTHROPLASTY Left 01/17/2021   Procedure: LEFT TOTAL HIP ARTHROPLASTY ANTERIOR APPROACH;  Surgeon: Frederik Pear, MD;  Location: [facility];  Service: Orthopedics;  Laterality: Left;   TOTAL KNEE ARTHROPLASTY Right 08/2008   TUBAL LIGATION      Family History  Problem Relation Age of Onset   Colon polyps Mother    Breast cancer Sister    Dementia  Sister    Sleep apnea Daughter     Social History   Socioeconomic History   Marital status: Widowed    Spouse name: Not on file   Number of children: 2   Years of education: 19   Highest education level: Not on file  Occupational History   Occupation: Sales    Comment: Retired  Tobacco Use   Smoking status: Never   Smokeless tobacco: Never  Vaping Use   Vaping Use: Never used  Substance and Sexual Activity   Alcohol use: Not Currently   Drug use: Never   Sexual activity: Not Currently  Other Topics Concern   Not on file  Social History Narrative   Patient lives at home with her father and he is blind. Patient is retired. Patient is widowed and has two children.   Right handed.   Caffeine- None   Social Determinants of Health   Financial Resource Strain: Not on file  Food Insecurity: Not on file  Transportation Needs: Not on file  Physical Activity: Not on file  Stress: Not on file  Social Connections: Not on file  Intimate Partner Violence: Not on file     Physical Exam   Vitals:   07/12/21 0245 07/12/21 0253  BP: (!) 155/58   Pulse: (!) 56   Resp: 16   Temp:  98.3 F (36.8 C)  SpO2: 97%     CONSTITUTIONAL: Well-appearing, NAD NEURO:  Alert and oriented x 3, no focal deficits EYES:  eyes equal and reactive ENT/NECK:  no LAD, no JVD CARDIO: Regular rate, well-perfused, normal S1 and S2 PULM:  CTAB no wheezing or rhonchi GI/GU:  normal bowel sounds, non-distended, mild left lower quadrant tenderness to palpation MSK/SPINE:  No gross deformities, no edema SKIN:  no rash, atraumatic PSYCH:  Appropriate speech and behavior  *Additional and/or pertinent findings included in MDM below  Diagnostic and Interventional Summary    EKG Interpretation  Date/Time:  Monday July 11 2021 16:11:20 EDT Ventricular Rate:  60 PR Interval:  180 QRS Duration: 86 QT Interval:  448 QTC Calculation: 448 R Axis:   69 Text Interpretation: Normal sinus rhythm  Nonspecific ST abnormality Abnormal ECG Confirmed by Gerlene Fee [phone]) on 07/12/2021 12:46:02 AM       Labs Reviewed  COMPREHENSIVE METABOLIC PANEL - Abnormal; Notable for the following components:      Result Value   Potassium 3.3 (*)    Glucose, Bld 102 (*)    Albumin 3.4 (*)    All other components within normal limits  URINALYSIS, ROUTINE W REFLEX MICROSCOPIC - Abnormal; Notable for the following components:   Color, Urine STRAW (*)    Specific Gravity, Urine 1.003 (*)    All other components within normal limits  CBC WITH DIFFERENTIAL/PLATELET  LIPASE, BLOOD  TROPONIN I (  HIGH SENSITIVITY)  TROPONIN I (HIGH SENSITIVITY)    CT ABDOMEN PELVIS WO CONTRAST  Final Result      Medications  amoxicillin-clavulanate (AUGMENTIN) 875-125 MG per tablet 1 tablet (has no administration in time range)     Procedures  /  Critical Care Procedures  ED Course and Medical Decision Making  I have reviewed the triage vital signs, the nursing notes, and pertinent available records from the EMR.  Listed above are laboratory and imaging tests that I personally ordered, reviewed, and interpreted and then considered in my medical decision making (see below for details).  Differential diagnosis includes diverticulitis, kidney stone, malignancy.  CT is pending, labs thus far reassuring.     CT consistent with diverticulitis, no complicating features, patient continues to look and feel well, normal vital signs, no systemic signs of illness, appropriate for discharge.  Barth Kirks. Sedonia Small, [place] [email or web address]  Final Clinical Impressions(s) / ED Diagnoses     ICD-10-CM   1. Diverticulitis  K57.92       ED Discharge Orders          Ordered    amoxicillin-clavulanate (AUGMENTIN) 875-125 MG tablet  Every 12 hours        07/12/21 0254             Discharge Instructions Discussed with and Provided to Patient:      Discharge Instructions      You were evaluated in the Emergency Department and after careful evaluation, we did not find any emergent condition requiring admission or further testing in the hospital.  Your exam/testing today was overall reassuring.  Symptoms seem to be due to diverticulitis.  Please take the Augmentin antibiotic as directed.  Use Tylenol or Motrin for discomfort.  Please return to the Emergency Department if you experience any worsening of your condition.  Thank you for allowing [place] to be a part of your care.         Maudie Flakes, MD 07/12/21 [phone]

## 2021-07-12 NOTE — Discharge Instructions (Addendum)
You were evaluated in the Emergency Department and after careful evaluation, we did not find any emergent condition requiring admission or further testing in the hospital.  Your exam/testing today was overall reassuring.  Symptoms seem to be due to diverticulitis.  Please take the Augmentin antibiotic as directed.  Use Tylenol or Motrin for discomfort.  Please return to the Emergency Department if you experience any worsening of your condition.  Thank you for allowing [place] to be a part of your care.

## 2022-06-02 ENCOUNTER — Other Ambulatory Visit: Payer: Self-pay | Admitting: Internal Medicine

## 2022-06-02 DIAGNOSIS — Z1231 Encounter for screening mammogram for malignant neoplasm of breast: Secondary | ICD-10-CM

## 2022-06-27 ENCOUNTER — Ambulatory Visit
Admission: RE | Admit: 2022-06-27 | Discharge: 2022-06-27 | Disposition: A | Discharge status: Home / Self Care | Payer: Medicare Other | Source: Ambulatory Visit | Attending: Internal Medicine | Admitting: Internal Medicine

## 2022-06-27 DIAGNOSIS — Z1231 Encounter for screening mammogram for malignant neoplasm of breast: Secondary | ICD-10-CM

## 2022-09-22 ENCOUNTER — Telehealth: Payer: Self-pay

## 2022-09-22 NOTE — Telephone Encounter (Signed)
1st attempt to reach pt to schedule IN OFFICE visit for preop clearance. No answer. Lvm

## 2022-09-22 NOTE — Telephone Encounter (Signed)
   Pre-operative Risk Assessment    Patient Name: Ashley Hanson  DOB: [birth year] MRN: [id number]     Request for Surgical Clearance    Procedure:   RT REVERSE SHOULDER ARTHROPLASTY  Date of Surgery:  Clearance TBD                                 Surgeon:  DR Lennette Bihari SUPPLE Surgeon's Group or Practice Name:  [facility] Phone number:  [phone] Fax number:  [phone]   Type of Clearance Requested:   - Pharmacy:  Hold Aspirin NEED ASA INSTRUCTIONS   Type of Anesthesia:  General   Additional requests/questions:  Please fax a copy of SURGICAL CLEARANCE to the surgeon's office.  Signed, Jeanmarie Plant [name]  CCMA 09/22/2022, 3:51 PM

## 2022-09-22 NOTE — Telephone Encounter (Signed)
   Name: Ashley Hanson  DOB: 26-Dec-1940  MRN: [id number]  Primary Cardiologist: Sanda Klein, MD  Chart reviewed as part of pre-operative protocol coverage. Because of Ashley Hanson's past medical history and time since last visit, she will require a follow-up in-office visit in order to better assess preoperative cardiovascular risk.  Pre-op covering staff: - Please schedule appointment and call patient to inform them. If patient already had an upcoming appointment within acceptable timeframe, please add "pre-op clearance" to the appointment notes so provider is aware. - Please contact requesting surgeon's office via preferred method (i.e, phone, fax) to inform them of need for appointment prior to surgery.   Lenna Sciara, NP  09/22/2022, 4:01 PM

## 2022-09-25 NOTE — Telephone Encounter (Signed)
Contacted the patient and scheduled pre-op appointment. Patient agreeable and voiced understanding.

## 2022-09-28 NOTE — Progress Notes (Signed)
Cardiology Clinic Note   Patient Name: Ashley Hanson Date of Encounter: 09/29/2022  Primary Care Provider:  Michael Boston, MD Primary Cardiologist:  Sanda Klein, MD  Patient Profile    This is an 81 year old female patient with history of hypertension, TIA, dementia, normal coronary arteries per cardiac cath in 2005, follow-up nuclear medicine stress test in 2019 was nonischemic.  Patient had carotid artery Doppler studies revealing mild nonobstructive disease bilaterally in the setting of questionable TIA.  She also has lymphedema and is followed by physical therapy for this.  She has frequent falls, with neuropathy which is improved with toe caps.  She has had left hip replacement and was seen last by cardiology for clearance.   Past Medical History    Past Medical History:  Diagnosis Date   Anginal Hanson [facility])    sees [name]; reportedly negative stress/echo 2012   Arthritis    "hands, elbows, shoulders, and back" (06/28/2018)   Breast cancer, left breast (HCC)    Carpal tunnel syndrome    bilaterally   Chronic lower back Hanson    Complication of anesthesia    "difficutly waking up and hypothermia and stopped breathing"   Confusion    Dementia ([place])    "stage 4; w/both long term and short term memory loss" (06/28/2018)   Depression    Diverticulosis    Esophageal reflux    Hypertension    sees [name]   Hypothyroidism    IBS (irritable bowel syndrome)    IBS (irritable bowel syndrome)    Melanoma ([place])    2nd digit right hand; bottom of left foot" (06/28/2018)   Migraines    "stopped having them 3-4 years ago" (06/28/2018)   Myocardial infarction [facility])    "a mini one; early 2000s maybe"   Osteoporosis    PONV (postoperative nausea and vomiting)    Refusal of blood transfusions as patient is Jehovah's Witness    Scoliosis    Past Surgical History:  Procedure Laterality Date   ANKLE SURGERY Left 10/2007   "fell at work; don't know what they did to it"    BREAST BIOPSY Left "X 2"   BREAST LUMPECTOMY Left    BREAST SURGERY Left    "removed scar tissue"   CARDIAC CATHETERIZATION  X 2   CARDIOVASCULAR STRESS TEST     CARPAL TUNNEL RELEASE Right 1995   CARPAL TUNNEL RELEASE Left    CATARACT EXTRACTION W/ INTRAOCULAR LENS  IMPLANT, BILATERAL     CHOLECYSTECTOMY  10/04/2012   Procedure: LAPAROSCOPIC CHOLECYSTECTOMY WITH INTRAOPERATIVE CHOLANGIOGRAM;  Surgeon: Adin Hector, MD;  Location: [place];  Service: General;  Laterality: N/A;  laparosopic cholecystectomy with cholangiogram and repair of umbilical hernia   COLONOSCOPY  08/30/11   DILATION AND CURETTAGE OF UTERUS     DOPPLER ECHOCARDIOGRAPHY     hx    ERCP  10/05/2012   Procedure: ENDOSCOPIC RETROGRADE CHOLANGIOPANCREATOGRAPHY (ERCP);  Surgeon: Jeryl Columbia, MD;  Location: [facility] ENDOSCOPY;  Service: Endoscopy;  Laterality: N/A;   ESOPHAGOGASTRODUODENOSCOPY  08/30/11   HERNIA REPAIR  10/02/2014   "in my stomach; took it out w/gallbladder"   JOINT REPLACEMENT     KELOID EXCISION Bilateral    "earlobes"   KNEE ARTHROSCOPY Right 06/2007   MELANOMA EXCISION Bilateral    2nd digit right hand; bottom of left foot" (06/28/2018)   SHOULDER ARTHROSCOPY Right 06/2011   SKIN GRAFT Right    "related to melanoma restricting my finger bending"  TOTAL HIP ARTHROPLASTY Right 06/28/2018   TOTAL HIP ARTHROPLASTY Right 06/28/2018   Procedure: RIGHT TOTAL HIP ARTHROPLASTY ANTERIOR APPROACH;  Surgeon: Frederik Pear, MD;  Location: [place];  Service: Orthopedics;  Laterality: Right;   TOTAL HIP ARTHROPLASTY Left 01/17/2021   Procedure: LEFT TOTAL HIP ARTHROPLASTY ANTERIOR APPROACH;  Surgeon: Frederik Pear, MD;  Location: [facility];  Service: Orthopedics;  Laterality: Left;   TOTAL KNEE ARTHROPLASTY Right 08/2008   TUBAL LIGATION      Allergies  Allergies  Allergen Reactions   Adhesive [Tape] Dermatitis    Severe irritation of skin   Plavix [Clopidogrel Bisulfate] Itching and Other (See Comments)    Causes  migraines. Itching all over the body.   Warfarin And Related Other (See Comments)    Blood thinners - cause migraines in patient.   Other     Refusal of blood    History of Present Illness    [name] is an 81 year old female patient who is here for preoperative evaluation to have right reverse shoulder arthroplasty by Dr. Justice Britain with [facility] on date to be determined. They request recommendations concerning holding aspirin as well.  She is a very pleasant lady currently sitting in wheelchair with leg braces bilaterally due to lymphedema and wrapping.  She denies any rapid heart rhythm, shortness of breath, dizziness, or chest discomfort.  She is not very active using a rolling walker for ambulation but mostly sitting unless she needs to get up and use the bathroom or move into the kitchen for meals.  She is medically compliant.  Home Medications    Current Outpatient Medications  Medication Sig Dispense Refill   aspirin EC 81 MG tablet Take 1 tablet (81 mg total) by mouth 2 (two) times daily. 60 tablet 0   Calcium Carbonate-Vit D-Min (CALCIUM 1200 PO) Take 1 tablet by mouth 2 (two) times daily.     Cholecalciferol (VITAMIN D-3) 1000 units CAPS Take 1,000 Units by mouth daily.     docusate sodium (COLACE) 100 MG capsule Take 100 mg by mouth 2 (two) times daily.     donepezil (ARICEPT) 10 MG tablet Take 1 tablet (10 mg total) by mouth every morning. 90 tablet 3   gabapentin (NEURONTIN) 600 MG tablet Take 600 mg by mouth 3 (three) times daily.     hydrochlorothiazide (HYDRODIURIL) 12.5 MG tablet Take 12.5 mg by mouth daily.     HYDROcodone-acetaminophen (NORCO/VICODIN) 5-325 MG tablet Take 1 tablet by mouth every 6 (six) hours as needed.     hypromellose (GENTEAL) 0.3 % GEL ophthalmic ointment Place 1 application into both eyes at bedtime.     levothyroxine (SYNTHROID, LEVOTHROID) 50 MCG tablet Take 50 mcg by mouth daily before breakfast.     losartan (COZAAR) 100 MG tablet Take  100 mg by mouth daily.      memantine (NAMENDA) 10 MG tablet Take 1 tablet (10 mg total) by mouth 2 (two) times daily. 180 tablet 3   Multiple Vitamins-Minerals (PRESERVISION AREDS PO) Take 1 capsule by mouth 2 (two) times daily.      propranolol (INDERAL) 80 MG tablet Take 80 mg by mouth 2 (two) times daily.     rosuvastatin (CRESTOR) 20 MG tablet Take 20 mg by mouth at bedtime.     sodium chloride (MURO 128) 5 % ophthalmic solution Place 1 drop into both eyes 4 (four) times daily.      tiZANidine (ZANAFLEX) 2 MG tablet Take 1 tablet (2 mg total) by mouth every  6 (six) hours as needed. (Patient taking differently: Take 2 mg by mouth every 6 (six) hours as needed for muscle spasms.) 60 tablet 0   traMADol (ULTRAM) 50 MG tablet Take 50 mg by mouth 2 (two) times daily as needed for severe Hanson.     UNABLE TO FIND Take 1 tablet by mouth 2 (two) times daily. Med Name: NerveRenew     No current facility-administered medications for this visit.     Family History    Family History  Problem Relation Age of Onset   Colon polyps Mother    Breast cancer Sister    Dementia Sister    Sleep apnea Daughter    She indicated that her mother is deceased. She indicated that her father is deceased. She indicated that her sister is alive. She indicated that the status of her daughter is unknown.  Social History    Social History   Socioeconomic History   Marital status: Widowed    Spouse name: Not on file   Number of children: 2   Years of education: 88   Highest education level: Not on file  Occupational History   Occupation: Sales    Comment: Retired  Tobacco Use   Smoking status: Never   Smokeless tobacco: Never  Vaping Use   Vaping Use: Never used  Substance and Sexual Activity   Alcohol use: Not Currently   Drug use: Never   Sexual activity: Not Currently  Other Topics Concern   Not on file  Social History Narrative   Patient lives at home with her father and he is blind. Patient is  retired. Patient is widowed and has two children.   Right handed.   Caffeine- None   Social Determinants of Health   Financial Resource Strain: Not on file  Food Insecurity: Not on file  Transportation Needs: Not on file  Physical Activity: Not on file  Stress: Not on file  Social Connections: Not on file  Intimate Partner Violence: Not on file     Review of Systems    General:  No chills, fever, night sweats or weight changes.  Cardiovascular:  No chest Hanson, dyspnea on exertion, edema, orthopnea, palpitations, paroxysmal nocturnal dyspnea. Dermatological: No rash, lesions/masses Respiratory: No cough, dyspnea Urologic: No hematuria, dysuria Abdominal:   No nausea, vomiting, diarrhea, bright red blood per rectum, melena, or hematemesis Neurologic:  No visual changes, wkns, changes in mental status. All other systems reviewed and are otherwise negative except as noted above.     Physical Exam    VS:  BP 138/70 (BP Location: Left Arm, Patient Position: Sitting, Cuff Size: Large)   Pulse (!) 55   Ht '5\' 6"'$  (1.676 m)   Wt 228 lb (103.4 kg)   SpO2 95%   BMI 36.80 kg/m  , BMI Body mass index is 36.8 kg/m.     GEN: Well nourished, well developed, in no acute distress.  Sitting in a wheelchair HEENT: normal. Neck: Supple, no JVD, carotid bruits, or masses. Cardiac: RRR, soft systolic  murmurs, rubs, or gallops. No clubbing, cyanosis, edema.  Radials/DP/PT 2+ and equal bilaterally.  Respiratory:  Respirations regular and unlabored, clear to auscultation bilaterally. GI: Soft, nontender, nondistended, BS + x 4. MS: no deformity or atrophy.  Bilateral leg wraps above the knees with bilateral leg braces. Skin: warm and dry, no rash. Neuro:  Strength and sensation are intact. Psych: Normal affect.  Accessory Clinical Findings    ECG personally reviewed by me today-sinus  bradycardia heart rate 55 bpm, LVH is noted with motion artifact.- No acute changes  Lab Results   Component Value Date   WBC 9.3 07/11/2021   HGB 14.1 07/11/2021   HCT 44.0 07/11/2021   MCV 95.7 07/11/2021   PLT 162 07/11/2021   Lab Results  Component Value Date   CREATININE 0.78 07/11/2021   BUN 9 07/11/2021   NA 140 07/11/2021   K 3.3 (L) 07/11/2021   CL 105 07/11/2021   CO2 26 07/11/2021   Lab Results  Component Value Date   ALT 16 07/11/2021   AST 21 07/11/2021   ALKPHOS 107 07/11/2021   BILITOT 0.8 07/11/2021   No results found for: "CHOL", "HDL", "LDLCALC", "LDLDIRECT", "TRIG", "CHOLHDL"  No results found for: "HGBA1C"  Review of Prior Studies: Echo 2020: 1. The left ventricle has normal systolic function of 76-72%. The cavity  size was normal. There is no increased left ventricular wall thickness.  Echo evidence of normal diastolic relaxation.   2. The right ventricle has normal systolic function. The cavity was  normal. There is no increase in right ventricular wall thickness. Right  ventricular systolic pressure could not be assessed.   3. Left atrial size was moderately dilated.   4. The mitral valve is normal in structure. There is mild mitral annular  calcification present.   5. The tricuspid valve is normal in structure.   6. The aortic valve is normal in structure.   7. The pulmonic valve was normal in structure.      Nuclear stress test 2019: Nuclear stress EF: 45%. The left ventricular ejection fraction is mildly decreased (45-54%). There was no ST segment deviation noted during stress. The study is normal. This is a low risk study.   Low risk stress nuclear study with normal perfusion and mildly reduced left ventricular global systolic function. Compared to 2014, perfusion pattern has normalized, but LVEF has decreased. Possible gating artifact, suggest correlation with echo.    Assessment & Plan   Chart reviewed as part of pre-operative protocol coverage. Given past medical history and time since last visit, based on ACC/AHA guidelines,  Ashley Hanson would be at acceptable risk for the planned procedure without further cardiovascular testing.  Unable to determine METS as the patient is very sedentary using a walker for ambulation for brief periods of time today due to significant lymphedema.  The patient may hold aspirin 5 to 7 days prior to planned surgery.  Her last dose was on 09/28/2022.  Preoperative labs per surgery  2.  Hypertension: Currently well controlled on medication regimen despite having right shoulder Hanson.  Would continue current regimen without holding for surgery this includes HCTZ, losartan, propanolol twice daily..  3.  Chronic lymphedema: Bilaterally followed by physical therapy, but now using home wraps and massage therapy with considerable improvement in her edema.  She still is very inactive using a walker for ambulation.  On low-dose HCTZ daily for both blood pressure control and fluid retention.  I will route this recommendation to the requesting party via Epic fax function and remove from pre-op pool.  Please call with questions.  Phill Myron. Malachai Schalk DNP, ANP, AACC  09/29/2022, 2:29 PM       Current medicines are reviewed at length with the patient today.  I have spent 25 min's  dedicated to the care of this patient on the date of this encounter to include pre-visit review of records, assessment, management and diagnostic testing,with shared decision making. Signed, Curt Bears  Brooks Sailors DNP, ANP, AACC   09/29/2022 2:28 PM      Office [phone] Fax [phone]  Notice: This dictation was prepared with Dragon dictation along with smaller phrase technology. Any transcriptional errors that result from this process are unintentional and may not be corrected upon review.

## 2022-09-29 ENCOUNTER — Encounter: Payer: Self-pay | Admitting: Adult Health

## 2022-09-29 ENCOUNTER — Ambulatory Visit: Payer: Medicare Other | Attending: Adult Health | Admitting: Adult Health

## 2022-09-29 VITALS — BP 138/70 | HR 55 | Ht 66.0 in | Wt 228.0 lb

## 2022-09-29 DIAGNOSIS — Z01818 Encounter for other preprocedural examination: Secondary | ICD-10-CM | POA: Diagnosis not present

## 2022-09-29 DIAGNOSIS — I10 Essential (primary) hypertension: Secondary | ICD-10-CM | POA: Insufficient documentation

## 2022-09-29 DIAGNOSIS — I890 Lymphedema, not elsewhere classified: Secondary | ICD-10-CM | POA: Insufficient documentation

## 2022-09-29 NOTE — Patient Instructions (Signed)
Medication Instructions:  No Changes *If you need a refill on your cardiac medications before your next appointment, please call your pharmacy*   Lab Work: No Labs If you have labs (blood work) drawn today and your tests are completely normal, you will receive your results only by: [place] (if you have MyChart) OR A paper copy in the mail If you have any lab test that is abnormal or we need to change your treatment, we will call you to review the results.   Testing/Procedures: No Testing   Follow-Up: At [facility], you and your health needs are our priority.  As part of our continuing mission to provide you with exceptional heart care, we have created designated Provider Care Teams.  These Care Teams include your primary Cardiologist (physician) and Advanced Practice Providers (APPs -  Physician Assistants and Nurse Practitioners) who all work together to provide you with the care you need, when you need it.  We recommend signing up for the patient portal called "MyChart".  Sign up information is provided on this After Visit Summary.  MyChart is used to connect with patients for Virtual Visits (Telemedicine).  Patients are able to view lab/test results, encounter notes, upcoming appointments, etc.  Non-urgent messages can be sent to your provider as well.   To learn more about what you can do with MyChart, go to [email or web address].    Your next appointment:   6 month(s)  The format for your next appointment:   In Person  Provider:   Sanda Klein, MD

## 2022-10-06 NOTE — Patient Instructions (Addendum)
SURGICAL WAITING ROOM VISITATION Patients having surgery or a procedure may have no more than 2 support people in the waiting area - these visitors may rotate.   Children under the age of 79 must have an adult with them who is not the patient. If the patient needs to stay at the hospital during part of their recovery, the visitor guidelines for inpatient rooms apply. Pre-op nurse will coordinate an appropriate time for 1 support person to accompany patient in pre-op.  This support person may not rotate.    Please refer to the [facility] website for the visitor guidelines for Inpatients (after your surgery is over and you are in a regular room).      Your procedure is scheduled on: 10-26-22   Report to [facility] Main Entrance    Report to admitting at 12:30 PM   Call this number if you have problems the morning of surgery [phone]   Do not eat food :After Midnight.   After Midnight you may have the following liquids until 12:00 PM DAY OF SURGERY  Water Non-Citrus Juices (without pulp, NO RED) Carbonated Beverages Black Coffee (NO MILK/CREAM OR CREAMERS, sugar ok)  Clear Tea (NO MILK/CREAM OR CREAMERS, sugar ok) regular and decaf                             Plain Jell-O (NO RED)                                           Fruit ices (not with fruit pulp, NO RED)                                     Popsicles (NO RED)                                                               Sports drinks like Gatorade (NO RED)                  The day of surgery:  Drink ONE (1) Pre-Surgery Clear Ensure at 12:00 PM the morning of surgery. Drink in one sitting. Do not sip.  This drink was given to you during your hospital  pre-op appointment visit. Nothing else to drink after completing the Pre-Surgery Clear Ensure .          If you have questions, please contact your surgeon's office.   FOLLOW  ANY ADDITIONAL PRE OP INSTRUCTIONS YOU RECEIVED FROM YOUR SURGEON'S OFFICE!!!      Oral Hygiene is also important to reduce your risk of infection.                                    Remember - BRUSH YOUR TEETH THE MORNING OF SURGERY WITH YOUR REGULAR TOOTHPASTE   Do NOT smoke after Midnight   Take these medicines the morning of surgery with A SIP OF WATER:   Donepezil  Gabapentin  Levothyroxine  Memantin  Propranolol  Hydrocodone if needed  Tramadol if needed  DO NOT TAKE ANY ORAL DIABETIC MEDICATIONS DAY OF YOUR SURGERY  Bring CPAP mask and tubing day of surgery.                              You may not have any metal on your body including hair pins, jewelry, and body piercing             Do not wear make-up, lotions, powders, perfumes or deodorant  Do not wear nail polish including gel and S&S, artificial/acrylic nails, or any other type of covering on natural nails including finger and toenails. If you have artificial nails, gel coating, etc. that needs to be removed by a nail salon please have this removed prior to surgery or surgery may need to be canceled/ delayed if the surgeon/ anesthesia feels like they are unable to be safely monitored.   Do not shave  48 hours prior to surgery.    Do not bring valuables to the hospital. [place].   Contacts, dentures or bridgework may not be worn into surgery.  DO NOT [place]. PHARMACY WILL DISPENSE MEDICATIONS LISTED ON YOUR MEDICATION LIST TO YOU DURING YOUR ADMISSION [place]!    Patients discharged on the day of surgery will not be allowed to drive home.  Someone NEEDS to stay with you for the first 24 hours after anesthesia.   Special Instructions: Bring a copy of your healthcare power of attorney and living will documents the day of surgery if you haven't scanned them before.              Please read over the following fact sheets you were given: IF [place]  [place]  If you received a COVID test during your pre-op visit  it is requested that you wear a mask when out in public, stay away from anyone that may not be feeling well and notify your surgeon if you develop symptoms. If you test positive for Covid or have been in contact with anyone that has tested positive in the last 10 days please notify you surgeon.  [place]- Preparing for Total Shoulder Arthroplasty    Before surgery, you can play an important role. Because skin is not sterile, your skin needs to be as free of germs as possible. You can reduce the number of germs on your skin by using the following products. Benzoyl Peroxide Gel Reduces the number of germs present on the skin Applied twice a day to shoulder area starting two days before surgery    ==================================================================  Please follow these instructions carefully:  BENZOYL PEROXIDE 5% GEL  Please do not use if you have an allergy to benzoyl peroxide.   If your skin becomes reddened/irritated stop using the benzoyl peroxide.  Starting two days before surgery, apply as follows: Apply benzoyl peroxide in the morning and at night. Apply after taking a shower. If you are not taking a shower clean entire shoulder front, back, and side along with the armpit with a clean wet washcloth.  Place a quarter-sized dollop on your shoulder and rub in thoroughly, making sure to cover the front, back, and side of your shoulder, along with the armpit.   2 days before ____ AM   ____ PM  1 day before ____ AM   ____ PM                         Do this twice a day for two days.  (Last application is the night before surgery, AFTER using the CHG soap as described below).  Do NOT apply benzoyl peroxide gel on the day of surgery.  [place] - Preparing for Surgery Before surgery, you can play an important role.  Because skin is not sterile, your skin needs to be as free of germs as possible.   You can reduce the number of germs on your skin by washing with CHG (chlorahexidine gluconate) soap before surgery.  CHG is an antiseptic cleaner which kills germs and bonds with the skin to continue killing germs even after washing. Please DO NOT use if you have an allergy to CHG or antibacterial soaps.  If your skin becomes reddened/irritated stop using the CHG and inform your nurse when you arrive at Short Stay. Do not shave (including legs and underarms) for at least 48 hours prior to the first CHG shower.  You may shave your face/neck.  Please follow these instructions carefully:  1.  Shower with CHG Soap the night before surgery and the  morning of surgery.  2.  If you choose to wash your hair, wash your hair first as usual with your normal  shampoo.  3.  After you shampoo, rinse your hair and body thoroughly to remove the shampoo.                             4.  Use CHG as you would any other liquid soap.  You can apply chg directly to the skin and wash.  Gently with a scrungie or clean washcloth.  5.  Apply the CHG Soap to your body ONLY FROM THE NECK DOWN.   Do   not use on face/ open                           Wound or open sores. Avoid contact with eyes, ears mouth and   genitals (private parts).                       Wash face,  Genitals (private parts) with your normal soap.             6.  Wash thoroughly, paying special attention to the area where your    surgery  will be performed.  7.  Thoroughly rinse your body with warm water from the neck down.  8.  DO NOT shower/wash with your normal soap after using and rinsing off the CHG Soap.                9.  Pat yourself dry with a clean towel.            10.  Wear clean pajamas.            11.  Place clean sheets on your bed the night of your first shower and do not  sleep with pets. Day of Surgery : Do not apply any lotions/deodorants the morning of surgery.  Please wear clean clothes to the hospital/surgery center.  FAILURE TO  FOLLOW THESE INSTRUCTIONS MAY RESULT IN THE CANCELLATION OF YOUR SURGERY  PATIENT SIGNATURE_________________________________  NURSE SIGNATURE__________________________________  ________________________________________________________________________  Incentive Spirometer  An incentive spirometer is a tool that can help keep your lungs clear and active. This tool measures how well you are filling your lungs with each breath. Taking long deep breaths may help reverse or decrease the chance of developing breathing (pulmonary) problems (especially infection) following: A long period of time when you are unable to move or be active. BEFORE THE PROCEDURE  If the spirometer includes an indicator to show your best effort, your nurse or respiratory therapist will set it to a desired goal. If possible, sit up straight or lean slightly forward. Try not to slouch. Hold the incentive spirometer in an upright position. INSTRUCTIONS FOR USE  Sit on the edge of your bed if possible, or sit up as far as you can in bed or on a chair. Hold the incentive spirometer in an upright position. Breathe out normally. Place the mouthpiece in your mouth and seal your lips tightly around it. Breathe in slowly and as deeply as possible, raising the piston or the ball toward the top of the column. Hold your breath for 3-5 seconds or for as long as possible. Allow the piston or ball to fall to the bottom of the column. Remove the mouthpiece from your mouth and breathe out normally. Rest for a few seconds and repeat Steps 1 through 7 at least 10 times every 1-2 hours when you are awake. Take your time and take a few normal breaths between deep breaths. The spirometer may include an indicator to show your best effort. Use the indicator as a goal to work toward during each repetition. After each set of 10 deep breaths, practice coughing to be sure your lungs are clear. If you have an incision (the cut made at the time of  surgery), support your incision when coughing by placing a pillow or rolled up towels firmly against it. Once you are able to get out of bed, walk around indoors and cough well. You may stop using the incentive spirometer when instructed by your caregiver.  RISKS AND COMPLICATIONS Take your time so you do not get dizzy or light-headed. If you are in pain, you may need to take or ask for pain medication before doing incentive spirometry. It is harder to take a deep breath if you are having pain. AFTER USE Rest and breathe slowly and easily. It can be helpful to keep track of a log of your progress. Your caregiver can provide you with a simple table to help with this. If you are using the spirometer at home, follow these instructions: [place] IF:  You are having difficultly using the spirometer. You have trouble using the spirometer as often as instructed. Your pain medication is not giving enough relief while using the spirometer. You develop fever of 100.5 F (38.1 C) or higher. SEEK IMMEDIATE MEDICAL CARE IF:  You cough up bloody sputum that had not been present before. You develop fever of 102 F (38.9 C) or greater. You develop worsening pain at or near the incision site. MAKE SURE YOU:  Understand these instructions. Will watch your condition. Will get help right away if you are not doing well or get worse. Document Released: 03/19/2007 Document Revised: 01/29/2012 Document Reviewed: 05/20/2007 [facility] Patient Information 2014 [place], [place].   ________________________________________________________________________

## 2022-10-06 NOTE — Progress Notes (Addendum)
COVID Vaccine Completed:  No  Date of COVID positive in last 90 days:  No  PCP - Cristie Hem, MD Cardiologist - Sanda Klein, MD  Cardiac clearance in Epic dated 09-29-22 by Jory Sims, DNP  Chest x-ray - N/A EKG - 09-29-22 Epic Stress Test - 2019 Epic ECHO - 12-26-18 Epic Cardiac Cath - 2016 Epic Pacemaker/ICD device last checked: Spinal Cord Stimulator:  Bowel Prep - N/A  Sleep Study - N/A CPAP -   Fasting Blood Sugar - N/A Checks Blood Sugar _____ times a day  Last dose of GLP1 agonist-  N/A GLP1 instructions:  N/A   Last dose of SGLT-2 inhibitors-  N/A SGLT-2 instructions: N/A  Blood Thinner Instructions: Aspirin Instructions:  ASA 81.  Hold 81 yo 7 days before surgery.  Pt is aware Last Dose:  Activity level:  Unable to climb stairs due to back pain and bilateral lymphedema legs.  Able to perform activities of daily living without stopping and without symptoms of chest pain or shortness of breath.   Anesthesia review:  CAD, hx of TIA, HTN, Hx of MI.  Lymphedema bilateral legs  Patient denies shortness of breath, fever, cough and chest pain at PAT appointment  Patient verbalized understanding of instructions that were given to them at the PAT appointment. Patient was also instructed that they will need to review over the PAT instructions again at home before surgery.

## 2022-10-17 ENCOUNTER — Other Ambulatory Visit: Payer: Self-pay

## 2022-10-17 ENCOUNTER — Encounter (HOSPITAL_COMMUNITY): Payer: Self-pay

## 2022-10-17 ENCOUNTER — Encounter (HOSPITAL_COMMUNITY)
Admission: RE | Admit: 2022-10-17 | Discharge: 2022-10-17 | Disposition: A | Discharge status: Home / Self Care | Payer: Medicare Other | Source: Ambulatory Visit | Attending: Orthopedic Surgery | Admitting: Orthopedic Surgery

## 2022-10-17 VITALS — BP 155/73 | HR 52 | Temp 98.2°F | Resp 16 | Ht 65.0 in | Wt 238.0 lb

## 2022-10-17 DIAGNOSIS — I2510 Atherosclerotic heart disease of native coronary artery without angina pectoris: Secondary | ICD-10-CM | POA: Insufficient documentation

## 2022-10-17 DIAGNOSIS — Z01812 Encounter for preprocedural laboratory examination: Secondary | ICD-10-CM | POA: Insufficient documentation

## 2022-10-17 DIAGNOSIS — Z01818 Encounter for other preprocedural examination: Secondary | ICD-10-CM

## 2022-10-17 LAB — BASIC METABOLIC PANEL
Anion gap: 9 (ref 5–15)
BUN: 17 mg/dL (ref 8–23)
CO2: 26 mmol/L (ref 22–32)
Calcium: 9.7 mg/dL (ref 8.9–10.3)
Chloride: 108 mmol/L (ref 98–111)
Creatinine, Ser: 0.93 mg/dL (ref 0.44–1.00)
GFR, Estimated: >60 mL/min (ref >60)
Glucose, Bld: 99 mg/dL (ref 70–99)
Potassium: 3.8 mmol/L (ref 3.5–5.1)
Sodium: 143 mmol/L (ref 135–145)

## 2022-10-17 LAB — CBC
HCT: 45 % (ref 36.0–46.0)
Hemoglobin: 14.3 g/dL (ref 12.0–15.0)
MCH: 31.2 pg (ref 26.0–34.0)
MCHC: 31.8 g/dL (ref 30.0–36.0)
MCV: 98 fL (ref 80.0–100.0)
Platelets: DECREASED 10*3/uL (ref 150–400)
RBC: 4.59 MIL/uL (ref 3.87–5.11)
RDW: 13.2 % (ref 11.5–15.5)
WBC: 7.2 10*3/uL (ref 4.0–10.5)
nRBC: 0 % (ref 0.0–0.2)

## 2022-10-17 LAB — SURGICAL PCR SCREEN
MRSA, PCR: NEGATIVE
Staphylococcus aureus: NEGATIVE

## 2022-10-17 LAB — NO BLOOD PRODUCTS

## 2022-10-17 NOTE — Progress Notes (Signed)
Patient has history of dementia but able to sign consent at PAT appointment.  Patient alert and oriented to person, place, date, situation.  Daughter with patient states that patient able to sign own consents, just not able to drive any longer due to memory issues.

## 2022-10-19 NOTE — Progress Notes (Signed)
Anesthesia Chart Review   Case: [id number] Date/Time: 10/26/22 1445   Procedure: REVERSE SHOULDER ARTHROPLASTY (Right: Shoulder) - 136mn   Anesthesia type: General   Pre-op diagnosis: Right shoulder rotator cuff tear arthropathy   [facility]   Surgeons: SJustice Britain MD       DISCUSSION:81 y.o. never smoker with h/o PONV, HTN, CAD, left breast cancer, right shoulder rotator cuff tear scheduled for above procedure 10/26/2022 with Dr. KJustice Britain   Normal coronary arteries per cardiac cath in 2005, follow-up nuclear medicine stress test in 2019 was nonischemic.   Pt last seen by cardiology 09/29/2022. Per OV note, "Chart reviewed as part of pre-operative protocol coverage. Given past medical history and time since last visit, based on ACC/AHA guidelines, Ashley Zacharwould be at acceptable risk for the planned procedure without further cardiovascular testing.  Unable to determine METS as the patient is very sedentary using a walker for ambulation for brief periods of time today due to significant lymphedema.  The patient may hold aspirin 5 to 7 days prior to planned surgery.  Her last dose was on 09/28/2022.  Preoperative labs per surgery "  Anticipate pt can proceed with planned procedure barring acute status change.   VS: BP (!) 155/73   Pulse (!) 52   Temp 36.8 C (Oral)   Resp 16   Ht '5\' 5"'$  (1.651 m)   Wt 108 kg   SpO2 97%   BMI 39.61 kg/m   PROVIDERS: WMichael Boston MD is PCP   Cardiologist - MSanda Klein MD  LABS: Labs reviewed: Acceptable for surgery. (all labs ordered are listed, but only abnormal results are displayed)  Labs Reviewed  SURGICAL PCR SCREEN  BASIC METABOLIC PANEL  CBC  NO BLOOD PRODUCTS     IMAGES:   EKG:   CV: Echo 12/26/2018 1. The left ventricle has normal systolic function of 589-38% The cavity  size was normal. There is no increased left ventricular wall thickness.  Echo evidence of normal diastolic relaxation.   2.  The right ventricle has normal systolic function. The cavity was  normal. There is no increase in right ventricular wall thickness. Right  ventricular systolic pressure could not be assessed.   3. Left atrial size was moderately dilated.   4. The mitral valve is normal in structure. There is mild mitral annular  calcification present.   5. The tricuspid valve is normal in structure.   6. The aortic valve is normal in structure.   7. The pulmonic valve was normal in structure.   Myocardial Perfusion 05/16/2018 Nuclear stress EF: 45%. The left ventricular ejection fraction is mildly decreased (45-54%). There was no ST segment deviation noted during stress. The study is normal. This is a low risk study.   Low risk stress nuclear study with normal perfusion and mildly reduced left ventricular global systolic function. Compared to 2014, perfusion pattern has normalized, but LVEF has decreased. Possible gating artifact, suggest correlation with echo. Past Medical History:  Diagnosis Date   Anginal pain ([facility]    sees Dr WRollene Fare reportedly negative stress/echo 2012   Arthritis    "hands, elbows, shoulders, and back" (06/28/2018)   Breast cancer, left breast (HCC)    Carpal tunnel syndrome    bilaterally   Chronic lower back pain    Complication of anesthesia    "difficutly waking up and hypothermia and stopped breathing"   Confusion    Dementia ([facility]    "stage 4;  w/both long term and short term memory loss" (06/28/2018)   Depression    Diverticulosis    Esophageal reflux    Hypertension    sees [name]   Hypothyroidism    IBS (irritable bowel syndrome)    IBS (irritable bowel syndrome)    Melanoma ([place])    2nd digit right hand; bottom of left foot" (06/28/2018)   Migraines    "stopped having them 3-4 years ago" (06/28/2018)   Myocardial infarction [facility])    "a mini one; early 2000s maybe"   Osteoporosis    PONV (postoperative nausea and vomiting)    Woke up during surgery    Refusal of blood transfusions as patient is Jehovah's Witness    Scoliosis     Past Surgical History:  Procedure Laterality Date   ANKLE SURGERY Left 10/2007   "fell at work; don't know what they did to it"   BREAST BIOPSY Left "X 2"   BREAST LUMPECTOMY Left    BREAST SURGERY Left    "removed scar tissue"   CARDIAC CATHETERIZATION  X 2   CARDIOVASCULAR STRESS TEST     CARPAL TUNNEL RELEASE Right 1995   CARPAL TUNNEL RELEASE Left    CATARACT EXTRACTION W/ INTRAOCULAR LENS  IMPLANT, BILATERAL     CHOLECYSTECTOMY  10/04/2012   Procedure: LAPAROSCOPIC CHOLECYSTECTOMY WITH INTRAOPERATIVE CHOLANGIOGRAM;  Surgeon: Adin Hector, MD;  Location: [place];  Service: General;  Laterality: N/A;  laparosopic cholecystectomy with cholangiogram and repair of umbilical hernia   COLONOSCOPY  08/30/11   DILATION AND CURETTAGE OF UTERUS     DOPPLER ECHOCARDIOGRAPHY     hx    ERCP  10/05/2012   Procedure: ENDOSCOPIC RETROGRADE CHOLANGIOPANCREATOGRAPHY (ERCP);  Surgeon: Jeryl Columbia, MD;  Location: [facility] ENDOSCOPY;  Service: Endoscopy;  Laterality: N/A;   ESOPHAGOGASTRODUODENOSCOPY  08/30/11   HERNIA REPAIR  10/02/2014   "in my stomach; took it out w/gallbladder"   [place] Bilateral    "earlobes"   KNEE ARTHROSCOPY Right 06/2007   MELANOMA EXCISION Bilateral    2nd digit right hand; bottom of left foot" (06/28/2018)   SHOULDER ARTHROSCOPY Right 06/2011   SKIN GRAFT Right    "related to melanoma restricting my finger bending"   TOTAL HIP ARTHROPLASTY Right 06/28/2018   TOTAL HIP ARTHROPLASTY Right 06/28/2018   Procedure: RIGHT TOTAL HIP ARTHROPLASTY ANTERIOR APPROACH;  Surgeon: Frederik Pear, MD;  Location: [place];  Service: Orthopedics;  Laterality: Right;   TOTAL HIP ARTHROPLASTY Left 01/17/2021   Procedure: LEFT TOTAL HIP ARTHROPLASTY ANTERIOR APPROACH;  Surgeon: Frederik Pear, MD;  Location[facility];  Service: Orthopedics;  Laterality: Left;   TOTAL KNEE ARTHROPLASTY  Right 08/2008   TUBAL LIGATION      MEDICATIONS:  aspirin EC 81 MG tablet   Calcium Carbonate-Vit D-Min (CALCIUM 1200 PO)   Cholecalciferol (VITAMIN D-3) 1000 units CAPS   Cyanocobalamin (B-12 PO)   donepezil (ARICEPT) 10 MG tablet   gabapentin (NEURONTIN) 600 MG tablet   hydrochlorothiazide (HYDRODIURIL) 25 MG tablet   HYDROcodone-acetaminophen (NORCO/VICODIN) 5-325 MG tablet   hypromellose (GENTEAL) 0.3 % GEL ophthalmic ointment   levothyroxine (SYNTHROID, LEVOTHROID) 50 MCG tablet   losartan (COZAAR) 100 MG tablet   memantine (NAMENDA) 10 MG tablet   Multiple Vitamin (MULTIVITAMIN WITH MINERALS) TABS tablet   Multiple Vitamins-Minerals (PRESERVISION AREDS PO)   propranolol (INDERAL) 80 MG tablet   rosuvastatin (CRESTOR) 20 MG tablet   sodium chloride (MURO 128) 5 %  ophthalmic solution   tiZANidine (ZANAFLEX) 2 MG tablet   traMADol (ULTRAM) 50 MG tablet   trolamine salicylate (BLUE-EMU HEMP) 10 % cream   Turmeric 500 MG TABS   No current facility-administered medications for this encounter.    Konrad Felix Ward, PA-C [facility] Pre-Surgical Testing [phone]

## 2022-10-26 ENCOUNTER — Ambulatory Visit (HOSPITAL_BASED_OUTPATIENT_CLINIC_OR_DEPARTMENT_OTHER): Payer: Medicare Other | Admitting: Anesthesiology

## 2022-10-26 ENCOUNTER — Other Ambulatory Visit: Payer: Self-pay

## 2022-10-26 ENCOUNTER — Ambulatory Visit (HOSPITAL_COMMUNITY): Payer: Medicare Other | Admitting: Physician Assistant

## 2022-10-26 ENCOUNTER — Ambulatory Visit (HOSPITAL_COMMUNITY)
Admission: RE | Admit: 2022-10-26 | Discharge: 2022-10-26 | Disposition: A | Discharge status: Home / Self Care | Payer: Medicare Other | Attending: Orthopedic Surgery | Admitting: Orthopedic Surgery

## 2022-10-26 ENCOUNTER — Encounter (HOSPITAL_COMMUNITY)
Admission: RE | Disposition: A | Discharge status: Home / Self Care | Payer: Self-pay | Source: Home / Self Care | Attending: Orthopedic Surgery

## 2022-10-26 ENCOUNTER — Encounter (HOSPITAL_COMMUNITY): Payer: Self-pay | Admitting: Orthopedic Surgery

## 2022-10-26 DIAGNOSIS — I252 Old myocardial infarction: Secondary | ICD-10-CM | POA: Diagnosis not present

## 2022-10-26 DIAGNOSIS — I10 Essential (primary) hypertension: Secondary | ICD-10-CM | POA: Insufficient documentation

## 2022-10-26 DIAGNOSIS — I2510 Atherosclerotic heart disease of native coronary artery without angina pectoris: Secondary | ICD-10-CM | POA: Diagnosis not present

## 2022-10-26 DIAGNOSIS — M12811 Other specific arthropathies, not elsewhere classified, right shoulder: Secondary | ICD-10-CM | POA: Diagnosis not present

## 2022-10-26 DIAGNOSIS — Z6839 Body mass index (BMI) 39.0-39.9, adult: Secondary | ICD-10-CM | POA: Insufficient documentation

## 2022-10-26 DIAGNOSIS — K589 Irritable bowel syndrome without diarrhea: Secondary | ICD-10-CM | POA: Diagnosis not present

## 2022-10-26 DIAGNOSIS — K219 Gastro-esophageal reflux disease without esophagitis: Secondary | ICD-10-CM | POA: Insufficient documentation

## 2022-10-26 DIAGNOSIS — M19011 Primary osteoarthritis, right shoulder: Secondary | ICD-10-CM | POA: Diagnosis not present

## 2022-10-26 DIAGNOSIS — E039 Hypothyroidism, unspecified: Secondary | ICD-10-CM | POA: Insufficient documentation

## 2022-10-26 DIAGNOSIS — M75101 Unspecified rotator cuff tear or rupture of right shoulder, not specified as traumatic: Secondary | ICD-10-CM | POA: Diagnosis present

## 2022-10-26 DIAGNOSIS — F0390 Unspecified dementia without behavioral disturbance: Secondary | ICD-10-CM | POA: Diagnosis not present

## 2022-10-26 DIAGNOSIS — I25119 Atherosclerotic heart disease of native coronary artery with unspecified angina pectoris: Secondary | ICD-10-CM

## 2022-10-26 HISTORY — PX: REVERSE SHOULDER ARTHROPLASTY: SHX5054

## 2022-10-26 SURGERY — ARTHROPLASTY, SHOULDER, TOTAL, REVERSE
Anesthesia: General | Site: Shoulder | Laterality: Right

## 2022-10-26 MED ORDER — TRAMADOL HCL 50 MG PO TABS: 50.0000 mg | ORAL_TABLET | Freq: Four times a day (QID) | ORAL | 0 refills | Status: DC | PRN

## 2022-10-26 MED ORDER — LIDOCAINE 2% (20 MG/ML) 5 ML SYRINGE
INTRAMUSCULAR | Status: DC | PRN
  Administered 2022-10-26: 100 mg via INTRAVENOUS

## 2022-10-26 MED ORDER — BUPIVACAINE HCL (PF) 0.5 % IJ SOLN
INTRAMUSCULAR | Status: DC | PRN
  Administered 2022-10-26: 10 mL via PERINEURAL

## 2022-10-26 MED ORDER — LIDOCAINE HCL (PF) 2 % IJ SOLN
INTRAMUSCULAR | Status: AC
  Filled 2022-10-26: qty 5

## 2022-10-26 MED ORDER — PHENYLEPHRINE 80 MCG/ML (10ML) SYRINGE FOR IV PUSH (FOR BLOOD PRESSURE SUPPORT)
PREFILLED_SYRINGE | INTRAVENOUS | Status: AC
  Filled 2022-10-26: qty 10

## 2022-10-26 MED ORDER — ONDANSETRON HCL 4 MG/2ML IJ SOLN
INTRAMUSCULAR | Status: DC | PRN
  Administered 2022-10-26: 4 mg via INTRAVENOUS

## 2022-10-26 MED ORDER — ONDANSETRON HCL 4 MG PO TABS: 4.0000 mg | ORAL_TABLET | Freq: Three times a day (TID) | ORAL | 0 refills | Status: DC | PRN

## 2022-10-26 MED ORDER — 0.9 % SODIUM CHLORIDE (POUR BTL) OPTIME
TOPICAL | Status: DC | PRN
  Administered 2022-10-26: 1000 mL

## 2022-10-26 MED ORDER — PROPOFOL 10 MG/ML IV BOLUS
INTRAVENOUS | Status: DC | PRN
  Administered 2022-10-26: 140 mg via INTRAVENOUS

## 2022-10-26 MED ORDER — ROCURONIUM BROMIDE 10 MG/ML (PF) SYRINGE
PREFILLED_SYRINGE | INTRAVENOUS | Status: DC | PRN
  Administered 2022-10-26: 70 mg via INTRAVENOUS

## 2022-10-26 MED ORDER — CELECOXIB 200 MG PO CAPS
200.0000 mg | ORAL_CAPSULE | Freq: Once | ORAL | Status: AC
  Administered 2022-10-26: 200 mg via ORAL
  Filled 2022-10-26: qty 1

## 2022-10-26 MED ORDER — EPHEDRINE SULFATE-NACL 50-0.9 MG/10ML-% IV SOSY
PREFILLED_SYRINGE | INTRAVENOUS | Status: DC | PRN
  Administered 2022-10-26 (×4): 10 mg via INTRAVENOUS
  Administered 2022-10-26: 5 mg via INTRAVENOUS

## 2022-10-26 MED ORDER — VANCOMYCIN HCL 1000 MG IV SOLR
INTRAVENOUS | Status: AC
  Filled 2022-10-26: qty 20

## 2022-10-26 MED ORDER — VANCOMYCIN HCL 1000 MG IV SOLR
INTRAVENOUS | Status: DC | PRN
  Administered 2022-10-26: 1000 mg via TOPICAL

## 2022-10-26 MED ORDER — LACTATED RINGERS IV SOLN: INTRAVENOUS | Status: DC

## 2022-10-26 MED ORDER — CEFAZOLIN SODIUM-DEXTROSE 2-4 GM/100ML-% IV SOLN
2.0000 g | INTRAVENOUS | Status: AC
  Administered 2022-10-26: 2 g via INTRAVENOUS
  Filled 2022-10-26: qty 100

## 2022-10-26 MED ORDER — EPHEDRINE 5 MG/ML INJ
INTRAVENOUS | Status: AC
  Filled 2022-10-26: qty 5

## 2022-10-26 MED ORDER — ORAL CARE MOUTH RINSE: 15.0000 mL | Freq: Once | OROMUCOSAL | Status: AC

## 2022-10-26 MED ORDER — AMISULPRIDE (ANTIEMETIC) 5 MG/2ML IV SOLN: 10.0000 mg | Freq: Once | INTRAVENOUS | Status: DC | PRN

## 2022-10-26 MED ORDER — SUGAMMADEX SODIUM 200 MG/2ML IV SOLN
INTRAVENOUS | Status: DC | PRN
  Administered 2022-10-26: 200 mg via INTRAVENOUS

## 2022-10-26 MED ORDER — HYDROCODONE-ACETAMINOPHEN 5-325 MG PO TABS: 1.0000 | ORAL_TABLET | Freq: Four times a day (QID) | ORAL | 0 refills | Status: DC | PRN

## 2022-10-26 MED ORDER — TRANEXAMIC ACID-NACL 1000-0.7 MG/100ML-% IV SOLN
1000.0000 mg | INTRAVENOUS | Status: AC
  Administered 2022-10-26: 1000 mg via INTRAVENOUS
  Filled 2022-10-26: qty 100

## 2022-10-26 MED ORDER — ACETAMINOPHEN 500 MG PO TABS
1000.0000 mg | ORAL_TABLET | Freq: Once | ORAL | Status: AC
  Administered 2022-10-26: 1000 mg via ORAL
  Filled 2022-10-26: qty 2

## 2022-10-26 MED ORDER — GLYCOPYRROLATE 0.2 MG/ML IJ SOLN
INTRAMUSCULAR | Status: DC | PRN
  Administered 2022-10-26 (×2): .1 mg via INTRAVENOUS

## 2022-10-26 MED ORDER — STERILE WATER FOR IRRIGATION IR SOLN
Status: DC | PRN
  Administered 2022-10-26: 2000 mL

## 2022-10-26 MED ORDER — PHENYLEPHRINE HCL-NACL 20-0.9 MG/250ML-% IV SOLN
INTRAVENOUS | Status: DC | PRN
  Administered 2022-10-26: 20 ug/min via INTRAVENOUS

## 2022-10-26 MED ORDER — TIZANIDINE HCL 2 MG PO TABS: 2.0000 mg | ORAL_TABLET | Freq: Four times a day (QID) | ORAL | 0 refills | Status: AC | PRN

## 2022-10-26 MED ORDER — DEXAMETHASONE SODIUM PHOSPHATE 10 MG/ML IJ SOLN
INTRAMUSCULAR | Status: DC | PRN
  Administered 2022-10-26: 4 mg via INTRAVENOUS

## 2022-10-26 MED ORDER — FENTANYL CITRATE PF 50 MCG/ML IJ SOSY: 25.0000 ug | PREFILLED_SYRINGE | INTRAMUSCULAR | Status: DC | PRN

## 2022-10-26 MED ORDER — CHLORHEXIDINE GLUCONATE 0.12 % MT SOLN
15.0000 mL | Freq: Once | OROMUCOSAL | Status: AC
  Administered 2022-10-26: 15 mL via OROMUCOSAL

## 2022-10-26 MED ORDER — FENTANYL CITRATE PF 50 MCG/ML IJ SOSY
50.0000 ug | PREFILLED_SYRINGE | Freq: Once | INTRAMUSCULAR | Status: AC
  Administered 2022-10-26: 50 ug via INTRAVENOUS
  Filled 2022-10-26: qty 2

## 2022-10-26 MED ORDER — BUPIVACAINE LIPOSOME 1.3 % IJ SUSP
INTRAMUSCULAR | Status: DC | PRN
  Administered 2022-10-26: 10 mL via PERINEURAL

## 2022-10-26 SURGICAL SUPPLY — 74 items
ADH SKN CLS APL DERMABOND .7 (GAUZE/BANDAGES/DRESSINGS) ×1
AID PSTN UNV HD RSTRNT DISP (MISCELLANEOUS) ×1
BAG COUNTER SPONGE SURGICOUNT (BAG) IMPLANT
BAG SPEC THK2 15X12 ZIP CLS (MISCELLANEOUS) ×1
BAG SPNG CNTER NS LX DISP (BAG) ×1
BAG ZIPLOCK 12X15 (MISCELLANEOUS) ×2 IMPLANT
BLADE SAW SGTL 83.5X18.5 (BLADE) ×2 IMPLANT
BNDG CMPR 5X4 CHSV STRCH STRL (GAUZE/BANDAGES/DRESSINGS) ×1
BNDG COHESIVE 4X5 TAN STRL LF (GAUZE/BANDAGES/DRESSINGS) ×2 IMPLANT
BSPLAT GLND +2X24 MDLR (Joint) ×1 IMPLANT
COOLER ICEMAN CLASSIC (MISCELLANEOUS) ×2 IMPLANT
COVER BACK TABLE 60X90IN (DRAPES) ×2 IMPLANT
COVER SURGICAL LIGHT HANDLE (MISCELLANEOUS) ×2 IMPLANT
CUP SUT UNIV REVERS 36 NEUTRAL (Cup) IMPLANT
DERMABOND ADVANCED .7 DNX12 (GAUZE/BANDAGES/DRESSINGS) ×2 IMPLANT
DRAPE INCISE IOBAN 66X45 STRL (DRAPES) IMPLANT
DRAPE ORTHO SPLIT 77X108 STRL (DRAPES) ×2
DRAPE SHEET LG 3/4 BI-LAMINATE (DRAPES) ×2 IMPLANT
DRAPE SURG 17X11 SM STRL (DRAPES) ×2 IMPLANT
DRAPE SURG ORHT 6 SPLT 77X108 (DRAPES) ×4 IMPLANT
DRAPE TOP 10253 STERILE (DRAPES) ×2 IMPLANT
DRAPE U-SHAPE 47X51 STRL (DRAPES) ×2 IMPLANT
DRESSING AQUACEL AG SP 3.5X6 (GAUZE/BANDAGES/DRESSINGS) ×2 IMPLANT
DRSG AQUACEL AG ADV 3.5X10 (GAUZE/BANDAGES/DRESSINGS) IMPLANT
DRSG AQUACEL AG SP 3.5X6 (GAUZE/BANDAGES/DRESSINGS) ×1
DRSG TEGADERM 8X12 (GAUZE/BANDAGES/DRESSINGS) ×2 IMPLANT
DURAPREP 26ML APPLICATOR (WOUND CARE) ×2 IMPLANT
ELECT BLADE TIP CTD 4 INCH (ELECTRODE) ×2 IMPLANT
ELECT PENCIL ROCKER SW 15FT (MISCELLANEOUS) ×2 IMPLANT
ELECT REM PT RETURN 15FT ADLT (MISCELLANEOUS) ×2 IMPLANT
FACESHIELD WRAPAROUND (MASK) ×5 IMPLANT
FACESHIELD WRAPAROUND OR TEAM (MASK) ×10 IMPLANT
GLENOID UNI REV MOD 24 +2 LAT (Joint) IMPLANT
GLENOSPHERE 36 +4 LAT/24 (Joint) IMPLANT
GLOVE BIO SURGEON STRL SZ7.5 (GLOVE) ×2 IMPLANT
GLOVE BIO SURGEON STRL SZ8 (GLOVE) ×2 IMPLANT
GLOVE SS BIOGEL STRL SZ 7 (GLOVE) ×2 IMPLANT
GLOVE SS BIOGEL STRL SZ 7.5 (GLOVE) ×2 IMPLANT
GOWN STRL SURGICAL XL XLNG (GOWN DISPOSABLE) ×4 IMPLANT
INSERT HUMERAL 36 +6 (Shoulder) IMPLANT
KIT BASIN OR (CUSTOM PROCEDURE TRAY) ×2 IMPLANT
KIT TURNOVER KIT A (KITS) IMPLANT
MANIFOLD NEPTUNE II (INSTRUMENTS) ×2 IMPLANT
NDL TAPERED W/ NITINOL LOOP (MISCELLANEOUS) ×2 IMPLANT
NEEDLE TAPERED W/ NITINOL LOOP (MISCELLANEOUS) ×1 IMPLANT
NS IRRIG 1000ML POUR BTL (IV SOLUTION) ×2 IMPLANT
PACK SHOULDER (CUSTOM PROCEDURE TRAY) ×2 IMPLANT
PAD ARMBOARD 7.5X6 YLW CONV (MISCELLANEOUS) ×2 IMPLANT
PAD COLD SHLDR WRAP-ON (PAD) ×2 IMPLANT
PIN NITINOL TARGETER 2.8 (PIN) IMPLANT
PIN SET MODULAR GLENOID SYSTEM (PIN) IMPLANT
RESTRAINT HEAD UNIVERSAL NS (MISCELLANEOUS) ×2 IMPLANT
SCREW CENTRAL MOD 30MM (Screw) IMPLANT
SCREW PERI LOCK 5.5X24 (Screw) IMPLANT
SCREW PERI LOCK 5.5X36 (Screw) IMPLANT
SCREW PERIPHERAL 5.5X20 LOCK (Screw) IMPLANT
SLING ARM FOAM STRAP LRG (SOFTGOODS) IMPLANT
SLING ARM FOAM STRAP MED (SOFTGOODS) IMPLANT
SPONGE T-LAP 4X18 ~~LOC~~+RFID (SPONGE) ×2 IMPLANT
STEM HUMERAL UNIVERS SZ8 (Stem) IMPLANT
SUCTION FRAZIER HANDLE 12FR (TUBING) ×1
SUCTION TUBE FRAZIER 12FR DISP (TUBING) ×2 IMPLANT
SUT FIBERWIRE #2 38 T-5 BLUE (SUTURE)
SUT MNCRL AB 3-0 PS2 18 (SUTURE) ×2 IMPLANT
SUT MON AB 2-0 CT1 36 (SUTURE) ×2 IMPLANT
SUT VIC AB 1 CT1 36 (SUTURE) ×2 IMPLANT
SUTURE FIBERWR #2 38 T-5 BLUE (SUTURE) IMPLANT
SUTURE TAPE 1.3 40 TPR END (SUTURE) ×4 IMPLANT
SUTURETAPE 1.3 40 TPR END (SUTURE) ×2
TOWEL OR 17X26 10 PK STRL BLUE (TOWEL DISPOSABLE) ×2 IMPLANT
TOWEL OR NON WOVEN STRL DISP B (DISPOSABLE) ×2 IMPLANT
TUBE SUCTION HIGH CAP CLEAR NV (SUCTIONS) ×2 IMPLANT
TUBING CONNECTING 10 (TUBING) IMPLANT
WATER STERILE IRR 1000ML POUR (IV SOLUTION) ×4 IMPLANT

## 2022-10-26 NOTE — Anesthesia Procedure Notes (Signed)
Procedure Name: Intubation Date/Time: 10/26/2022 2:05 PM  Performed by: Eben Burow, CRNAPre-anesthesia Checklist: Patient identified, Emergency Drugs available, Suction available, Patient being monitored and Timeout performed Patient Re-evaluated:Patient Re-evaluated prior to induction Oxygen Delivery Method: Circle system utilized Preoxygenation: Pre-oxygenation with 100% oxygen Induction Type: IV induction Ventilation: Mask ventilation without difficulty Laryngoscope Size: Mac Grade View: Grade I Tube type: Oral Number of attempts: 1 Airway Equipment and Method: Stylet Placement Confirmation: ETT inserted through vocal cords under direct vision, positive ETCO2 and breath sounds checked- equal and bilateral Secured at: 22 cm Tube secured with: Tape Dental Injury: Teeth and Oropharynx as per pre-operative assessment

## 2022-10-26 NOTE — Discharge Instructions (Signed)

## 2022-10-26 NOTE — Evaluation (Signed)
Occupational Therapy Evaluation Patient Details Name: Ashley Hanson MRN: [id number] DOB: Jun 04, 1941 Today's Date: 10/26/2022   History of Present Illness Patient is an 81 year old woman who presents today for planned right reverse shoulder arthroplasty.   Clinical Impression   Patient presents prior to shoulder replacement for education. Therapist provided education and instruction to patient and daughter in regards to exercises, precautions, positioning, donning upper extremity clothing and bathing while maintaining shoulder precautions, ice and edema management using cuff and cooler and donning/doffing sling. Patient verbalizes familiarity from previous shoulder surgery. Patient and daughter verbalized understanding. Handouts provided to maximize retention of education and patient reports she has been practicing using her left hand. Patient to follow up with MD for further therapy needs.        Recommendations for follow up therapy are one component of a multi-disciplinary discharge planning process, led by the attending physician.  Recommendations may be updated based on patient status, additional functional criteria and insurance authorization.   Follow Up Recommendations  Follow physician's recommendations for discharge plan and follow up therapies     Assistance Recommended at Discharge    Patient can return home with the following A little help with bathing/dressing/bathroom;Assistance with cooking/housework    Functional Status Assessment  Patient has had a recent decline in their functional status and demonstrates the ability to make significant improvements in function in a reasonable and predictable amount of time.  Equipment Recommendations  None recommended by OT    Recommendations for Other Services       Precautions / Restrictions Precautions Precautions: Shoulder Type of Shoulder Precautions: If sitting in controlled environment, ok to come out of sling to give neck a  break. Please sleep in it to protect until follow up in office.    OK to use operative arm for feeding, hygiene and ADLs.  Ok to instruct Pendulums and lap slides as exercises. Ok to use operative arm within the following parameters for ADL purposes    New ROM (8/18)  Ok for PROM, AAROM, AROM within pain tolerance and within the following ROM  ER 20  ABD 45  FE 60    . No internal roation exercises - no internal rotation resistance. Shoulder Interventions: Shoulder sling/immobilizer;Off for dressing/bathing/exercises Precaution Booklet Issued: Yes (comment) Required Braces or Orthoses: Sling Restrictions Weight Bearing Restrictions: Yes RUE Weight Bearing: Non weight bearing      Mobility Bed Mobility                    Transfers                          Balance                                           ADL either performed or assessed with clinical judgement   ADL                                               Vision Patient Visual Report: No change from baseline       Perception     Praxis      Pertinent Vitals/Pain Pain Assessment Pain Assessment: No/denies pain     Hand Dominance Right  Extremity/Trunk Assessment             Communication Communication Communication: No difficulties   Cognition Arousal/Alertness: Awake/alert Behavior During Therapy: WFL for tasks assessed/performed Overall Cognitive Status: Within Functional Limits for tasks assessed                                       General Comments       Exercises     Shoulder Instructions Shoulder Instructions Donning/doffing shirt without moving shoulder: Caregiver independent with task Method for sponge bathing under operated UE: Caregiver independent with task Donning/doffing sling/immobilizer: Caregiver independent with task Correct positioning of sling/immobilizer: Caregiver independent with task Pendulum exercises  (written home exercise program): Caregiver independent with task ROM for elbow, wrist and digits of operated UE: Caregiver independent with task Sling wearing schedule (on at all times/off for ADL's): Caregiver independent with task Proper positioning of operated UE when showering: Caregiver independent with task Dressing change: Caregiver independent with task Positioning of UE while sleeping: Caregiver independent with task    Home Living Family/patient expects to be discharged to:: Private residence Living Arrangements: Children Available Help at Discharge: Available 24 hours/day;Family                         Home Equipment: Conservation officer, nature (2 wheels);Cane - single point          Prior Functioning/Environment Prior Level of Function : Independent/Modified Independent                        OT Problem List: Decreased strength;Decreased range of motion;Pain;Impaired UE functional use      OT Treatment/Interventions:      OT Goals(Current goals can be found in the care plan section) Acute Rehab OT Goals OT Goal Formulation: All assessment and education complete, DC therapy  OT Frequency:      Co-evaluation              AM-PAC OT "6 Clicks" Daily Activity     Outcome Measure Help from another person eating meals?: None Help from another person taking care of personal grooming?: None Help from another person toileting, which includes using toliet, bedpan, or urinal?: None Help from another person bathing (including washing, rinsing, drying)?: None Help from another person to put on and taking off regular upper body clothing?: None Help from another person to put on and taking off regular lower body clothing?: None 6 Click Score: 24   End of Session    Activity Tolerance: Patient tolerated treatment well Patient left: in bed;with family/visitor present  OT Visit Diagnosis: Pain                Time: 9390-3009 OT Time Calculation (min): 17  min Charges:  OT General Charges $OT Visit: 1 Visit OT Evaluation $OT Eval Low Complexity: 1 Low  Gustavo Lah, OTR/L Acute Care Rehab Services  Office [phone]   Lenward Chancellor 10/26/2022, 12:38 PM

## 2022-10-26 NOTE — Op Note (Signed)
10/26/2022  3:37 PM  PATIENT:   Ashley Hanson  81 y.o. female  PRE-OPERATIVE DIAGNOSIS:  Right shoulder rotator cuff tear arthropathy  POST-OPERATIVE DIAGNOSIS: Same  PROCEDURE: Right shoulder reverse arthroplasty lysing a press-fit Arthrex size 8 stem with a neutral metaphysis, +6 polyethylene insert, 36/+4 glenosphere and a small/+2 baseplate  SURGEON:  Minor Iden, Metta Clines M.D.  ASSISTANTS: Jenetta Loges, PA-C  Jenetta Loges, PA-C was utilized as an Environmental consultant throughout this case, essential for help with positioning the patient, positioning extremity, tissue manipulation, implantation of the prosthesis, suture management, wound closure, and intraoperative decision-making.  ANESTHESIA:   General endotracheal and interscalene block with Exparel  EBL: 150 cc  SPECIMEN: None  Drains: None   PATIENT DISPOSITION:  PACU - hemodynamically stable.    PLAN OF CARE: Admit for overnight observation  Brief history:  Patient is an 81 year old female with chronic and progressive increasing right shoulder pain related to severe arthritis and associated rotator cuff dysfunction.  Due to her increasing functional mentations and failure to respond to prolonged attempts at conservative management, she is brought to the operating room this time for planned right shoulder reverse arthroplasty.  Preoperatively, I counseled the patient regarding treatment options and risks versus benefits thereof.  Possible surgical complications were all reviewed including potential for bleeding, infection, neurovascular injury, persistent pain, loss of motion, anesthetic complication, failure of the implant, and possible need for additional surgery. They understand and accept and agrees with our planned procedure.   Procedure in detail:  After undergoing routine preop evaluation the patient received prophylactic antibiotics and scalene block with Exparel was established in the holding area by the anesthesia  department.  Patient was subsequently placed supine on the operating table and underwent the smooth induction of a general endotracheal anesthesia.  Placed into the beachchair position and appropriately padded and protected.  The right shoulder girdle region was sterilely prepped and draped in standard fashion.  Timeout was called.  A deltopectoral approach to the right shoulder is made in approximately 10 cm incision.  Skin flaps were elevated dissection carried deep to the deltopectoral interval was then developed from proximal to distal with the vein taken laterally.  Adhesions divided beneath the deltoid.  The conjoined tendon retracted medially.  The long head biceps tendon was then tenodesed at the upper border of the pectoralis major tendon with the proximal segment unroofed and excised.  Rotator cuff was split from the apex of the bicipital groove to the base of the coracoid and the subscap was separated from the lesser tuberosity using electrocautery and tagged with a pair of suture tape sutures.  Capsular attachments were then divided the humeral neck and the humeral head was delivered through the wound.  An extra medullary guide was then used to outline the proposed humeral head resection which was performed with an oscillating saw at approximately 20 degrees of retroversion.  Wanderer was then used to remove the marginal osteophytes and the metal cap was placed over the Proximal humeral surface.  At this point we exposed the glenoid and performed a circumferential labral resection.  A guidepin was directed into the center of the glenoid and the glenoid was reamed with the central followed by the peripheral reamer to a stable, subchondral bony bed.  Preparation fluid with a drill and tap for a 30 mm lag screw.  Our baseplate was then assembled and was then inserted with vancomycin powder applied to the threads of the lag screw with excellent purchase and fixation achieved.  We then placed the peripheral  locking screws all of which achieved excellent purchase and fixation.  A 36/+4 glenosphere was then impacted onto the baseplate and the central locking screw was placed.  We returned our attention to the metaphysis where the canal was opened by hand reaming we ultimately broached up to a size 8 at approximately 20 degrees of retroversion.  A new 2 point Epsal remained gobs and used.  The metaphysis trial implant was then placed which showed excellent soft tissue balance mobility and stability.  Our trial was then removed.  The final implant was assembled.  The canal was irrigated cleaned and dried with vancomycin powder applied.  The final implant was then seated achieving excellent fixation.  A series of trial reductions were then performed and ultimately felt that the +6 poly gave [place] the best motion stability and soft tissue balance.  Our trial poly was removed.  The implant was cleaned and dried and the final poly was then impacted onto the implant.  Our final reduction was then performed which again showed excellent motion and stability and soft tissue balance on the chart satisfaction.  We then mobilized the subscapularis and confirmed good elasticity.  The subscap was repaired back to the pylorus on the call of the implant.  The wound was then copiously irrigated.  Final hemostasis was obtained.  The balance of the vancomycin powder was then sprayed liberally to the deep soft tissue planes.  The deltopectoral interval was reapproximated the series of figure-of-eight and with Vicryl sutures.  2-0 Monocryl used to close the subcu layer and intracuticular 3-0 Monocryl used to close the skin followed by Dermabond and Aquacel dressing.  The right arm was then placed into a sling and the patient was awakened, extubated, and taken to the recovery room in stable condition.  Marin Shutter MD   Contact # [phone]

## 2022-10-26 NOTE — H&P (Signed)
Ashley Hanson    Chief Complaint: Right shoulder rotator cuff tear arthropathy HPI: The patient is a 81 y.o. female with chronic and progressive increasing right shoulder pain related to severe osteoarthritis and rotator cuff dysfunction.  Due to her increasing functional rotations and failure to respond to prolonged attempts at conservative management, she is brought to the operating room this time for planned right shoulder reverse arthroplasty and  Past Medical History:  Diagnosis Date   Anginal pain [facility])    sees [name]; reportedly negative stress/echo 2012   Arthritis    "hands, elbows, shoulders, and back" (06/28/2018)   Breast cancer, left breast ([place])    Carpal tunnel syndrome    bilaterally   Chronic lower back pain    Complication of anesthesia    "difficutly waking up and hypothermia and stopped breathing"   Confusion    Dementia ([place])    "stage 4; w/both long term and short term memory loss" (06/28/2018)   Depression    Diverticulosis    Esophageal reflux    Hypertension    sees [name]   Hypothyroidism    IBS (irritable bowel syndrome)    IBS (irritable bowel syndrome)    Melanoma ([place])    2nd digit right hand; bottom of left foot" (06/28/2018)   Migraines    "stopped having them 3-4 years ago" (06/28/2018)   Myocardial infarction [facility])    "a mini one; early 2000s maybe"   Osteoporosis    PONV (postoperative nausea and vomiting)    Woke up during surgery   Refusal of blood transfusions as patient is Jehovah's Witness    Scoliosis       Past Surgical History:  Procedure Laterality Date   ANKLE SURGERY Left 10/2007   "fell at work; don't know what they did to it"   BREAST BIOPSY Left "X 2"   BREAST LUMPECTOMY Left    BREAST SURGERY Left    "removed scar tissue"   CARDIAC CATHETERIZATION  X 2   CARDIOVASCULAR STRESS TEST     CARPAL TUNNEL RELEASE Right 1995   CARPAL TUNNEL RELEASE Left    CATARACT EXTRACTION W/ INTRAOCULAR LENS  IMPLANT,  BILATERAL     CHOLECYSTECTOMY  10/04/2012   Procedure: LAPAROSCOPIC CHOLECYSTECTOMY WITH INTRAOPERATIVE CHOLANGIOGRAM;  Surgeon: Adin Hector, MD;  Location: [place];  Service: General;  Laterality: N/A;  laparosopic cholecystectomy with cholangiogram and repair of umbilical hernia   COLONOSCOPY  08/30/11   DILATION AND CURETTAGE OF UTERUS     DOPPLER ECHOCARDIOGRAPHY     hx    ERCP  10/05/2012   Procedure: ENDOSCOPIC RETROGRADE CHOLANGIOPANCREATOGRAPHY (ERCP);  Surgeon: Jeryl Columbia, MD;  [facility] ENDOSCOPY;  Service: Endoscopy;  Laterality: N/A;   ESOPHAGOGASTRODUODENOSCOPY  08/30/11   HERNIA REPAIR  10/02/2014   "in my stomach; took it out w/gallbladder"   [place] Bilateral    "earlobes"   KNEE ARTHROSCOPY Right 06/2007   MELANOMA EXCISION Bilateral    2nd digit right hand; bottom of left foot" (06/28/2018)   SHOULDER ARTHROSCOPY Right 06/2011   SKIN GRAFT Right    "related to melanoma restricting my finger bending"   TOTAL HIP ARTHROPLASTY Right 06/28/2018   TOTAL HIP ARTHROPLASTY Right 06/28/2018   Procedure: RIGHT TOTAL HIP ARTHROPLASTY ANTERIOR APPROACH;  Surgeon: Frederik Pear, MD;  Location: [place];  Service: Orthopedics;  Laterality: Right;   TOTAL HIP ARTHROPLASTY Left 01/17/2021   Procedure: LEFT TOTAL HIP  ARTHROPLASTY ANTERIOR APPROACH;  Surgeon: Frederik Pear, MD;  Location: [facility];  Service: Orthopedics;  Laterality: Left;   TOTAL KNEE ARTHROPLASTY Right 08/2008   TUBAL LIGATION      Family History  Problem Relation Age of Onset   Colon polyps Mother    Breast cancer Sister    Dementia Sister    Sleep apnea Daughter     Social History:  reports that she has never smoked. She has never used smokeless tobacco. She reports that she does not currently use alcohol. She reports that she does not use drugs.  BMI: Estimated body mass index is 39.61 kg/m as calculated from the following:   Height as of 10/17/22: '5\' 5"'$  (1.651 m).   Weight  as of this encounter: 108 kg.  Lab Results  Component Value Date   ALBUMIN 3.4 (L) 07/11/2021   Diabetes: Patient does not have a diagnosis of diabetes.     Smoking Status:   reports that she has never smoked. She has never used smokeless tobacco.     Medications Prior to Admission  Medication Sig Dispense Refill   aspirin EC 81 MG tablet Take 1 tablet (81 mg total) by mouth 2 (two) times daily. 60 tablet 0   Calcium Carbonate-Vit D-Min (CALCIUM 1200 PO) Take 1 tablet by mouth 2 (two) times daily.     Cholecalciferol (VITAMIN D-3) 1000 units CAPS Take 1,000 Units by mouth daily.     Cyanocobalamin (B-12 PO) Take 1 tablet by mouth daily.     donepezil (ARICEPT) 10 MG tablet Take 1 tablet (10 mg total) by mouth every morning. 90 tablet 3   gabapentin (NEURONTIN) 600 MG tablet Take 600 mg by mouth 3 (three) times daily.     hydrochlorothiazide (HYDRODIURIL) 25 MG tablet Take 25 mg by mouth daily.     HYDROcodone-acetaminophen (NORCO/VICODIN) 5-325 MG tablet Take 1 tablet by mouth every 6 (six) hours as needed for moderate pain.     hypromellose (GENTEAL) 0.3 % GEL ophthalmic ointment Place 1 application into both eyes at bedtime.     levothyroxine (SYNTHROID, LEVOTHROID) 50 MCG tablet Take 50 mcg by mouth daily before breakfast.     losartan (COZAAR) 100 MG tablet Take 100 mg by mouth daily.      memantine (NAMENDA) 10 MG tablet Take 1 tablet (10 mg total) by mouth 2 (two) times daily. 180 tablet 3   Multiple Vitamin (MULTIVITAMIN WITH MINERALS) TABS tablet Take 1 tablet by mouth daily.     Multiple Vitamins-Minerals (PRESERVISION AREDS PO) Take 1 capsule by mouth 2 (two) times daily.      propranolol (INDERAL) 80 MG tablet Take 80 mg by mouth 2 (two) times daily.     rosuvastatin (CRESTOR) 20 MG tablet Take 20 mg by mouth at bedtime.     sodium chloride (MURO 128) 5 % ophthalmic solution Place 1 drop into both eyes 4 (four) times daily.      traMADol (ULTRAM) 50 MG tablet Take 50 mg  by mouth 2 (two) times daily as needed for severe pain.     trolamine salicylate (BLUE-EMU HEMP) 10 % cream Apply 1 Application topically as needed for muscle pain.     Turmeric 500 MG TABS Take 500 mg by mouth daily.     tiZANidine (ZANAFLEX) 2 MG tablet Take 1 tablet (2 mg total) by mouth every 6 (six) hours as needed. (Patient not taking: Reported on 10/10/2022) 60 tablet 0     Physical Exam: Right  shoulder demonstrates painful and guarded motion is noted at recent office visits.  Globally decreased strength to manual muscle testing.  She is otherwise grossly neurovascular intact in the right upper extremity.  Plain radiographs confirm changes consistent with severe osteoarthritis with marked bony deformity, complete obliteration of the joint space, and peripheral osteophyte formation.  Vitals  Temp:  [97.9 F (36.6 C)] 97.9 F (36.6 C) (12/07 1156) Pulse Rate:  [43-54] 43 (12/07 1300) Resp:  [15-16] 16 (12/07 1300) BP: (161-174)/(74-83) 162/76 (12/07 1300) SpO2:  [97 %-99 %] 99 % (12/07 1300) Weight:  [510 kg] 108 kg (12/07 1140)  Assessment/Plan  Impression: Right shoulder rotator cuff tear arthropathy  Plan of Action: Procedure(s): REVERSE SHOULDER ARTHROPLASTY  Ellyssa Zagal M Jeremie Giangrande 10/26/2022, 1:42 PM Contact # [phone]

## 2022-10-26 NOTE — Transfer of Care (Signed)
Immediate Anesthesia Transfer of Care Note  Patient: Shellia Hartl  Procedure(s) Performed: REVERSE SHOULDER ARTHROPLASTY (Right: Shoulder)  Patient Location: PACU  Anesthesia Type:General  Level of Consciousness: awake, alert , and patient cooperative  Airway & Oxygen Therapy: Patient Spontanous Breathing and Patient connected to face mask oxygen  Post-op Assessment: Report given to RN and Post -op Vital signs reviewed and stable  Post vital signs: Reviewed and stable  Last Vitals:  Vitals Value Taken Time  BP 143/86 10/26/22 1554  Temp    Pulse 58 10/26/22 1555  Resp 17 10/26/22 1555  SpO2 100 % 10/26/22 1555  Vitals shown include unvalidated device data.  Last Pain:  Vitals:   10/26/22 1300  TempSrc:   PainSc: 0-No pain         Complications: No notable events documented.

## 2022-10-26 NOTE — Anesthesia Procedure Notes (Signed)
Anesthesia Regional Block: Interscalene brachial plexus block   Pre-Anesthetic Checklist: , timeout performed,  Correct Patient, Correct Site, Correct Laterality,  Correct Procedure, Correct Position, site marked,  Risks and benefits discussed,  Surgical consent,  Pre-op evaluation,  At surgeon's request and post-op pain management  Laterality: Upper and Right  Prep: chloraprep       Needles:  Injection technique: Single-shot  Needle Type: Stimulator Needle - 40     Needle Length: 4cm  Needle Gauge: 22     Additional Needles:   Procedures:,,,, ultrasound used (permanent image in chart),,    Narrative:  Start time: 10/26/2022 12:41 PM End time: 10/26/2022 1:01 PM Injection made incrementally with aspirations every 5 mL.  Performed by: Personally  Anesthesiologist: Nolon Nations, MD  Additional Notes: BP cuff, SpO2 and EKG monitors applied. Sedation begun. Nerve location verified with ultrasound. Anesthetic injected incrementally, slowly, and after neg aspirations under direct u/s guidance. Good perineural spread. Tolerated well.

## 2022-10-26 NOTE — Care Plan (Signed)
Ortho Bundle Case Management Note  Patient Details  Name: Ashley Hanson MRN: [id number] Date of Birth: [birth year]                  R Rev TSA on 10-26-22  DCP: Home with dtr who is her caregiver.  DME: Sling and CTU provided by the hospital. PT: EO on 11-26-22 or 11-27-21   DME Arranged:  N/A DME Agency:       Additional Comments: Please contact me with any questions of if this plan should need to change.  Marianne Sofia, RN,CCM [facility]  [phone] 10/26/2022, 3:20 PM

## 2022-10-26 NOTE — Anesthesia Postprocedure Evaluation (Signed)
Anesthesia Post Note  Patient: Ashley Hanson  Procedure(s) Performed: REVERSE SHOULDER ARTHROPLASTY (Right: Shoulder)     Patient location during evaluation: PACU Anesthesia Type: General Level of consciousness: sedated and patient cooperative Pain management: pain level controlled Vital Signs Assessment: post-procedure vital signs reviewed and stable Respiratory status: spontaneous breathing Cardiovascular status: stable Anesthetic complications: no   No notable events documented.  Last Vitals:  Vitals:   10/26/22 1701 10/26/22 1715  BP:  (!) 145/69  Pulse: (!) 45 (!) 42  Resp: 18 12  Temp: (!) 36.4 C   SpO2: 93% 93%    Last Pain:  Vitals:   10/26/22 1715  TempSrc:   PainSc: 0-No pain                 Nolon Nations

## 2022-10-26 NOTE — Anesthesia Preprocedure Evaluation (Addendum)
Anesthesia Evaluation  Patient identified by MRN, date of birth, ID band Patient awake    Reviewed: Allergy & Precautions, NPO status , Patient's Chart, lab work & pertinent test results  History of Anesthesia Complications (+) PONV and history of anesthetic complications  Airway Mallampati: II  TM Distance: >3 FB Neck ROM: Full    Dental  (+) Dental Advisory Given, Edentulous Upper, Partial Lower, Missing, Poor Dentition   Pulmonary neg pulmonary ROS   breath sounds clear to auscultation       Cardiovascular hypertension, + angina  + CAD and + Past MI   Rhythm:Regular Rate:Normal  Echo 2020  1. The left ventricle has normal systolic function of 29-56%. The cavity size was normal. There is no increased left ventricular wall thickness. Echo evidence of normal diastolic relaxation.   2. The right ventricle has normal systolic function. The cavity was normal. There is no increase in right ventricular wall thickness. Right ventricular systolic pressure could not be assessed.   3. Left atrial size was moderately dilated.   4. The mitral valve is normal in structure. There is mild mitral annular calcification present.   5. The tricuspid valve is normal in structure.   6. The aortic valve is normal in structure.   7. The pulmonic valve was normal in structure.    Stress MPS 04/2018  Nuclear stress EF: 45%.  The left ventricular ejection fraction is mildly decreased (45-54%).  There was no ST segment deviation noted during stress.  The study is normal.  This is a low risk study.   Low risk stress nuclear study with normal perfusion and mildly reduced left ventricular global systolic function. Compared to 2014, perfusion pattern has normalized, but LVEF has decreased. Possible gating artifact, suggest correlation with echo.    Neuro/Psych  Headaches PSYCHIATRIC DISORDERS  Depression   Dementia TIA Neuromuscular disease     GI/Hepatic Neg liver ROS,GERD  ,,  Endo/Other  Hypothyroidism  Morbid obesity  Renal/GU negative Renal ROS     Musculoskeletal  (+) Arthritis ,    Abdominal  (+) + obese  Peds  Hematology negative hematology ROS (+)   Anesthesia Other Findings   Reproductive/Obstetrics                             Anesthesia Physical Anesthesia Plan  ASA: 3  Anesthesia Plan: General   Post-op Pain Management: Tylenol PO (pre-op)*, Celebrex PO (pre-op)* and Regional block*   Induction:   PONV Risk Score and Plan: 4 or greater and Ondansetron, Dexamethasone and Treatment may vary due to age or medical condition  Airway Management Planned: Oral ETT  Additional Equipment:   Intra-op Plan:   Post-operative Plan: Extubation in OR  Informed Consent: I have reviewed the patients History and Physical, chart, labs and discussed the procedure including the risks, benefits and alternatives for the proposed anesthesia with the patient or authorized representative who has indicated his/her understanding and acceptance.     Dental advisory given  Plan Discussed with: CRNA  Anesthesia Plan Comments:        Anesthesia Quick Evaluation

## 2022-10-27 ENCOUNTER — Encounter (HOSPITAL_COMMUNITY): Payer: Self-pay | Admitting: Orthopedic Surgery

## 2023-03-07 ENCOUNTER — Other Ambulatory Visit: Payer: Self-pay | Admitting: Internal Medicine

## 2023-03-07 DIAGNOSIS — Z0000 Encounter for general adult medical examination without abnormal findings: Secondary | ICD-10-CM

## 2023-04-09 ENCOUNTER — Encounter: Payer: Self-pay | Admitting: Cardiovascular Disease

## 2023-04-09 ENCOUNTER — Ambulatory Visit: Payer: Medicare Other | Attending: Cardiovascular Disease | Admitting: Cardiovascular Disease

## 2023-04-09 VITALS — BP 139/78 | HR 70 | Ht 65.0 in | Wt 240.2 lb

## 2023-04-09 DIAGNOSIS — R9439 Abnormal result of other cardiovascular function study: Secondary | ICD-10-CM

## 2023-04-09 DIAGNOSIS — E7800 Pure hypercholesterolemia, unspecified: Secondary | ICD-10-CM | POA: Insufficient documentation

## 2023-04-09 DIAGNOSIS — I10 Essential (primary) hypertension: Secondary | ICD-10-CM

## 2023-04-09 DIAGNOSIS — I872 Venous insufficiency (chronic) (peripheral): Secondary | ICD-10-CM | POA: Diagnosis present

## 2023-04-09 DIAGNOSIS — F03A0 Unspecified dementia, mild, without behavioral disturbance, psychotic disturbance, mood disturbance, and anxiety: Secondary | ICD-10-CM | POA: Diagnosis present

## 2023-04-09 DIAGNOSIS — I2510 Atherosclerotic heart disease of native coronary artery without angina pectoris: Secondary | ICD-10-CM

## 2023-04-09 NOTE — Progress Notes (Unsigned)
Cardiology Office Note   Date:  04/11/2023   ID:  Ashley Hanson, DOB 1941/09/08, MRN [id number]  PCP:  Melida Quitter, MD  Cardiologist:  Thurmon Fair, MD  Electrophysiologist:  None   Evaluation Performed:  Follow-Up Visit  Chief Complaint:  HTN  History of Present Illness:    Ashley Hanson is a 82 y.o. female with a longstanding history of systemic hypertension and more recent development of dementia.  She has not had any major medical problems since her last appointment.  Her memory is slowly declining, but she remains pretty functional and has a very positive attitude.  She has chronic lymphedema of the lower extremities and does not have any active problems with ulceration, weeping wounds or erythema.  She continues to wear compression bandages.  She remains quite sedentary.  She denies dyspnea at rest or with activity, chest pain, orthopnea, PND, chest pain, palpitations, syncope or focal neurological complaints.  She has not had any falls or bleeding problems.  Her blood pressure was a little high when she first came in but she admits to being quite excited.  Her blood pressure was normal when I rechecked it about 20 minutes later.  Labs performed last year showed an excellent lipid profile (LDL 32), normal glucose level and renal function and electrolytes.  Her TSH was normal last in January.  In January 2020, she had an episode of altered consciousness where she was poorly responsive and there was concern for possible stroke.  Her work-up included an echocardiogram and bilateral carotid artery Doppler ultrasonography, both tests with completely normal results.  She also had an abnormal MRI of the brain.  Routine labs were unremarkable.  She had bilateral ABIs checked in January 2023, but had noncompressible calf vessels, but the waveform was multiphasic and she had toe waveforms present.  Venous Dopplers did not show DVT.  She has a history of "false positive" nuclear stress test  in the past and had normal coronaries by remote angiography in 2005.  Her most recent nuclear stress test in June 2019 was a frankly normal study.   Past Medical History:  Diagnosis Date   Anginal pain [facility])    sees Dr Alanda Amass; reportedly negative stress/echo 2012   Arthritis    "hands, elbows, shoulders, and back" (06/28/2018)   Breast cancer, left breast (HCC)    Carpal tunnel syndrome    bilaterally   Chronic lower back pain    Complication of anesthesia    "difficutly waking up and hypothermia and stopped breathing"   Confusion    Dementia (HCC)    "stage 4; w/both long term and short term memory loss" (06/28/2018)   Depression    Diverticulosis    Esophageal reflux    Hypertension    sees Dr. Dorma Russell J.Green   Hypothyroidism    IBS (irritable bowel syndrome)    IBS (irritable bowel syndrome)    Melanoma (HCC)    2nd digit right hand; bottom of left foot" (06/28/2018)   Migraines    "stopped having them 3-4 years ago" (06/28/2018)   Myocardial infarction [facility])    "a mini one; early 2000s maybe"   Osteoporosis    PONV (postoperative nausea and vomiting)    Woke up during surgery   Refusal of blood transfusions as patient is Jehovah's Witness    Scoliosis    Past Surgical History:  Procedure Laterality Date   ANKLE SURGERY Left 10/2007   "fell at work; don't know what they did  to it"   BREAST BIOPSY Left "X 2"   BREAST LUMPECTOMY Left    BREAST SURGERY Left    "removed scar tissue"   CARDIAC CATHETERIZATION  X 2   CARDIOVASCULAR STRESS TEST     CARPAL TUNNEL RELEASE Right 1995   CARPAL TUNNEL RELEASE Left    CATARACT EXTRACTION W/ INTRAOCULAR LENS  IMPLANT, BILATERAL     CHOLECYSTECTOMY  10/04/2012   Procedure: LAPAROSCOPIC CHOLECYSTECTOMY WITH INTRAOPERATIVE CHOLANGIOGRAM;  Surgeon: Ernestene Mention, MD;  Location: [facility] OR;  Service: General;  Laterality: N/A;  laparosopic cholecystectomy with cholangiogram and repair of umbilical hernia   COLONOSCOPY  08/30/11    DILATION AND CURETTAGE OF UTERUS     DOPPLER ECHOCARDIOGRAPHY     hx    ERCP  10/05/2012   Procedure: ENDOSCOPIC RETROGRADE CHOLANGIOPANCREATOGRAPHY (ERCP);  Surgeon: Petra Kuba, MD;  Location: [facility] ENDOSCOPY;  Service: Endoscopy;  Laterality: N/A;   ESOPHAGOGASTRODUODENOSCOPY  08/30/11   HERNIA REPAIR  10/02/2014   "in my stomach; took it out w/gallbladder"   JOINT REPLACEMENT     KELOID EXCISION Bilateral    "earlobes"   KNEE ARTHROSCOPY Right 06/2007   MELANOMA EXCISION Bilateral    2nd digit right hand; bottom of left foot" (06/28/2018)   REVERSE SHOULDER ARTHROPLASTY Right 10/26/2022   Procedure: REVERSE SHOULDER ARTHROPLASTY;  Surgeon: Francena Hanly, MD;  Location: [facility] ORS;  Service: Orthopedics;  Laterality: Right;    SHOULDER ARTHROSCOPY Right 06/2011   SKIN GRAFT Right    "related to melanoma restricting my finger bending"   TOTAL HIP ARTHROPLASTY Right 06/28/2018   TOTAL HIP ARTHROPLASTY Right 06/28/2018   Procedure: RIGHT TOTAL HIP ARTHROPLASTY ANTERIOR APPROACH;  Surgeon: Gean Birchwood, MD;  Location: [facility] OR;  Service: Orthopedics;  Laterality: Right;   TOTAL HIP ARTHROPLASTY Left 01/17/2021   Procedure: LEFT TOTAL HIP ARTHROPLASTY ANTERIOR APPROACH;  Surgeon: Gean Birchwood, MD;  Location: [facility] ORS;  Service: Orthopedics;  Laterality: Left;   TOTAL KNEE ARTHROPLASTY Right 08/2008   TUBAL LIGATION       Current Meds  Medication Sig   aspirin EC 81 MG tablet Take 1 tablet (81 mg total) by mouth 2 (two) times daily. (Patient taking differently: Take 81 mg by mouth daily.)   Calcium Carbonate-Vit D-Min (CALCIUM 1200 PO) Take 1 tablet by mouth daily in the afternoon.   Cholecalciferol (VITAMIN D-3) 1000 units CAPS Take 1,000 Units by mouth daily.   Cyanocobalamin (B-12 PO) Take 1 tablet by mouth daily.   diltiazem (CARDIZEM CD) 120 MG 24 hr capsule Take 120 mg by mouth daily.   donepezil (ARICEPT) 10 MG tablet Take 1 tablet (10 mg total) by mouth every morning.   gabapentin  (NEURONTIN) 600 MG tablet Take 600 mg by mouth 3 (three) times daily.   hydrochlorothiazide (HYDRODIURIL) 25 MG tablet Take 25 mg by mouth daily.   HYDROcodone-acetaminophen (NORCO/VICODIN) 5-325 MG tablet Take 1 tablet by mouth every 6 (six) hours as needed for severe pain.   hypromellose (GENTEAL) 0.3 % GEL ophthalmic ointment Place 1 application into both eyes at bedtime.   levothyroxine (SYNTHROID) 100 MCG tablet Take 100 mcg by mouth daily before breakfast.   levothyroxine (SYNTHROID, LEVOTHROID) 50 MCG tablet Take 50 mcg by mouth daily before breakfast.   losartan (COZAAR) 100 MG tablet Take 100 mg by mouth daily.    memantine (NAMENDA) 10 MG tablet Take 1 tablet (10 mg total) by mouth 2 (two) times daily.   Multiple Vitamin (MULTIVITAMIN WITH MINERALS)  TABS tablet Take 1 tablet by mouth daily.   Multiple Vitamins-Minerals (PRESERVISION AREDS PO) Take 1 capsule by mouth daily in the afternoon.   propranolol (INDERAL) 80 MG tablet Take 80 mg by mouth 2 (two) times daily.   rosuvastatin (CRESTOR) 20 MG tablet Take 20 mg by mouth at bedtime.   sodium chloride (MURO 128) 5 % ophthalmic solution Place 1 drop into both eyes 4 (four) times daily.    tiZANidine (ZANAFLEX) 2 MG tablet Take 1 tablet (2 mg total) by mouth every 6 (six) hours as needed.   traMADol (ULTRAM) 50 MG tablet Take 1 tablet (50 mg total) by mouth every 6 (six) hours as needed for moderate pain.   trolamine salicylate (BLUE-EMU HEMP) 10 % cream Apply 1 Application topically as needed for muscle pain.   Turmeric 500 MG TABS Take 500 mg by mouth daily.     Allergies:   Adhesive [tape], Plavix [clopidogrel bisulfate], Warfarin and related, and Other   Social History   Tobacco Use   Smoking status: Never   Smokeless tobacco: Never  Vaping Use   Vaping Use: Never used  Substance Use Topics   Alcohol use: Not Currently   Drug use: Never     Family Hx: The patient's family history includes Breast cancer in her sister;  Colon polyps in her mother; Dementia in her sister; Sleep apnea in her daughter.  ROS:   Please see the history of present illness.   All other systems are reviewed and are negative.   Prior CV studies:   The following studies were reviewed today: Echocardiogram and carotid Dopplers 12/26/2018 Lower extremity arterial and venous Dopplers January 2021  Labs/Other Tests and Data Reviewed:    EKG:  Ordered today, shows sinus rhythm with PACs and a single atrial couplet, borderline prolonged QTc 470 ms.  Recent Labs: 10/17/2022: BUN 17; Creatinine, Ser 0.93; Hemoglobin 14.3; Platelets PLATELET CLUMPS NOTED ON SMEAR, COUNT APPEARS DECREASED; Potassium 3.8; Sodium 143   Recent Lipid Panel No results found for: "CHOL", "TRIG", "HDL", "CHOLHDL", "LDLCALC", "LDLDIRECT"  Wt Readings from Last 3 Encounters:  04/09/23 240 lb 3.2 oz (109 kg)  10/26/22 238 lb (108 kg)  10/17/22 238 lb (108 kg)     Objective:    Vital Signs:  BP 139/78   Pulse 70   Ht 5\' 5"  (1.651 m)   Wt 240 lb 3.2 oz (109 kg)   SpO2 97%   BMI 39.97 kg/m      General: Alert, oriented x3, no distress, morbidly obese Head: no evidence of trauma, PERRL, EOMI, no exophtalmos or lid lag, no myxedema, no xanthelasma; normal ears, nose and oropharynx Neck: normal jugular venous pulsations and no hepatojugular reflux; brisk carotid pulses without delay and no carotid bruits Chest: clear to auscultation, no signs of consolidation by percussion or palpation, normal fremitus, symmetrical and full respiratory excursions Cardiovascular: normal position and quality of the apical impulse, regular rhythm, normal first and second heart sounds, no murmurs, rubs or gallops Abdomen: no tenderness or distention, no masses by palpation, no abnormal pulsatility or arterial bruits, normal bowel sounds, no hepatosplenomegaly Extremities: Wearing elastic compression bandages on her legs Neurological: grossly nonfocal Psych: Normal mood and  affect    ASSESSMENT & PLAN:    HTN: Target BP around 140/90.  She has a history of falls that may have been related to lower blood pressure in the past.  She is taking 4 different antihypertensive medications including beta-blocker, calcium channel blocker, ARB  and thiazide diuretic. History of false-positive nuclear stress test with normal coronary angiography.  Normal left ventricular systolic and diastolic function. Venous ulcer of right lower leg: This has healed.  She has chronic stasis dermatitis and it is important to prevent recurrence of severe edema.  Keep legs elevated and wear compression devices. Dementia: She is on 2 cholinesterase inhibitor medications and has not had problems with bradycardia or other noticeable side effects. HLP: Lipid profile shows excellent parameters on statin therapy.    Medication Adjustments/Labs and Tests Ordered: Current medicines are reviewed at length with the patient today.  Concerns regarding medicines are outlined above.   Tests Ordered: Orders Placed This Encounter  Procedures   EKG 12-Lead   Patient Instructions  Medication Instructions:  No changes *If you need a refill on your cardiac medications before your next appointment, please call your pharmacy*  Follow-Up: At [facility], you and your health needs are our priority.  As part of our continuing mission to provide you with exceptional heart care, we have created designated Provider Care Teams.  These Care Teams include your primary Cardiologist (physician) and Advanced Practice Providers (APPs -  Physician Assistants and Nurse Practitioners) who all work together to provide you with the care you need, when you need it.  We recommend signing up for the patient portal called "MyChart".  Sign up information is provided on this After Visit Summary.  MyChart is used to connect with patients for Virtual Visits (Telemedicine).  Patients are able to view lab/test results,  encounter notes, upcoming appointments, etc.  Non-urgent messages can be sent to your provider as well.   To learn more about what you can do with MyChart, go to [email or web address].    Your next appointment:   1 year(s)  Provider:   Thurmon Fair, MD        Follow Up:  Virtual Visit or In Person  1 year  Signed, Thurmon Fair, MD  04/11/2023 2:50 PM    [facility]

## 2023-04-09 NOTE — Patient Instructions (Signed)
Medication Instructions:  No changes *If you need a refill on your cardiac medications before your next appointment, please call your pharmacy*  Follow-Up: At [facility], you and your health needs are our priority.  As part of our continuing mission to provide you with exceptional heart care, we have created designated Provider Care Teams.  These Care Teams include your primary Cardiologist (physician) and Advanced Practice Providers (APPs -  Physician Assistants and Nurse Practitioners) who all work together to provide you with the care you need, when you need it.  We recommend signing up for the patient portal called "MyChart".  Sign up information is provided on this After Visit Summary.  MyChart is used to connect with patients for Virtual Visits (Telemedicine).  Patients are able to view lab/test results, encounter notes, upcoming appointments, etc.  Non-urgent messages can be sent to your provider as well.   To learn more about what you can do with MyChart, go to [email or web address].    Your next appointment:   1 year(s)  Provider:   Mihai Croitoru, MD     

## 2023-04-11 ENCOUNTER — Encounter: Payer: Self-pay | Admitting: Cardiovascular Disease

## 2023-07-05 ENCOUNTER — Ambulatory Visit
Admission: RE | Admit: 2023-07-05 | Discharge: 2023-07-05 | Disposition: A | Discharge status: Home / Self Care | Payer: Medicare Other | Source: Ambulatory Visit | Attending: Internal Medicine | Admitting: Internal Medicine

## 2023-07-05 DIAGNOSIS — Z0000 Encounter for general adult medical examination without abnormal findings: Secondary | ICD-10-CM

## 2023-07-09 ENCOUNTER — Other Ambulatory Visit: Payer: Self-pay | Admitting: Internal Medicine

## 2023-07-09 DIAGNOSIS — R928 Other abnormal and inconclusive findings on diagnostic imaging of breast: Secondary | ICD-10-CM

## 2023-07-13 ENCOUNTER — Ambulatory Visit
Admission: RE | Admit: 2023-07-13 | Discharge: 2023-07-13 | Disposition: A | Discharge status: Home / Self Care | Payer: Medicare Other | Source: Ambulatory Visit | Attending: Internal Medicine | Admitting: Internal Medicine

## 2023-07-13 ENCOUNTER — Ambulatory Visit: Payer: Medicare Other

## 2023-07-13 DIAGNOSIS — R928 Other abnormal and inconclusive findings on diagnostic imaging of breast: Secondary | ICD-10-CM

## 2023-10-09 ENCOUNTER — Telehealth: Payer: Self-pay | Admitting: Radiation Oncology

## 2023-10-09 NOTE — Telephone Encounter (Signed)
11/19 @ 9:18 am follow up call to Dr. Evans Lance office spoke to [place], they are aware patient is now scheduled with [facility] as requested, but patient is not schedule yet for Keloid remover but referral sent to [facility].

## 2023-10-11 NOTE — Progress Notes (Signed)
Radiation Oncology         [phone] ________________________________  Initial Outpatient Consultation  Name: Ashley Hanson MRN: [id number]  Date: 10/12/2023  DOB: [birth year]  UX:LKGM, Ashley Cowden, MD  Ashley Laroche, MD   REFERRING PHYSICIAN: Caro Laroche, MD  DIAGNOSIS:    ICD-10-CM   1. Keloid  L91.0     2. Keloid scar  L91.0      Keloid scars of the RIGHT medial upper back and posterior RIGHT shoulder  CHIEF COMPLAINT: Here to discuss management of a keloid scar   HISTORY OF PRESENT ILLNESS::Ashley Hanson is a 82 y.o. female who presented to Dr. Melida Hanson at [facility] dermatology on 09/18/23 for evaluation of keloid scar located on her back. The keloid was injected with Kenalog at that time. She was also noted to have keloids on the right superior medial upper back and right posterior shoulder.   Dr. Melida Hanson has recommended that she proceed with excision followed by radiation therapy which we will discuss in detail today.   Of note: she also has a medical history notable for left breast cancer diagnosed in the 90's s/p lumpectomy and left axillary lymph node dissection and a history of melanoma to her right hand and the bottom of her left foot. Upon record review, she did develop a keloid to the left axillary region/Left lateral Torso from her breast surgery/drain placement. Records are limited pertaining to her history of breast cancer, but she reports that she she received radiation therapy to the left breast years ago after breast conserving surgery.  She reports significant skin discoloration during radiation therapy to her left breast.  But she ultimately healed well from the radiation.  She also reports that she has multiple keloids in other parts of her body including her abdomen and her Left torso/ axilla (axillary keloid related to a drain that was placed after oncologic surgery as described above ) but the ones that are most bothersome are in the RIGHT medial back and  the RIGHT posterior shoulder   She reports she had radition to both ears after keloid surgery when she was ~82 years old   PREVIOUS RADIATION THERAPY: Yes, as above: reports she had radition to both ears after keloid surgery when she was ~82 years old Also: Left breast after lumpectomy when she was around 100-38 years old  PAST MEDICAL HISTORY:  has a past medical history of Anginal pain (HCC), Arthritis, Breast cancer, left breast (HCC), Carpal tunnel syndrome, Chronic lower back pain, Complication of anesthesia, Confusion, Dementia (HCC), Depression, Diverticulosis, Esophageal reflux, Hypertension, Hypothyroidism, IBS (irritable bowel syndrome), IBS (irritable bowel syndrome), Melanoma (HCC), Migraines, Myocardial infarction (HCC), Osteoporosis, PONV (postoperative nausea and vomiting), Refusal of blood transfusions as patient is Jehovah's Witness, and Scoliosis.    PAST SURGICAL HISTORY: Past Surgical History:  Procedure Laterality Date   ANKLE SURGERY Left 10/2007   "fell at work; don't know what they did to it"   BREAST BIOPSY Left "X 2"   BREAST LUMPECTOMY Left    BREAST SURGERY Left    "removed scar tissue"   CARDIAC CATHETERIZATION  X 2   CARDIOVASCULAR STRESS TEST     CARPAL TUNNEL RELEASE Right 1995   CARPAL TUNNEL RELEASE Left    CATARACT EXTRACTION W/ INTRAOCULAR LENS  IMPLANT, BILATERAL     CHOLECYSTECTOMY  10/04/2012   Procedure: LAPAROSCOPIC CHOLECYSTECTOMY WITH INTRAOPERATIVE CHOLANGIOGRAM;  Surgeon: Ernestene Mention, MD;  Location: [facility] OR;  Service: General;  Laterality: N/A;  laparosopic cholecystectomy  with cholangiogram and repair of umbilical hernia   COLONOSCOPY  08/30/11   DILATION AND CURETTAGE OF UTERUS     DOPPLER ECHOCARDIOGRAPHY     hx    ERCP  10/05/2012   Procedure: ENDOSCOPIC RETROGRADE CHOLANGIOPANCREATOGRAPHY (ERCP);  Surgeon: Petra Kuba, MD;  Location: [facility] ENDOSCOPY;  Service: Endoscopy;  Laterality: N/A;   ESOPHAGOGASTRODUODENOSCOPY  08/30/11   HERNIA  REPAIR  10/02/2014   "in my stomach; took it out w/gallbladder"   JOINT REPLACEMENT     KELOID EXCISION Bilateral    "earlobes"   KNEE ARTHROSCOPY Right 06/2007   MELANOMA EXCISION Bilateral    2nd digit right hand; bottom of left foot" (06/28/2018)   REVERSE SHOULDER ARTHROPLASTY Right 10/26/2022   Procedure: REVERSE SHOULDER ARTHROPLASTY;  Surgeon: Francena Hanly, MD;  Location: [facility] ORS;  Service: Orthopedics;  Laterality: Right;    SHOULDER ARTHROSCOPY Right 06/2011   SKIN GRAFT Right    "related to melanoma restricting my finger bending"   TOTAL HIP ARTHROPLASTY Right 06/28/2018   TOTAL HIP ARTHROPLASTY Right 06/28/2018   Procedure: RIGHT TOTAL HIP ARTHROPLASTY ANTERIOR APPROACH;  Surgeon: Gean Birchwood, MD;  Location: [facility] OR;  Service: Orthopedics;  Laterality: Right;   TOTAL HIP ARTHROPLASTY Left 01/17/2021   Procedure: LEFT TOTAL HIP ARTHROPLASTY ANTERIOR APPROACH;  Surgeon: Gean Birchwood, MD;  Location: [facility] ORS;  Service: Orthopedics;  Laterality: Left;   TOTAL KNEE ARTHROPLASTY Right 08/2008   TUBAL LIGATION      FAMILY HISTORY: family history includes Breast cancer in her sister; Colon polyps in her mother; Dementia in her sister; Sleep apnea in her daughter.  SOCIAL HISTORY:  reports that she has never smoked. She has never used smokeless tobacco. She reports that she does not currently use alcohol. She reports that she does not use drugs.  ALLERGIES: Adhesive [tape], Plavix [clopidogrel bisulfate], Warfarin and related, Other, and Apixaban  MEDICATIONS:  Current Outpatient Medications  Medication Sig Dispense Refill   aspirin EC 81 MG tablet Take 1 tablet (81 mg total) by mouth 2 (two) times daily. (Patient taking differently: Take 81 mg by mouth daily.) 60 tablet 0   Calcium Carbonate-Vit D-Min (CALCIUM 1200 PO) Take 1 tablet by mouth daily in the afternoon.     Cholecalciferol (VITAMIN D-3) 1000 units CAPS Take 1,000 Units by mouth daily.     Cyanocobalamin (B-12 PO) Take 1  tablet by mouth daily.     diltiazem (CARDIZEM CD) 120 MG 24 hr capsule Take 120 mg by mouth daily.     donepezil (ARICEPT) 10 MG tablet Take 1 tablet (10 mg total) by mouth every morning. 90 tablet 3   gabapentin (NEURONTIN) 600 MG tablet Take 600 mg by mouth 3 (three) times daily.     hydrochlorothiazide (HYDRODIURIL) 25 MG tablet Take 25 mg by mouth daily.     HYDROcodone-acetaminophen (NORCO/VICODIN) 5-325 MG tablet Take 1 tablet by mouth every 6 (six) hours as needed for severe pain. 15 tablet 0   hypromellose (GENTEAL) 0.3 % GEL ophthalmic ointment Place 1 application into both eyes at bedtime.     levothyroxine (SYNTHROID) 100 MCG tablet Take 100 mcg by mouth daily before breakfast.     levothyroxine (SYNTHROID, LEVOTHROID) 50 MCG tablet Take 50 mcg by mouth daily before breakfast.     losartan (COZAAR) 100 MG tablet Take 100 mg by mouth daily.      memantine (NAMENDA) 10 MG tablet Take 1 tablet (10 mg total) by mouth 2 (two) times daily. 180  tablet 3   Multiple Vitamin (MULTIVITAMIN WITH MINERALS) TABS tablet Take 1 tablet by mouth daily.     Multiple Vitamins-Minerals (PRESERVISION AREDS PO) Take 1 capsule by mouth daily in the afternoon.     propranolol (INDERAL) 80 MG tablet Take 80 mg by mouth 2 (two) times daily.     rosuvastatin (CRESTOR) 20 MG tablet Take 20 mg by mouth at bedtime.     sodium chloride (MURO 128) 5 % ophthalmic solution Place 1 drop into both eyes 4 (four) times daily.      tiZANidine (ZANAFLEX) 2 MG tablet Take 1 tablet (2 mg total) by mouth every 6 (six) hours as needed. 30 tablet 0   traMADol (ULTRAM) 50 MG tablet Take 1 tablet (50 mg total) by mouth every 6 (six) hours as needed for moderate pain. 30 tablet 0   trolamine salicylate (BLUE-EMU HEMP) 10 % cream Apply 1 Application topically as needed for muscle pain.     Turmeric 500 MG TABS Take 500 mg by mouth daily.     No current facility-administered medications for this encounter.    REVIEW OF SYSTEMS:   Notable for that above.   PHYSICAL EXAM:  height is 5\' 5"  (1.651 m) and weight is 242 lb 2 oz (109.8 kg). Her temporal temperature is 97.1 F (36.2 C) (abnormal). Her blood pressure is 140/64 (abnormal) and her pulse is 52 (abnormal). Her respiration is 18 and oxygen saturation is 98%.   General: Alert and oriented, in no acute distress   HEENT: Head is normocephalic.    Extremities: Bilateral lower extremity edema.  Skin: Notable for multiple keloids.  Photograph below depicts the 2 keloids that are most symptomatic, for which she desires treatment.  These are located in the Right upper medial back and the Right posterior shoulder. Musculoskeletal: She is sitting in a wheelchair. Neurologic: Cranial nerves II through XII are grossly intact.  Speech is fluent Psychiatric: Judgment and insight are intact. Affect is appropriate.   ECOG = 2  0 - Asymptomatic (Fully active, able to carry on all predisease activities without restriction)  1 - Symptomatic but completely ambulatory (Restricted in physically strenuous activity but ambulatory and able to carry out work of a light or sedentary nature. For example, light housework, office work)  2 - Symptomatic, <50% in bed during the day (Ambulatory and capable of all self care but unable to carry out any work activities. Up and about more than 50% of waking hours)  3 - Symptomatic, >50% in bed, but not bedbound (Capable of only limited self-care, confined to bed or chair 50% or more of waking hours)  4 - Bedbound (Completely disabled. Cannot carry on any self-care. Totally confined to bed or chair)  5 - Death   Santiago Glad MM, Creech RH, Tormey DC, et al. [phone]). "Toxicity and response criteria of the [facility] Group". Am. Evlyn Clines. Oncol. 5 (6): 649-55   LABORATORY DATA:  Lab Results  Component Value Date   WBC 7.2 10/17/2022   HGB 14.3 10/17/2022   HCT 45.0 10/17/2022   MCV 98.0 10/17/2022   PLT  10/17/2022    PLATELET  CLUMPS NOTED ON SMEAR, COUNT APPEARS DECREASED   CMP     Component Value Date/Time   NA 143 10/17/2022 1435   NA 141 05/01/2018 1526   K 3.8 10/17/2022 1435   CL 108 10/17/2022 1435   CO2 26 10/17/2022 1435   GLUCOSE 99 10/17/2022 1435   BUN 17 10/17/2022  1435   BUN 15 05/01/2018 1526   CREATININE 0.93 10/17/2022 1435   CALCIUM 9.7 10/17/2022 1435   PROT 7.5 07/11/2021 1610   PROT 7.1 01/11/2016 1417   ALBUMIN 3.4 (L) 07/11/2021 1610   ALBUMIN 3.9 01/11/2016 1417   AST 21 07/11/2021 1610   ALT 16 07/11/2021 1610   ALKPHOS 107 07/11/2021 1610   BILITOT 0.8 07/11/2021 1610   BILITOT 0.3 01/11/2016 1417   GFRNONAA >60 10/17/2022 1435         RADIOGRAPHY: No results found.    IMPRESSION/PLAN: This is a very pleasant 82 yo woman who has history of multiple keloids.  At present she desires surgery and postoperative radiation if indicated for 2 most symptomatic keloid lesions of the Right upper back.  She is waiting for an appointment with the plastic surgeon.  She has tolerated radiation in the past for keloids related to her ears.  She also has a history notable for left breast radiation therapy after breast conserving surgery as well.  I had an in depth discussion with the patient about the risks benefits and side effects of radiotherapy to the keloid scars postoperatively.  It would be realistic for her to receive radiation treatment to both of the lesions on her right upper back at the same time.  She understands that radiotherapy would need to be planned the same afternoon as in the morning surgery, ideally with the first fraction delivered same day, for best results. This will be followed by 2 more fractions of radiotherapy with 1-2 days of break between each treatment. She understands that treatment will be very targeted, to a limited amount of tissue, to minimize her side effects. She understands that the most common side effects are temporary fatigue as well as skin irritation  which can result in long-term changes in skin pigmentation. The patient understands these risks but is still very enthusiastic about proceeding with treatment.    I plan to treat the patient's scar with tight margins to 15 Gray in 3 fractions. I will use electrons with bolus to allow a high dose to the skin with minimal deep penetration into her normal tissues.   I wrote down some instructions for her to share with her plastic surgeon so that they can coordinate care and ensure that her morning surgery is scheduled on a day when I will also be in the office.  She and her daughter know to call if they have any questions in the interim.  I look forward to seeing her back at the appropriate time.  I wished her the best until then.    On date of service, in total, I spent 45 minutes on this encounter. Patient was seen in person.   __________________________________________   Lonie Peak, MD  This document serves as a record of services personally performed by Lonie Peak, MD. It was created on her behalf by Neena Rhymes, a trained medical scribe. The creation of this record is based on the scribe's personal observations and the provider's statements to them. This document has been checked and approved by the attending provider.

## 2023-10-12 ENCOUNTER — Ambulatory Visit
Admission: RE | Admit: 2023-10-12 | Discharge: 2023-10-12 | Disposition: A | Discharge status: Home / Self Care | Payer: Medicare Other | Source: Ambulatory Visit | Attending: Radiation Oncology | Admitting: Radiation Oncology

## 2023-10-12 VITALS — BP 140/64 | HR 52 | Temp 97.1°F | Resp 18 | Ht 65.0 in | Wt 242.1 lb

## 2023-10-12 DIAGNOSIS — Z85820 Personal history of malignant melanoma of skin: Secondary | ICD-10-CM | POA: Diagnosis not present

## 2023-10-12 DIAGNOSIS — E039 Hypothyroidism, unspecified: Secondary | ICD-10-CM | POA: Diagnosis not present

## 2023-10-12 DIAGNOSIS — Z923 Personal history of irradiation: Secondary | ICD-10-CM | POA: Diagnosis not present

## 2023-10-12 DIAGNOSIS — I10 Essential (primary) hypertension: Secondary | ICD-10-CM | POA: Insufficient documentation

## 2023-10-12 DIAGNOSIS — G8929 Other chronic pain: Secondary | ICD-10-CM | POA: Insufficient documentation

## 2023-10-12 DIAGNOSIS — M419 Scoliosis, unspecified: Secondary | ICD-10-CM | POA: Insufficient documentation

## 2023-10-12 DIAGNOSIS — I252 Old myocardial infarction: Secondary | ICD-10-CM | POA: Insufficient documentation

## 2023-10-12 DIAGNOSIS — Z9049 Acquired absence of other specified parts of digestive tract: Secondary | ICD-10-CM | POA: Insufficient documentation

## 2023-10-12 DIAGNOSIS — Z803 Family history of malignant neoplasm of breast: Secondary | ICD-10-CM | POA: Insufficient documentation

## 2023-10-12 DIAGNOSIS — F0390 Unspecified dementia without behavioral disturbance: Secondary | ICD-10-CM | POA: Insufficient documentation

## 2023-10-12 DIAGNOSIS — L910 Hypertrophic scar: Secondary | ICD-10-CM | POA: Diagnosis present

## 2023-10-12 DIAGNOSIS — Z79890 Hormone replacement therapy (postmenopausal): Secondary | ICD-10-CM | POA: Diagnosis not present

## 2023-10-12 DIAGNOSIS — Z7982 Long term (current) use of aspirin: Secondary | ICD-10-CM | POA: Diagnosis not present

## 2023-10-12 DIAGNOSIS — K219 Gastro-esophageal reflux disease without esophagitis: Secondary | ICD-10-CM | POA: Diagnosis not present

## 2023-10-12 DIAGNOSIS — Z853 Personal history of malignant neoplasm of breast: Secondary | ICD-10-CM | POA: Diagnosis not present

## 2023-10-12 DIAGNOSIS — Z79899 Other long term (current) drug therapy: Secondary | ICD-10-CM | POA: Diagnosis not present

## 2023-10-12 DIAGNOSIS — K589 Irritable bowel syndrome without diarrhea: Secondary | ICD-10-CM | POA: Diagnosis not present

## 2023-10-12 NOTE — Progress Notes (Signed)
Histology and Location of Skin Concern:    Past/Anticipated interventions by patient's surgeon/dermatologist for current problematic lesion, if any:    SAFETY ISSUES: Prior radiation? Yes--reports she had radition to both ears after keloid surgery when she was ~82 years old Left breast after lumpectomy when she was around [age] years old Pacemaker/ICD? No Possible current pregnancy? No--postmenopausal  Is the patient on methotrexate? No  Current Complaints / other details:  Daughter reports they are still waiting to hear from plastic surgeon's office

## 2023-11-28 ENCOUNTER — Ambulatory Visit: Payer: Medicare Other | Admitting: Plastic Surgery

## 2023-11-28 ENCOUNTER — Telehealth: Payer: Self-pay

## 2023-11-28 ENCOUNTER — Encounter: Payer: Self-pay | Admitting: Plastic Surgery

## 2023-11-28 VITALS — BP 158/72 | HR 54 | Ht 65.0 in | Wt 248.0 lb

## 2023-11-28 DIAGNOSIS — L910 Hypertrophic scar: Secondary | ICD-10-CM | POA: Diagnosis not present

## 2023-11-28 NOTE — Telephone Encounter (Signed)
   Name: Ashley Hanson  DOB: 1941-09-22  MRN: [id number]  Primary Cardiologist: Jerel Balding, MD  Chart reviewed as part of pre-operative protocol coverage. Because of [name]'s past medical history and time since last visit, she will require a follow-up in-office visit in order to better assess preoperative cardiovascular risk. She was last seen 03/2023. Given her progressive dementia, virtual visit may be challenging therefore recommend in person OV for objective assessment.  Pre-op covering staff: - Please schedule appointment and call patient to inform them. If patient already had an upcoming appointment within acceptable timeframe, please add pre-op clearance to the appointment notes so provider is aware. - Please contact requesting surgeon's office via preferred method (i.e, phone, fax) to inform them of need for appointment prior to surgery.  On ASA - not specified to hold but do not see cardiac contraindication from holding if needed, can be finalized at OV.  Brandelyn Henne N Tamicka Shimon, PA-C  11/28/2023, 4:19 PM

## 2023-11-28 NOTE — Telephone Encounter (Signed)
   Pre-operative Risk Assessment    Patient Name: Ashley Hanson  DOB: 1940-12-15 MRN: [id number]   Date of last office visit: 04/09/23 Date of next office visit: Not scheduled   Request for Surgical Clearance    Procedure:   Keloid Excision- back  Date of Surgery:  Clearance TBD                                Surgeon:  Not indicated Surgeon's Group or Practice Name:  [facility] Phone number:  [phone] Fax number:  [phone]   Type of Clearance Requested:   - Medical    Type of Anesthesia:  General    Additional requests/questions:    [name]   11/28/2023, 4:00 PM

## 2023-11-28 NOTE — Progress Notes (Signed)
 Referring Provider Dietrich Alyce BROCKS, MD [address] [place],  [place] [address]   CC:  Chief Complaint  Patient presents with   Consult      Ashley Hanson is an 83 y.o. female.  HPI: Ashley Hanson is a very pleasant 83 year old female who is accompanied today by her daughter who is her primary caregiver.  She presents for evaluation of 2 keloids 1 on the upper mid back and 1 on the right posterior shoulder.  Keloids have been present for many years and are the result of 2 skin lesion excisions.  Patient states that she has had trouble most of her life with keloid development and has keloids all over her abdomen and on her left axilla but these 2 keloids in particular are painful and she would like to have them removed if possible.  Patient's past medical history is significant for hypertension and a history of myocardial infarction.  She also has mild cognitive impairment though she had no difficulty with this during the examination today.  Allergies  Allergen Reactions   Adhesive [Tape] Dermatitis    Severe irritation of skin   Plavix [Clopidogrel Bisulfate] Itching and Other (See Comments)    Causes migraines. Itching all over the body.   Warfarin And Related Other (See Comments)    Blood thinners - cause migraines in patient.   Other     Refusal of blood   Apixaban Rash    Outpatient Encounter Medications as of 11/28/2023  Medication Sig   aspirin  EC 81 MG tablet Take 1 tablet (81 mg total) by mouth 2 (two) times daily. (Patient taking differently: Take 81 mg by mouth daily.)   Calcium  Carbonate-Vit D-Min (CALCIUM  1200 PO) Take 1 tablet by mouth daily in the afternoon.   Cholecalciferol  (VITAMIN D -3) 1000 units CAPS Take 1,000 Units by mouth daily.   Cyanocobalamin (B-12 PO) Take 1 tablet by mouth daily.   diltiazem  (CARDIZEM  CD) 120 MG 24 hr capsule Take 120 mg by mouth daily.   donepezil  (ARICEPT ) 10 MG tablet Take 1 tablet (10 mg total) by mouth every morning.    gabapentin  (NEURONTIN ) 600 MG tablet Take 600 mg by mouth 3 (three) times daily.   hydrochlorothiazide  (HYDRODIURIL ) 25 MG tablet Take 25 mg by mouth daily.   HYDROcodone -acetaminophen  (NORCO/VICODIN) 5-325 MG tablet Take 1 tablet by mouth every 6 (six) hours as needed for severe pain.   hypromellose (GENTEAL) 0.3 % GEL ophthalmic ointment Place 1 application into both eyes at bedtime.   levothyroxine  (SYNTHROID ) 100 MCG tablet Take 100 mcg by mouth daily before breakfast.   levothyroxine  (SYNTHROID , LEVOTHROID) 50 MCG tablet Take 50 mcg by mouth daily before breakfast.   losartan  (COZAAR ) 100 MG tablet Take 100 mg by mouth daily.    memantine  (NAMENDA ) 10 MG tablet Take 1 tablet (10 mg total) by mouth 2 (two) times daily.   Multiple Vitamin (MULTIVITAMIN WITH MINERALS) TABS tablet Take 1 tablet by mouth daily.   Multiple Vitamins-Minerals (PRESERVISION AREDS PO) Take 1 capsule by mouth daily in the afternoon.   propranolol  (INDERAL ) 80 MG tablet Take 80 mg by mouth 2 (two) times daily.   rosuvastatin  (CRESTOR ) 20 MG tablet Take 20 mg by mouth at bedtime.   sodium chloride  (MURO 128) 5 % ophthalmic solution Place 1 drop into both eyes 4 (four) times daily.    tiZANidine  (ZANAFLEX ) 2 MG tablet Take 1 tablet (2 mg total) by mouth every 6 (six) hours as needed.  traMADol  (ULTRAM ) 50 MG tablet Take 1 tablet (50 mg total) by mouth every 6 (six) hours as needed for moderate pain.   trolamine salicylate (BLUE-EMU HEMP) 10 % cream Apply 1 Application topically as needed for muscle pain.   Turmeric 500 MG TABS Take 500 mg by mouth daily.   No facility-administered encounter medications on file as of 11/28/2023.     Past Medical History:  Diagnosis Date   Anginal pain [facility])    sees Dr Maye; reportedly negative stress/echo 2012   Arthritis    hands, elbows, shoulders, and back (06/28/2018)   Breast cancer, left breast (HCC)    Carpal tunnel syndrome    bilaterally   Chronic lower back pain     Complication of anesthesia    difficutly waking up and hypothermia and stopped breathing   Confusion    Dementia (HCC)    stage 4; w/both long term and short term memory loss (06/28/2018)   Depression    Diverticulosis    Esophageal reflux    Hypertension    sees Dr. Aliene J.Green   Hypothyroidism    IBS (irritable bowel syndrome)    IBS (irritable bowel syndrome)    Melanoma (HCC)    2nd digit right hand; bottom of left foot (06/28/2018)   Migraines    stopped having them 3-4 years ago (06/28/2018)   Myocardial infarction [facility])    a mini one; early 2000s maybe   Osteoporosis    PONV (postoperative nausea and vomiting)    Woke up during surgery   Refusal of blood transfusions as patient is Jehovah's Witness    Scoliosis     Past Surgical History:  Procedure Laterality Date   ANKLE SURGERY Left 10/2007   fell at work; don't know what they did to it   BREAST BIOPSY Left X 2   BREAST LUMPECTOMY Left    BREAST SURGERY Left    removed scar tissue   CARDIAC CATHETERIZATION  X 2   CARDIOVASCULAR STRESS TEST     CARPAL TUNNEL RELEASE Right 1995   CARPAL TUNNEL RELEASE Left    CATARACT EXTRACTION W/ INTRAOCULAR LENS  IMPLANT, BILATERAL     CHOLECYSTECTOMY  10/04/2012   Procedure: LAPAROSCOPIC CHOLECYSTECTOMY WITH INTRAOPERATIVE CHOLANGIOGRAM;  Surgeon: Elon CHRISTELLA Pacini, MD;  Location: [facility] OR;  Service: General;  Laterality: N/A;  laparosopic cholecystectomy with cholangiogram and repair of umbilical hernia   COLONOSCOPY  08/30/11   DILATION AND CURETTAGE OF UTERUS     DOPPLER ECHOCARDIOGRAPHY     hx    ERCP  10/05/2012   Procedure: ENDOSCOPIC RETROGRADE CHOLANGIOPANCREATOGRAPHY (ERCP);  Surgeon: Oliva FORBES Boots, MD;  Location: [facility] ENDOSCOPY;  Service: Endoscopy;  Laterality: N/A;   ESOPHAGOGASTRODUODENOSCOPY  08/30/11   HERNIA REPAIR  10/02/2014   in my stomach; took it out w/gallbladder   JOINT REPLACEMENT     KELOID EXCISION Bilateral    earlobes   KNEE  ARTHROSCOPY Right 06/2007   MELANOMA EXCISION Bilateral    2nd digit right hand; bottom of left foot (06/28/2018)   REVERSE SHOULDER ARTHROPLASTY Right 10/26/2022   Procedure: REVERSE SHOULDER ARTHROPLASTY;  Surgeon: Melita Drivers, MD;  Location: [facility] ORS;  Service: Orthopedics;  Laterality: Right;    SHOULDER ARTHROSCOPY Right 06/2011   SKIN GRAFT Right    related to melanoma restricting my finger bending   TOTAL HIP ARTHROPLASTY Right 06/28/2018   TOTAL HIP ARTHROPLASTY Right 06/28/2018   Procedure: RIGHT TOTAL HIP ARTHROPLASTY ANTERIOR APPROACH;  Surgeon: Liam,  Dempsey, MD;  Location: [facility] OR;  Service: Orthopedics;  Laterality: Right;   TOTAL HIP ARTHROPLASTY Left 01/17/2021   Procedure: LEFT TOTAL HIP ARTHROPLASTY ANTERIOR APPROACH;  Surgeon: Liam Dempsey, MD;  Location: [facility] ORS;  Service: Orthopedics;  Laterality: Left;   TOTAL KNEE ARTHROPLASTY Right 08/2008   TUBAL LIGATION      Family History  Problem Relation Age of Onset   Colon polyps Mother    Breast cancer Sister    Dementia Sister    Sleep apnea Daughter     Social History   Social History Narrative   Patient lives at home with her father and he is blind. Patient is retired. Patient is widowed and has two children.   Right handed.   Caffeine - None     Review of Systems General: Denies fevers, chills, weight loss CV: Denies chest pain, shortness of breath, palpitations Skin: Painful keloids on the upper back and posterior right shoulder  Physical Exam    11/28/2023    1:32 PM 10/12/2023    8:38 AM 04/09/2023    4:42 PM  Vitals with BMI  Height 5' 5 5' 5   Weight 248 lbs 242 lbs 2 oz   BMI 41.27 40.29   Systolic 158 140 860  Diastolic 72 64 78  Pulse 54 52     General:  No acute distress,  Alert and oriented, Non-Toxic, Normal speech and affect Integument: Patient has 2 keloids as noted.  There is very little laxity around the keloids.  The keloids are painful to palpation and manipulation. Mammogram:  August 2024 BI-RADS 1 Assessment/Plan Keloids: Patient has 2 moderately large keloids 1 on the upper back and 1 on the posterior right shoulder.  She would like to have these removed.  We discussed removal at length today.  Removal of the keloids themselves is not difficult the difficulty lies in the closure.  In this area there is very little excess tissue and very little laxity closure in both areas will require wide undermining and possibly use of a tissue replacement matrix.  We have discussed the replacement matrix at length as this may be prohibited with their religion.  Ultimately they felt that it would not be a problem.  She has already seen Dr. Izell and radiation oncology and is agreeable to post procedure radiation therapy.  She understands that there is a risk of recurrence regardless of the post procedure adjuvant treatment.  She understands that I may not be able to close the entire wound hence the need for the tissue replacement matrix.  We did discuss the risks of bleeding and infection.  All questions were answered to her satisfaction.  Photographs were obtained today with her consent.  Will schedule her for surgery at her request.  Will need to coordinate with radiation oncology and will need to get cardiology clearance.  Ashley Hanson 11/28/2023, 3:00 PM

## 2023-11-29 ENCOUNTER — Telehealth: Payer: Self-pay | Admitting: *Deleted

## 2023-11-29 NOTE — Telephone Encounter (Signed)
 Spoke with pts daughter "[name]" (ok per dpr) pt scheduled to see Joni Reining, NP on 12/13/23 for preop clearance

## 2023-11-29 NOTE — Telephone Encounter (Signed)
 Faxed Request for Surgical Clearance to Dr. Rachelle Hora Croitoru-(Cardiology) for keloid excision on the back.  Confirmation received.    Waiting on respond from Cardiology.//AB/CMA

## 2023-12-11 NOTE — Progress Notes (Unsigned)
Cardiology Office Note:  .   Date:  12/13/2023  ID:  Ashley Hanson, DOB [birth year], MRN [id number] PCP: Melida Quitter, MD  [facility] Providers Cardiologist:  Thurmon Fair, MD }   History of Present Illness: .   Ashley Hanson is a 83 y.o. female with a longstanding history of systemic hypertension and more recent development of dementia. She continues to wear compression bandages. She remains quite sedentary. She is taking 4 different antihypertensive medications including beta-blocker, calcium channel blocker, ARB and thiazide diuretic.   She is here for preoperative cardiac clearance in order to have keloid scars removed from her upper back and middle back by Dr. Weyman Croon, plastic surgeon, on February 05, 2024.  These are causing her a good bit of pain and not allowing her to sleep on her back because of this.  She extends wishes to proceed with excisions.  She denies any chest pain but does have chronic musculoskeletal pain, pain in her lower extremities from a lymphedema, chronic pain in her knees.  This is severely limiting any type of activity.  She uses a wheelchair for any long distances and a cane with in her home.  She does very little activity and mostly is in a recliner.  She has been medically compliant.  She occasionally feels some palpitations but not sustained.  She stays on pain medication to help her manage her symptoms, she denies any dizziness or daytime somnolence associated.  She states her blood pressures been predominantly controlled.  ROS: As above otherwise negative  Studies Reviewed: .      Echocardiogram 12/26/2018 1. The left ventricle has normal systolic function of 55-60%. The cavity  size was normal. There is no increased left ventricular wall thickness.  Echo evidence of normal diastolic relaxation.   2. The right ventricle has normal systolic function. The cavity was  normal. There is no increase in right ventricular wall thickness. Right   ventricular systolic pressure could not be assessed.   3. Left atrial size was moderately dilated.   4. The mitral valve is normal in structure. There is mild mitral annular  calcification present.   5. The tricuspid valve is normal in structure.   6. The aortic valve is normal in structure.   7. The pulmonic valve was normal in structure.    EKG: Sinus rhythm with frequent PACs, heart rate of 75 bpm with LVH.  A good bit of artifact is seen in computer has labeled as atrial fibrillation which is incorrect.  Physical Exam:   VS:  BP (!) 148/76   Pulse 75   Ht 5\' 5"  (1.651 m)   Wt 249 lb (112.9 kg)   SpO2 97%   BMI 41.44 kg/m    Wt Readings from Last 3 Encounters:  12/13/23 249 lb (112.9 kg)  11/28/23 248 lb (112.5 kg)  10/12/23 242 lb 2 oz (109.8 kg)    GEN: Well nourished, well developed in no acute distress, morbidly obese NECK: No JVD; No carotid bruits CARDIAC: IRRR, no murmurs, rubs, gallops RESPIRATORY:  Clear to auscultation without rales, wheezing or rhonchi  ABDOMEN: Soft, non-tender, non-distended EXTREMITIES: Bilateral lymphedema, legs are wrapped and she is wearing walking cast shoes.  Unable to palpate pulses.  ASSESSMENT AND PLAN: .    1`.  Preoperative clearance: According to the Revised Cardiac Risk Index (RCRI), her Perioperative Risk of Major Cardiac Event is (%): 0.4  Her Functional Capacity in METs is: 3.97 according to the [facility] Activity  Status Index (DASI).  Functional capacity is due to scoliosis, bilateral knee pain and lymphedema.  Not related to any cardiac deficiencies.  Per office protocol, if patient is without any new symptoms or concerns at the time of their virtual visit, he/she may hold ASA for 7 days prior to procedure. Please resume ASA as soon as possible postprocedure, at the discretion of the surgeon.       Therefore, based on ACC/AHA guidelines, patient would be at acceptable risk for the planned procedure without further cardiovascular  testing. I will route this recommendation to the requesting party via Epic fax function.    2.  Hypertension: Blood pressure is slightly elevated today.  She continues on hydrochlorothiazide 25 mg losartan 100 mg daily propranolol 80 mg twice daily.  She should continue to take these medicines perioperatively.  3.  Hypercholesterolemia: The patient is followed by primary care provider for labs.  She remains on rosuvastatin 20 mg daily which she takes at bedtime.  4.  Chronic lymphedema: Followed by vein and vascular.  Legs are wrapped.  She remains in a lot of pain and does use a good bit of pain medication to help with control.   Signed, Bettey Mare. Liborio Nixon, ANP, AACC

## 2023-12-13 ENCOUNTER — Ambulatory Visit: Payer: Medicare Other | Attending: Adult Health | Admitting: Adult Health

## 2023-12-13 ENCOUNTER — Encounter: Payer: Self-pay | Admitting: Adult Health

## 2023-12-13 VITALS — BP 148/76 | HR 75 | Ht 65.0 in | Wt 249.0 lb

## 2023-12-13 DIAGNOSIS — I890 Lymphedema, not elsewhere classified: Secondary | ICD-10-CM | POA: Insufficient documentation

## 2023-12-13 DIAGNOSIS — E7800 Pure hypercholesterolemia, unspecified: Secondary | ICD-10-CM | POA: Insufficient documentation

## 2023-12-13 DIAGNOSIS — Z01818 Encounter for other preprocedural examination: Secondary | ICD-10-CM | POA: Diagnosis present

## 2023-12-13 DIAGNOSIS — I10 Essential (primary) hypertension: Secondary | ICD-10-CM | POA: Insufficient documentation

## 2023-12-13 NOTE — Patient Instructions (Signed)
Medication Instructions:  No changes *If you need a refill on your cardiac medications before your next appointment, please call your pharmacy*   Lab Work: No Labs If you have labs (blood work) drawn today and your tests are completely normal, you will receive your results only by: MyChart Message (if you have MyChart) OR A paper copy in the mail If you have any lab test that is abnormal or we need to change your treatment, we will call you to review the results.   Testing/Procedures: No Testing   Follow-Up: At [facility], you and your health needs are our priority.  As part of our continuing mission to provide you with exceptional heart care, we have created designated Provider Care Teams.  These Care Teams include your primary Cardiologist (physician) and Advanced Practice Providers (APPs -  Physician Assistants and Nurse Practitioners) who all work together to provide you with the care you need, when you need it.  We recommend signing up for the patient portal called "MyChart".  Sign up information is provided on this After Visit Summary.  MyChart is used to connect with patients for Virtual Visits (Telemedicine).  Patients are able to view lab/test results, encounter notes, upcoming appointments, etc.  Non-urgent messages can be sent to your provider as well.   To learn more about what you can do with MyChart, go to [email or web address].    Your next appointment:   1 year(s)  Provider:   Thurmon Fair, MD

## 2024-01-16 LAB — LAB REPORT - SCANNED: EGFR: 68.7

## 2024-01-17 ENCOUNTER — Encounter: Payer: Self-pay | Admitting: Physician Assistant

## 2024-01-17 ENCOUNTER — Ambulatory Visit (INDEPENDENT_AMBULATORY_CARE_PROVIDER_SITE_OTHER): Payer: Medicare Other | Admitting: Physician Assistant

## 2024-01-17 VITALS — BP 133/76 | HR 54 | Ht 65.5 in | Wt 242.0 lb

## 2024-01-17 DIAGNOSIS — L910 Hypertrophic scar: Secondary | ICD-10-CM

## 2024-01-17 MED ORDER — ONDANSETRON 4 MG PO TBDP: 4.0000 mg | ORAL_TABLET | Freq: Three times a day (TID) | ORAL | 0 refills | Status: AC | PRN

## 2024-01-17 MED ORDER — OXYCODONE HCL 5 MG PO TABS: 5.0000 mg | ORAL_TABLET | Freq: Four times a day (QID) | ORAL | 0 refills | Status: AC | PRN

## 2024-01-17 NOTE — Progress Notes (Signed)
 Patient ID: Ashley Hanson, female    DOB: 1941/10/29, 83 y.o.   MRN: [id number]  Chief Complaint  Patient presents with   Pre-op Exam      ICD-10-CM   1. Keloid scar  L91.0        History of Present Illness: Ashley Hanson is a 83 y.o.  female  with a history of keloids.  She presents for preoperative evaluation for upcoming procedure, keloid excision from posterior right shoulder and upper back with complex closure and possible use of myriad, scheduled for 02/05/2024 with Dr.  Ladona Ridgel .  The patient has not had problems with anesthesia except PONV after only one of her numerous surgeries.  She is accompanied by her daughter [name] who will be assisting with her postoperative recovery.  She denies any personal or family history of blood clots or clotting disorder.  She does endorse a history of breast cancer that has been treated.  She reports latex allergy as well as tape sensitivity.  Discussed Dermabond and Mepilex bandages which she states would be okay.  Patient endorses history of rapid drug metabolism and difficulty with postoperative pain.  She states that she had significant pain even after a small finger lesion excision several years ago.  She understands that she will likely have some postoperative discomfort from the keloid excision, but is agreeable to proceed.  She understands that we will not manage pain chronically and she will only get a single prescription for postoperative pain control.  She ambulates with assistance of walker or cane.  She understands to hold the ASA 81 mg 7 days prior to surgery and restart approximately 40 to 72 hours postoperatively depending on whether not she is having any incisional bleeding.  She tells me that she recently had labs obtained with her primary care provider at [facility].  She endorses a low platelet count, but cannot tell me exactly what her number was.  She also is unsure if she has anemia, but reports that she is  asymptomatic.  She has a follow-up appointment with her PCP next week and asked patient to have PCP fax their notes/pertinent labs to [place] given her upcoming surgery.  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  Patient and daughter voiced understanding and are agreeable.  Summary of Previous Visit: Patient met with Dr. Ladona Ridgel for initial consult 11/28/2023.  At that time, discussed excision of multiple keloids.  There is one located on the upper mid back and another on the right posterior shoulder.  They were the result of skin lesion excisions.  PMH Significant for: HTN, HLD, CAD on ASA 81 mg but with low risk NST 2019, chronic lymphedema, TIA, dementia, thyroid disorder.   Past Medical History: Allergies: Allergies  Allergen Reactions   Adhesive [Tape] Dermatitis    Severe irritation of skin   Plavix [Clopidogrel Bisulfate] Itching and Other (See Comments)    Causes migraines. Itching all over the body.   Warfarin And Related Other (See Comments)    Blood thinners - cause migraines in patient.   Other     Refusal of blood   Apixaban Rash    Current Medications:  Current Outpatient Medications:    aspirin EC 81 MG tablet, Take 1 tablet (81 mg total) by mouth 2 (two) times daily. (Patient taking differently: Take 81 mg by mouth daily.), Disp: 60 tablet, Rfl: 0   Calcium Carbonate-Vit D-Min (CALCIUM 1200 PO), Take 1  tablet by mouth daily in the afternoon., Disp: , Rfl:    Cholecalciferol (VITAMIN D-3) 1000 units CAPS, Take 1,000 Units by mouth daily., Disp: , Rfl:    Cyanocobalamin (B-12 PO), Take 1 tablet by mouth daily., Disp: , Rfl:    diltiazem (CARDIZEM CD) 120 MG 24 hr capsule, Take 120 mg by mouth daily., Disp: , Rfl:    donepezil (ARICEPT) 10 MG tablet, Take 1 tablet (10 mg total) by mouth every morning., Disp: 90 tablet, Rfl: 3   gabapentin (NEURONTIN) 600 MG tablet, Take 600 mg by mouth 3 (three) times daily., Disp: , Rfl:     hydrochlorothiazide (HYDRODIURIL) 25 MG tablet, Take 25 mg by mouth daily., Disp: , Rfl:    hypromellose (GENTEAL) 0.3 % GEL ophthalmic ointment, Place 1 application into both eyes at bedtime., Disp: , Rfl:    levothyroxine (SYNTHROID) 100 MCG tablet, Take 100 mcg by mouth daily before breakfast., Disp: , Rfl:    levothyroxine (SYNTHROID, LEVOTHROID) 50 MCG tablet, Take 50 mcg by mouth daily before breakfast., Disp: , Rfl:    losartan (COZAAR) 100 MG tablet, Take 100 mg by mouth daily. , Disp: , Rfl:    memantine (NAMENDA) 10 MG tablet, Take 1 tablet (10 mg total) by mouth 2 (two) times daily., Disp: 180 tablet, Rfl: 3   Multiple Vitamin (MULTIVITAMIN WITH MINERALS) TABS tablet, Take 1 tablet by mouth daily., Disp: , Rfl:    Multiple Vitamins-Minerals (PRESERVISION AREDS PO), Take 1 capsule by mouth daily in the afternoon., Disp: , Rfl:    ondansetron (ZOFRAN-ODT) 4 MG disintegrating tablet, Take 1 tablet (4 mg total) by mouth every 8 (eight) hours as needed for nausea or vomiting., Disp: 20 tablet, Rfl: 0   oxyCODONE (ROXICODONE) 5 MG immediate release tablet, Take 1 tablet (5 mg total) by mouth every 6 (six) hours as needed for up to 5 days for severe pain (pain score 7-10)., Disp: 20 tablet, Rfl: 0   propranolol (INDERAL) 80 MG tablet, Take 80 mg by mouth 2 (two) times daily., Disp: , Rfl:    rosuvastatin (CRESTOR) 20 MG tablet, Take 20 mg by mouth at bedtime., Disp: , Rfl:    sodium chloride (MURO 128) 5 % ophthalmic solution, Place 1 drop into both eyes 4 (four) times daily. , Disp: , Rfl:    tiZANidine (ZANAFLEX) 2 MG tablet, Take 1 tablet (2 mg total) by mouth every 6 (six) hours as needed., Disp: 30 tablet, Rfl: 0   trolamine salicylate (BLUE-EMU HEMP) 10 % cream, Apply 1 Application topically as needed for muscle pain., Disp: , Rfl:    Turmeric 500 MG TABS, Take 500 mg by mouth daily., Disp: , Rfl:   Past Medical Problems: Past Medical History:  Diagnosis Date   Anginal pain (HCC)     sees Dr Alanda Amass; reportedly negative stress/echo 2012   Arthritis    "hands, elbows, shoulders, and back" (06/28/2018)   Breast cancer, left breast (HCC)    Carpal tunnel syndrome    bilaterally   Chronic lower back pain    Complication of anesthesia    "difficutly waking up and hypothermia and stopped breathing"   Confusion    Dementia (HCC)    "stage 4; w/both long term and short term memory loss" (06/28/2018)   Depression    Diverticulosis    Esophageal reflux    Hypertension    sees Dr. Dorma Russell J.Kareem Cathey   Hypothyroidism    IBS (irritable bowel syndrome)    IBS (irritable  bowel syndrome)    Melanoma (HCC)    2nd digit right hand; bottom of left foot" (06/28/2018)   Migraines    "stopped having them 3-4 years ago" (06/28/2018)   Myocardial infarction [facility])    "a mini one; early 2000s maybe"   Osteoporosis    PONV (postoperative nausea and vomiting)    Woke up during surgery   Refusal of blood transfusions as patient is Jehovah's Witness    Scoliosis     Past Surgical History: Past Surgical History:  Procedure Laterality Date   ANKLE SURGERY Left 10/2007   "fell at work; don't know what they did to it"   BREAST BIOPSY Left "X 2"   BREAST LUMPECTOMY Left    BREAST SURGERY Left    "removed scar tissue"   CARDIAC CATHETERIZATION  X 2   CARDIOVASCULAR STRESS TEST     CARPAL TUNNEL RELEASE Right 1995   CARPAL TUNNEL RELEASE Left    CATARACT EXTRACTION W/ INTRAOCULAR LENS  IMPLANT, BILATERAL     CHOLECYSTECTOMY  10/04/2012   Procedure: LAPAROSCOPIC CHOLECYSTECTOMY WITH INTRAOPERATIVE CHOLANGIOGRAM;  Surgeon: Ernestene Mention, MD;  Location: [facility] OR;  Service: General;  Laterality: N/A;  laparosopic cholecystectomy with cholangiogram and repair of umbilical hernia   COLONOSCOPY  08/30/11   DILATION AND CURETTAGE OF UTERUS     DOPPLER ECHOCARDIOGRAPHY     hx    ERCP  10/05/2012   Procedure: ENDOSCOPIC RETROGRADE CHOLANGIOPANCREATOGRAPHY (ERCP);  Surgeon: Petra Kuba, MD;   Location: [facility] ENDOSCOPY;  Service: Endoscopy;  Laterality: N/A;   ESOPHAGOGASTRODUODENOSCOPY  08/30/11   HERNIA REPAIR  10/02/2014   "in my stomach; took it out w/gallbladder"   JOINT REPLACEMENT     KELOID EXCISION Bilateral    "earlobes"   KNEE ARTHROSCOPY Right 06/2007   MELANOMA EXCISION Bilateral    2nd digit right hand; bottom of left foot" (06/28/2018)   REVERSE SHOULDER ARTHROPLASTY Right 10/26/2022   Procedure: REVERSE SHOULDER ARTHROPLASTY;  Surgeon: Francena Hanly, MD;  Location: [facility] ORS;  Service: Orthopedics;  Laterality: Right;    SHOULDER ARTHROSCOPY Right 06/2011   SKIN GRAFT Right    "related to melanoma restricting my finger bending"   TOTAL HIP ARTHROPLASTY Right 06/28/2018   TOTAL HIP ARTHROPLASTY Right 06/28/2018   Procedure: RIGHT TOTAL HIP ARTHROPLASTY ANTERIOR APPROACH;  Surgeon: Gean Birchwood, MD;  Location: [facility] OR;  Service: Orthopedics;  Laterality: Right;   TOTAL HIP ARTHROPLASTY Left 01/17/2021   Procedure: LEFT TOTAL HIP ARTHROPLASTY ANTERIOR APPROACH;  Surgeon: Gean Birchwood, MD;  Location: [facility] ORS;  Service: Orthopedics;  Laterality: Left;   TOTAL KNEE ARTHROPLASTY Right 08/2008   TUBAL LIGATION      Social History: Social History   Socioeconomic History   Marital status: Widowed    Spouse name: Not on file   Number of children: 2   Years of education: 12   Highest education level: Not on file  Occupational History   Occupation: Sales    Comment: Retired  Tobacco Use   Smoking status: Never   Smokeless tobacco: Never  Vaping Use   Vaping status: Never Used  Substance and Sexual Activity   Alcohol use: Not Currently   Drug use: Never   Sexual activity: Not Currently  Other Topics Concern   Not on file  Social History Narrative   Patient lives at home with her father and he is blind. Patient is retired. Patient is widowed and has two children.   Right handed.  Caffeine- None   Social Drivers of Corporate investment banker Strain: Not on  file  Food Insecurity: Not on file  Transportation Needs: Not on file  Physical Activity: Not on file  Stress: Not on file  Social Connections: Not on file  Intimate Partner Violence: Not on file    Family History: Family History  Problem Relation Age of Onset   Colon polyps Mother    Breast cancer Sister    Dementia Sister    Sleep apnea Daughter     Review of Systems: ROS Denies any recent chest pain, difficulty breathing, fevers.  Physical Exam: Vital Signs BP 133/76 (BP Location: Left Arm, Patient Position: Sitting, Cuff Size: Large)   Pulse (!) 54   Ht 5' 5.5" (1.664 m)   Wt 242 lb (109.8 kg)   SpO2 96%   BMI 39.66 kg/m   Physical Exam Constitutional:      General: Not in acute distress.    Appearance: Normal appearance. Not ill-appearing.  HENT:     Head: Normocephalic and atraumatic.  Eyes:     Pupils: Pupils are equal, round. Cardiovascular:     Rate and Rhythm: Normal rate.    Pulses: Normal pulses.  Pulmonary:     Effort: No respiratory distress or increased work of breathing.  Speaks in full sentences. Abdominal:     General: Abdomen is flat. No distension.   Musculoskeletal: Normal range of motion.  Bilateral lower extremity lymphedema. Skin:    General: Skin is warm and dry.     Findings: No erythema or rash.  Neurological:     Mental Status: Alert and oriented to person, place, and time.  Psychiatric:        Mood and Affect: Mood normal.        Behavior: Behavior normal.    Assessment/Plan: The patient is scheduled for keloid excision from posterior right shoulder and back with Dr.  Ladona Ridgel .  Risks, benefits, and alternatives of procedure discussed, questions answered and consent obtained.    Smoking Status: Non-smoker.  Caprini Score: 9; Risk Factors include: Age, BMI greater than 25, history of breast cancer, swollen legs, and length of planned surgery. Recommendation for mechanical and possibly pharmacological prophylaxis.  Will discuss  with surgeon and prescribe Lovenox if deemed indicated.  Regardless, we will encourage early ambulation and have patient restart ASA 81 mg 48 to 72 hours postoperatively.  Pictures obtained: 11/28/2023.  Post-op Rx sent to pharmacy: Oxycodone and Zofran.  Patient was provided with the General Surgical Risk consent document and Pain Medication Agreement prior to their appointment.  They had adequate time to read through the risk consent documents and Pain Medication Agreement. We also discussed them in person together during this preop appointment. All of their questions were answered to their satisfaction.  Recommended calling if they have any further questions.  Risk consent form and Pain Medication Agreement to be scanned into patient's chart.    Electronically signed by: Evelena Leyden, PA-C 01/17/2024 4:03 PM

## 2024-01-17 NOTE — H&P (View-Only) (Signed)
 Patient ID: Ashley Hanson, female    DOB: 1941/10/29, 83 y.o.   MRN: [id number]  Chief Complaint  Patient presents with   Pre-op Exam      ICD-10-CM   1. Keloid scar  L91.0        History of Present Illness: Myeisha Hanson is a 83 y.o.  female  with a history of keloids.  She presents for preoperative evaluation for upcoming procedure, keloid excision from posterior right shoulder and upper back with complex closure and possible use of myriad, scheduled for 02/05/2024 with Dr.  Ladona Ridgel .  The patient has not had problems with anesthesia except PONV after only one of her numerous surgeries.  She is accompanied by her daughter Ashley Hanson who will be assisting with her postoperative recovery.  She denies any personal or family history of blood clots or clotting disorder.  She does endorse a history of breast cancer that has been treated.  She reports latex allergy as well as tape sensitivity.  Discussed Dermabond and Mepilex bandages which she states would be okay.  Patient endorses history of rapid drug metabolism and difficulty with postoperative pain.  She states that she had significant pain even after a small finger lesion excision several years ago.  She understands that she will likely have some postoperative discomfort from the keloid excision, but is agreeable to proceed.  She understands that we will not manage pain chronically and she will only get a single prescription for postoperative pain control.  She ambulates with assistance of walker or cane.  She understands to hold the ASA 81 mg 7 days prior to surgery and restart approximately 40 to 72 hours postoperatively depending on whether not she is having any incisional bleeding.  She tells me that she recently had labs obtained with her primary care provider at [facility].  She endorses a low platelet count, but cannot tell me exactly what her number was.  She also is unsure if she has anemia, but reports that she is  asymptomatic.  She has a follow-up appointment with her PCP next week and asked patient to have PCP fax their notes/pertinent labs to [place] given her upcoming surgery.  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  Patient and daughter voiced understanding and are agreeable.  Summary of Previous Visit: Patient met with Dr. Ladona Ridgel for initial consult 11/28/2023.  At that time, discussed excision of multiple keloids.  There is one located on the upper mid back and another on the right posterior shoulder.  They were the result of skin lesion excisions.  PMH Significant for: HTN, HLD, CAD on ASA 81 mg but with low risk NST 2019, chronic lymphedema, TIA, dementia, thyroid disorder.   Past Medical History: Allergies: Allergies  Allergen Reactions   Adhesive [Tape] Dermatitis    Severe irritation of skin   Plavix [Clopidogrel Bisulfate] Itching and Other (See Comments)    Causes migraines. Itching all over the body.   Warfarin And Related Other (See Comments)    Blood thinners - cause migraines in patient.   Other     Refusal of blood   Apixaban Rash    Current Medications:  Current Outpatient Medications:    aspirin EC 81 MG tablet, Take 1 tablet (81 mg total) by mouth 2 (two) times daily. (Patient taking differently: Take 81 mg by mouth daily.), Disp: 60 tablet, Rfl: 0   Calcium Carbonate-Vit D-Min (CALCIUM 1200 PO), Take 1  tablet by mouth daily in the afternoon., Disp: , Rfl:    Cholecalciferol (VITAMIN D-3) 1000 units CAPS, Take 1,000 Units by mouth daily., Disp: , Rfl:    Cyanocobalamin (B-12 PO), Take 1 tablet by mouth daily., Disp: , Rfl:    diltiazem (CARDIZEM CD) 120 MG 24 hr capsule, Take 120 mg by mouth daily., Disp: , Rfl:    donepezil (ARICEPT) 10 MG tablet, Take 1 tablet (10 mg total) by mouth every morning., Disp: 90 tablet, Rfl: 3   gabapentin (NEURONTIN) 600 MG tablet, Take 600 mg by mouth 3 (three) times daily., Disp: , Rfl:     hydrochlorothiazide (HYDRODIURIL) 25 MG tablet, Take 25 mg by mouth daily., Disp: , Rfl:    hypromellose (GENTEAL) 0.3 % GEL ophthalmic ointment, Place 1 application into both eyes at bedtime., Disp: , Rfl:    levothyroxine (SYNTHROID) 100 MCG tablet, Take 100 mcg by mouth daily before breakfast., Disp: , Rfl:    levothyroxine (SYNTHROID, LEVOTHROID) 50 MCG tablet, Take 50 mcg by mouth daily before breakfast., Disp: , Rfl:    losartan (COZAAR) 100 MG tablet, Take 100 mg by mouth daily. , Disp: , Rfl:    memantine (NAMENDA) 10 MG tablet, Take 1 tablet (10 mg total) by mouth 2 (two) times daily., Disp: 180 tablet, Rfl: 3   Multiple Vitamin (MULTIVITAMIN WITH MINERALS) TABS tablet, Take 1 tablet by mouth daily., Disp: , Rfl:    Multiple Vitamins-Minerals (PRESERVISION AREDS PO), Take 1 capsule by mouth daily in the afternoon., Disp: , Rfl:    ondansetron (ZOFRAN-ODT) 4 MG disintegrating tablet, Take 1 tablet (4 mg total) by mouth every 8 (eight) hours as needed for nausea or vomiting., Disp: 20 tablet, Rfl: 0   oxyCODONE (ROXICODONE) 5 MG immediate release tablet, Take 1 tablet (5 mg total) by mouth every 6 (six) hours as needed for up to 5 days for severe pain (pain score 7-10)., Disp: 20 tablet, Rfl: 0   propranolol (INDERAL) 80 MG tablet, Take 80 mg by mouth 2 (two) times daily., Disp: , Rfl:    rosuvastatin (CRESTOR) 20 MG tablet, Take 20 mg by mouth at bedtime., Disp: , Rfl:    sodium chloride (MURO 128) 5 % ophthalmic solution, Place 1 drop into both eyes 4 (four) times daily. , Disp: , Rfl:    tiZANidine (ZANAFLEX) 2 MG tablet, Take 1 tablet (2 mg total) by mouth every 6 (six) hours as needed., Disp: 30 tablet, Rfl: 0   trolamine salicylate (BLUE-EMU HEMP) 10 % cream, Apply 1 Application topically as needed for muscle pain., Disp: , Rfl:    Turmeric 500 MG TABS, Take 500 mg by mouth daily., Disp: , Rfl:   Past Medical Problems: Past Medical History:  Diagnosis Date   Anginal pain (HCC)     sees Dr Alanda Amass; reportedly negative stress/echo 2012   Arthritis    "hands, elbows, shoulders, and back" (06/28/2018)   Breast cancer, left breast (HCC)    Carpal tunnel syndrome    bilaterally   Chronic lower back pain    Complication of anesthesia    "difficutly waking up and hypothermia and stopped breathing"   Confusion    Dementia (HCC)    "stage 4; w/both long term and short term memory loss" (06/28/2018)   Depression    Diverticulosis    Esophageal reflux    Hypertension    sees Dr. Dorma Russell J.Kareem Cathey   Hypothyroidism    IBS (irritable bowel syndrome)    IBS (irritable  bowel syndrome)    Melanoma (HCC)    2nd digit right hand; bottom of left foot" (06/28/2018)   Migraines    "stopped having them 3-4 years ago" (06/28/2018)   Myocardial infarction [facility])    "a mini one; early 2000s maybe"   Osteoporosis    PONV (postoperative nausea and vomiting)    Woke up during surgery   Refusal of blood transfusions as patient is Jehovah's Witness    Scoliosis     Past Surgical History: Past Surgical History:  Procedure Laterality Date   ANKLE SURGERY Left 10/2007   "fell at work; don't know what they did to it"   BREAST BIOPSY Left "X 2"   BREAST LUMPECTOMY Left    BREAST SURGERY Left    "removed scar tissue"   CARDIAC CATHETERIZATION  X 2   CARDIOVASCULAR STRESS TEST     CARPAL TUNNEL RELEASE Right 1995   CARPAL TUNNEL RELEASE Left    CATARACT EXTRACTION W/ INTRAOCULAR LENS  IMPLANT, BILATERAL     CHOLECYSTECTOMY  10/04/2012   Procedure: LAPAROSCOPIC CHOLECYSTECTOMY WITH INTRAOPERATIVE CHOLANGIOGRAM;  Surgeon: Ernestene Mention, MD;  Location: [facility] OR;  Service: General;  Laterality: N/A;  laparosopic cholecystectomy with cholangiogram and repair of umbilical hernia   COLONOSCOPY  08/30/11   DILATION AND CURETTAGE OF UTERUS     DOPPLER ECHOCARDIOGRAPHY     hx    ERCP  10/05/2012   Procedure: ENDOSCOPIC RETROGRADE CHOLANGIOPANCREATOGRAPHY (ERCP);  Surgeon: Petra Kuba, MD;   Location: [facility] ENDOSCOPY;  Service: Endoscopy;  Laterality: N/A;   ESOPHAGOGASTRODUODENOSCOPY  08/30/11   HERNIA REPAIR  10/02/2014   "in my stomach; took it out w/gallbladder"   JOINT REPLACEMENT     KELOID EXCISION Bilateral    "earlobes"   KNEE ARTHROSCOPY Right 06/2007   MELANOMA EXCISION Bilateral    2nd digit right hand; bottom of left foot" (06/28/2018)   REVERSE SHOULDER ARTHROPLASTY Right 10/26/2022   Procedure: REVERSE SHOULDER ARTHROPLASTY;  Surgeon: Francena Hanly, MD;  Location: [facility] ORS;  Service: Orthopedics;  Laterality: Right;    SHOULDER ARTHROSCOPY Right 06/2011   SKIN GRAFT Right    "related to melanoma restricting my finger bending"   TOTAL HIP ARTHROPLASTY Right 06/28/2018   TOTAL HIP ARTHROPLASTY Right 06/28/2018   Procedure: RIGHT TOTAL HIP ARTHROPLASTY ANTERIOR APPROACH;  Surgeon: Gean Birchwood, MD;  Location: [facility] OR;  Service: Orthopedics;  Laterality: Right;   TOTAL HIP ARTHROPLASTY Left 01/17/2021   Procedure: LEFT TOTAL HIP ARTHROPLASTY ANTERIOR APPROACH;  Surgeon: Gean Birchwood, MD;  Location: [facility] ORS;  Service: Orthopedics;  Laterality: Left;   TOTAL KNEE ARTHROPLASTY Right 08/2008   TUBAL LIGATION      Social History: Social History   Socioeconomic History   Marital status: Widowed    Spouse name: Not on file   Number of children: 2   Years of education: 12   Highest education level: Not on file  Occupational History   Occupation: Sales    Comment: Retired  Tobacco Use   Smoking status: Never   Smokeless tobacco: Never  Vaping Use   Vaping status: Never Used  Substance and Sexual Activity   Alcohol use: Not Currently   Drug use: Never   Sexual activity: Not Currently  Other Topics Concern   Not on file  Social History Narrative   Patient lives at home with her father and he is blind. Patient is retired. Patient is widowed and has two children.   Right handed.  Caffeine- None   Social Drivers of Corporate investment banker Strain: Not on  file  Food Insecurity: Not on file  Transportation Needs: Not on file  Physical Activity: Not on file  Stress: Not on file  Social Connections: Not on file  Intimate Partner Violence: Not on file    Family History: Family History  Problem Relation Age of Onset   Colon polyps Mother    Breast cancer Sister    Dementia Sister    Sleep apnea Daughter     Review of Systems: ROS Denies any recent chest pain, difficulty breathing, fevers.  Physical Exam: Vital Signs BP 133/76 (BP Location: Left Arm, Patient Position: Sitting, Cuff Size: Large)   Pulse (!) 54   Ht 5' 5.5" (1.664 m)   Wt 242 lb (109.8 kg)   SpO2 96%   BMI 39.66 kg/m   Physical Exam Constitutional:      General: Not in acute distress.    Appearance: Normal appearance. Not ill-appearing.  HENT:     Head: Normocephalic and atraumatic.  Eyes:     Pupils: Pupils are equal, round. Cardiovascular:     Rate and Rhythm: Normal rate.    Pulses: Normal pulses.  Pulmonary:     Effort: No respiratory distress or increased work of breathing.  Speaks in full sentences. Abdominal:     General: Abdomen is flat. No distension.   Musculoskeletal: Normal range of motion.  Bilateral lower extremity lymphedema. Skin:    General: Skin is warm and dry.     Findings: No erythema or rash.  Neurological:     Mental Status: Alert and oriented to person, place, and time.  Psychiatric:        Mood and Affect: Mood normal.        Behavior: Behavior normal.    Assessment/Plan: The patient is scheduled for keloid excision from posterior right shoulder and back with Dr.  Ladona Ridgel .  Risks, benefits, and alternatives of procedure discussed, questions answered and consent obtained.    Smoking Status: Non-smoker.  Caprini Score: 9; Risk Factors include: Age, BMI greater than 25, history of breast cancer, swollen legs, and length of planned surgery. Recommendation for mechanical and possibly pharmacological prophylaxis.  Will discuss  with surgeon and prescribe Lovenox if deemed indicated.  Regardless, we will encourage early ambulation and have patient restart ASA 81 mg 48 to 72 hours postoperatively.  Pictures obtained: 11/28/2023.  Post-op Rx sent to pharmacy: Oxycodone and Zofran.  Patient was provided with the General Surgical Risk consent document and Pain Medication Agreement prior to their appointment.  They had adequate time to read through the risk consent documents and Pain Medication Agreement. We also discussed them in person together during this preop appointment. All of their questions were answered to their satisfaction.  Recommended calling if they have any further questions.  Risk consent form and Pain Medication Agreement to be scanned into patient's chart.    Electronically signed by: Evelena Leyden, PA-C 01/17/2024 4:03 PM

## 2024-01-31 ENCOUNTER — Encounter (HOSPITAL_BASED_OUTPATIENT_CLINIC_OR_DEPARTMENT_OTHER)
Admission: RE | Admit: 2024-01-31 | Discharge: 2024-01-31 | Disposition: A | Discharge status: Home / Self Care | Source: Ambulatory Visit | Attending: Plastic Surgery | Admitting: Plastic Surgery

## 2024-01-31 DIAGNOSIS — Z01812 Encounter for preprocedural laboratory examination: Secondary | ICD-10-CM | POA: Insufficient documentation

## 2024-01-31 LAB — BASIC METABOLIC PANEL
Anion gap: 11 (ref 5–15)
BUN: 21 mg/dL (ref 8–23)
CO2: 22 mmol/L (ref 22–32)
Calcium: 8.9 mg/dL (ref 8.9–10.3)
Chloride: 107 mmol/L (ref 98–111)
Creatinine, Ser: 0.72 mg/dL (ref 0.44–1.00)
GFR, Estimated: >60 mL/min (ref >60)
Glucose, Bld: 109 mg/dL — ABNORMAL HIGH (ref 70–99)
Potassium: 4.1 mmol/L (ref 3.5–5.1)
Sodium: 140 mmol/L (ref 135–145)

## 2024-01-31 MED ORDER — CHLORHEXIDINE GLUCONATE CLOTH 2 % EX PADS: 6.0000 | MEDICATED_PAD | Freq: Once | CUTANEOUS | Status: DC

## 2024-02-01 ENCOUNTER — Telehealth: Payer: Self-pay | Admitting: Radiation Oncology

## 2024-02-01 NOTE — Telephone Encounter (Signed)
 3/14 @ 4:15 pm patient's daughter called to reschedule treatment times on 3/19 and 3/21 to 4:30 pm.  Send Email to Support RTT and copied L3 machine, so they are aware.

## 2024-02-04 NOTE — Anesthesia Preprocedure Evaluation (Signed)
 Anesthesia Evaluation  Patient identified by MRN, date of birth, ID band Patient awake    Reviewed: Allergy & Precautions, H&P , NPO status , Patient's Chart, lab work & pertinent test results  History of Anesthesia Complications (+) PONV and history of anesthetic complications  Airway Mallampati: II  TM Distance: >3 FB Neck ROM: Full    Dental  (+) Edentulous Upper, Edentulous Lower   Pulmonary neg pulmonary ROS   Pulmonary exam normal breath sounds clear to auscultation       Cardiovascular hypertension, + CAD and + Past MI  Normal cardiovascular exam Rhythm:Regular Rate:Normal     Neuro/Psych  Headaches PSYCHIATRIC DISORDERS  Depression   Dementia TIA   GI/Hepatic Neg liver ROS,GERD  ,,  Endo/Other  Hypothyroidism    Renal/GU negative Renal ROS  negative genitourinary   Musculoskeletal  (+) Arthritis ,    Abdominal   Peds negative pediatric ROS (+)  Hematology negative hematology ROS (+)   Anesthesia Other Findings   Reproductive/Obstetrics negative OB ROS                             Anesthesia Physical Anesthesia Plan  ASA: 3  Anesthesia Plan: General   Post-op Pain Management: Tylenol PO (pre-op)*   Induction: Intravenous  PONV Risk Score and Plan: 4 or greater and Ondansetron, Dexamethasone and Treatment may vary due to age or medical condition  Airway Management Planned: Oral ETT  Additional Equipment:   Intra-op Plan:   Post-operative Plan: Extubation in OR  Informed Consent: I have reviewed the patients History and Physical, chart, labs and discussed the procedure including the risks, benefits and alternatives for the proposed anesthesia with the patient or authorized representative who has indicated his/her understanding and acceptance.     Dental advisory given  Plan Discussed with: CRNA  Anesthesia Plan Comments: (Refusal of all blood products)         Anesthesia Quick Evaluation

## 2024-02-05 ENCOUNTER — Encounter (HOSPITAL_BASED_OUTPATIENT_CLINIC_OR_DEPARTMENT_OTHER): Payer: Self-pay | Admitting: Plastic Surgery

## 2024-02-05 ENCOUNTER — Other Ambulatory Visit: Payer: Self-pay

## 2024-02-05 ENCOUNTER — Telehealth: Payer: Self-pay

## 2024-02-05 ENCOUNTER — Ambulatory Visit (HOSPITAL_BASED_OUTPATIENT_CLINIC_OR_DEPARTMENT_OTHER): Payer: Self-pay | Admitting: Anesthesiology

## 2024-02-05 ENCOUNTER — Ambulatory Visit
Admission: RE | Admit: 2024-02-05 | Discharge: 2024-02-05 | Disposition: A | Discharge status: Home / Self Care | Payer: Medicare Other | Source: Ambulatory Visit | Attending: Radiation Oncology | Admitting: Radiation Oncology

## 2024-02-05 ENCOUNTER — Encounter (HOSPITAL_BASED_OUTPATIENT_CLINIC_OR_DEPARTMENT_OTHER)
Admission: RE | Disposition: A | Discharge status: Home / Self Care | Payer: Self-pay | Source: Home / Self Care | Attending: Plastic Surgery

## 2024-02-05 ENCOUNTER — Ambulatory Visit (HOSPITAL_BASED_OUTPATIENT_CLINIC_OR_DEPARTMENT_OTHER)
Admission: RE | Admit: 2024-02-05 | Discharge: 2024-02-05 | Disposition: A | Discharge status: Home / Self Care | Payer: Medicare Other | Attending: Plastic Surgery | Admitting: Plastic Surgery

## 2024-02-05 DIAGNOSIS — Z510 Encounter for antineoplastic radiation therapy: Secondary | ICD-10-CM | POA: Insufficient documentation

## 2024-02-05 DIAGNOSIS — M1990 Unspecified osteoarthritis, unspecified site: Secondary | ICD-10-CM | POA: Insufficient documentation

## 2024-02-05 DIAGNOSIS — F0390 Unspecified dementia without behavioral disturbance: Secondary | ICD-10-CM | POA: Insufficient documentation

## 2024-02-05 DIAGNOSIS — L928 Other granulomatous disorders of the skin and subcutaneous tissue: Secondary | ICD-10-CM | POA: Diagnosis not present

## 2024-02-05 DIAGNOSIS — I10 Essential (primary) hypertension: Secondary | ICD-10-CM | POA: Diagnosis not present

## 2024-02-05 DIAGNOSIS — L910 Hypertrophic scar: Secondary | ICD-10-CM

## 2024-02-05 DIAGNOSIS — Z79899 Other long term (current) drug therapy: Secondary | ICD-10-CM | POA: Insufficient documentation

## 2024-02-05 DIAGNOSIS — I890 Lymphedema, not elsewhere classified: Secondary | ICD-10-CM | POA: Diagnosis not present

## 2024-02-05 DIAGNOSIS — K219 Gastro-esophageal reflux disease without esophagitis: Secondary | ICD-10-CM | POA: Insufficient documentation

## 2024-02-05 DIAGNOSIS — E785 Hyperlipidemia, unspecified: Secondary | ICD-10-CM | POA: Diagnosis not present

## 2024-02-05 DIAGNOSIS — Z01818 Encounter for other preprocedural examination: Secondary | ICD-10-CM

## 2024-02-05 DIAGNOSIS — F32A Depression, unspecified: Secondary | ICD-10-CM | POA: Diagnosis not present

## 2024-02-05 DIAGNOSIS — I2510 Atherosclerotic heart disease of native coronary artery without angina pectoris: Secondary | ICD-10-CM

## 2024-02-05 DIAGNOSIS — E039 Hypothyroidism, unspecified: Secondary | ICD-10-CM

## 2024-02-05 DIAGNOSIS — R519 Headache, unspecified: Secondary | ICD-10-CM | POA: Insufficient documentation

## 2024-02-05 DIAGNOSIS — I252 Old myocardial infarction: Secondary | ICD-10-CM | POA: Diagnosis not present

## 2024-02-05 DIAGNOSIS — Z8673 Personal history of transient ischemic attack (TIA), and cerebral infarction without residual deficits: Secondary | ICD-10-CM | POA: Diagnosis not present

## 2024-02-05 DIAGNOSIS — Z79890 Hormone replacement therapy (postmenopausal): Secondary | ICD-10-CM | POA: Insufficient documentation

## 2024-02-05 DIAGNOSIS — Z7982 Long term (current) use of aspirin: Secondary | ICD-10-CM | POA: Diagnosis not present

## 2024-02-05 HISTORY — DX: Other specified health status: Z78.9

## 2024-02-05 HISTORY — PX: LESION EXCISION WITH COMPLEX REPAIR: SHX6700

## 2024-02-05 LAB — RAD ONC ARIA SESSION SUMMARY
Course Elapsed Days: 0
Plan Fractions Treated to Date: 1
Plan Fractions Treated to Date: 1
Plan Prescribed Dose Per Fraction: 5 Gy
Plan Prescribed Dose Per Fraction: 5 Gy
Plan Total Fractions Prescribed: 3
Plan Total Fractions Prescribed: 3
Plan Total Prescribed Dose: 15 Gy
Plan Total Prescribed Dose: 15 Gy
Reference Point Dosage Given to Date: 5 Gy
Reference Point Dosage Given to Date: 5 Gy
Reference Point Session Dosage Given: 5 Gy
Reference Point Session Dosage Given: 5 Gy
Session Number: 1

## 2024-02-05 SURGERY — LESION EXCISION WITH COMPLEX REPAIR
Anesthesia: General | Site: Back

## 2024-02-05 MED ORDER — KETOROLAC TROMETHAMINE 15 MG/ML IJ SOLN
15.0000 mg | Freq: Once | INTRAMUSCULAR | Status: AC
  Administered 2024-02-05: 15 mg via INTRAVENOUS

## 2024-02-05 MED ORDER — CEFAZOLIN SODIUM-DEXTROSE 2-4 GM/100ML-% IV SOLN
INTRAVENOUS | Status: AC
  Filled 2024-02-05: qty 100

## 2024-02-05 MED ORDER — ONDANSETRON HCL 4 MG/2ML IJ SOLN
INTRAMUSCULAR | Status: AC
Start: 2024-02-05 — End: ?
  Filled 2024-02-05: qty 6

## 2024-02-05 MED ORDER — SODIUM CHLORIDE (PF) 0.9 % IJ SOLN
INTRAMUSCULAR | Status: AC
  Filled 2024-02-05: qty 100

## 2024-02-05 MED ORDER — 0.9 % SODIUM CHLORIDE (POUR BTL) OPTIME
TOPICAL | Status: DC | PRN
Start: 2024-02-05 — End: 2024-02-05
  Administered 2024-02-05: 500 mL

## 2024-02-05 MED ORDER — TRIAMCINOLONE ACETONIDE 40 MG/ML IJ SUSP
INTRAMUSCULAR | Status: AC
  Filled 2024-02-05: qty 5

## 2024-02-05 MED ORDER — LIDOCAINE-EPINEPHRINE 1 %-1:100000 IJ SOLN
INTRAMUSCULAR | Status: AC
  Filled 2024-02-05: qty 3

## 2024-02-05 MED ORDER — KETOROLAC TROMETHAMINE 30 MG/ML IJ SOLN
INTRAMUSCULAR | Status: AC
  Filled 2024-02-05: qty 1

## 2024-02-05 MED ORDER — DEXAMETHASONE SODIUM PHOSPHATE 4 MG/ML IJ SOLN
INTRAMUSCULAR | Status: DC | PRN
  Administered 2024-02-05: 5 mg via INTRAVENOUS

## 2024-02-05 MED ORDER — FENTANYL CITRATE (PF) 100 MCG/2ML IJ SOLN
INTRAMUSCULAR | Status: AC
  Filled 2024-02-05: qty 2

## 2024-02-05 MED ORDER — ACETAMINOPHEN 500 MG PO TABS
ORAL_TABLET | ORAL | Status: AC
Start: 2024-02-05 — End: ?
  Filled 2024-02-05: qty 2

## 2024-02-05 MED ORDER — GLYCOPYRROLATE 0.2 MG/ML IJ SOLN
INTRAMUSCULAR | Status: DC | PRN
  Administered 2024-02-05: .2 mg via INTRAVENOUS

## 2024-02-05 MED ORDER — LIDOCAINE 2% (20 MG/ML) 5 ML SYRINGE
INTRAMUSCULAR | Status: AC
  Filled 2024-02-05: qty 15

## 2024-02-05 MED ORDER — OXYCODONE HCL 5 MG/5ML PO SOLN: 5.0000 mg | Freq: Once | ORAL | Status: DC | PRN

## 2024-02-05 MED ORDER — ACETAMINOPHEN 10 MG/ML IV SOLN: 1000.0000 mg | Freq: Once | INTRAVENOUS | Status: DC | PRN

## 2024-02-05 MED ORDER — OXYCODONE HCL 5 MG PO TABS: 5.0000 mg | ORAL_TABLET | Freq: Once | ORAL | Status: DC | PRN

## 2024-02-05 MED ORDER — DROPERIDOL 2.5 MG/ML IJ SOLN: 0.6250 mg | Freq: Once | INTRAMUSCULAR | Status: DC | PRN

## 2024-02-05 MED ORDER — PROPOFOL 10 MG/ML IV BOLUS
INTRAVENOUS | Status: DC | PRN
  Administered 2024-02-05: 100 mg via INTRAVENOUS

## 2024-02-05 MED ORDER — ACETAMINOPHEN 500 MG PO TABS
1000.0000 mg | ORAL_TABLET | Freq: Once | ORAL | Status: AC
  Administered 2024-02-05: 1000 mg via ORAL

## 2024-02-05 MED ORDER — DEXMEDETOMIDINE HCL IN NACL 80 MCG/20ML IV SOLN
INTRAVENOUS | Status: AC
  Filled 2024-02-05: qty 20

## 2024-02-05 MED ORDER — CEFAZOLIN SODIUM-DEXTROSE 2-4 GM/100ML-% IV SOLN
2.0000 g | INTRAVENOUS | Status: AC
  Administered 2024-02-05: 2 g via INTRAVENOUS

## 2024-02-05 MED ORDER — ROCURONIUM BROMIDE 10 MG/ML (PF) SYRINGE
PREFILLED_SYRINGE | INTRAVENOUS | Status: AC
  Filled 2024-02-05: qty 20

## 2024-02-05 MED ORDER — BUPIVACAINE-EPINEPHRINE (PF) 0.25% -1:200000 IJ SOLN
INTRAMUSCULAR | Status: AC
Start: 2024-02-05 — End: ?
  Filled 2024-02-05: qty 120

## 2024-02-05 MED ORDER — SUGAMMADEX SODIUM 200 MG/2ML IV SOLN
INTRAVENOUS | Status: DC | PRN
  Administered 2024-02-05: 200 mg via INTRAVENOUS
  Administered 2024-02-05: 100 mg via INTRAVENOUS

## 2024-02-05 MED ORDER — ROCURONIUM BROMIDE 100 MG/10ML IV SOLN
INTRAVENOUS | Status: DC | PRN
Start: 2024-02-05 — End: 2024-02-05
  Administered 2024-02-05: 60 mg via INTRAVENOUS

## 2024-02-05 MED ORDER — PROPOFOL 500 MG/50ML IV EMUL
INTRAVENOUS | Status: AC
  Filled 2024-02-05: qty 100

## 2024-02-05 MED ORDER — LACTATED RINGERS IV SOLN: INTRAVENOUS | Status: DC

## 2024-02-05 MED ORDER — BUPIVACAINE HCL (PF) 0.25 % IJ SOLN
INTRAMUSCULAR | Status: AC
  Filled 2024-02-05: qty 120

## 2024-02-05 MED ORDER — LIDOCAINE HCL (CARDIAC) PF 100 MG/5ML IV SOSY
PREFILLED_SYRINGE | INTRAVENOUS | Status: DC | PRN
Start: 2024-02-05 — End: 2024-02-05
  Administered 2024-02-05: 100 mg via INTRAVENOUS

## 2024-02-05 MED ORDER — PHENYLEPHRINE 80 MCG/ML (10ML) SYRINGE FOR IV PUSH (FOR BLOOD PRESSURE SUPPORT)
PREFILLED_SYRINGE | INTRAVENOUS | Status: AC
  Filled 2024-02-05: qty 30

## 2024-02-05 MED ORDER — PHENYLEPHRINE HCL (PRESSORS) 10 MG/ML IV SOLN
INTRAVENOUS | Status: DC | PRN
Start: 2024-02-05 — End: 2024-02-05
  Administered 2024-02-05: 80 ug via INTRAVENOUS
  Administered 2024-02-05 (×4): 160 ug via INTRAVENOUS
  Administered 2024-02-05: 80 ug via INTRAVENOUS

## 2024-02-05 MED ORDER — ONDANSETRON HCL 4 MG/2ML IJ SOLN
INTRAMUSCULAR | Status: DC | PRN
  Administered 2024-02-05: 4 mg via INTRAVENOUS

## 2024-02-05 MED ORDER — BUPIVACAINE LIPOSOME 1.3 % IJ SUSP
INTRAMUSCULAR | Status: AC
  Filled 2024-02-05: qty 20

## 2024-02-05 MED ORDER — OXYCODONE HCL 5 MG PO TABS: 5.0000 mg | ORAL_TABLET | Freq: Three times a day (TID) | ORAL | 0 refills | Status: AC | PRN

## 2024-02-05 MED ORDER — EPHEDRINE SULFATE (PRESSORS) 50 MG/ML IJ SOLN
INTRAMUSCULAR | Status: DC | PRN
  Administered 2024-02-05 (×4): 5 mg via INTRAVENOUS

## 2024-02-05 MED ORDER — BUPIVACAINE LIPOSOME 1.3 % IJ SUSP
INTRAMUSCULAR | Status: DC | PRN
  Administered 2024-02-05: 30 mL

## 2024-02-05 MED ORDER — DEXAMETHASONE SODIUM PHOSPHATE 10 MG/ML IJ SOLN
INTRAMUSCULAR | Status: AC
  Filled 2024-02-05: qty 3

## 2024-02-05 MED ORDER — EPHEDRINE 5 MG/ML INJ
INTRAVENOUS | Status: AC
  Filled 2024-02-05: qty 15

## 2024-02-05 MED ORDER — FENTANYL CITRATE (PF) 100 MCG/2ML IJ SOLN
25.0000 ug | INTRAMUSCULAR | Status: DC | PRN
  Administered 2024-02-05 (×4): 25 ug via INTRAVENOUS

## 2024-02-05 MED ORDER — FENTANYL CITRATE (PF) 100 MCG/2ML IJ SOLN
INTRAMUSCULAR | Status: DC | PRN
  Administered 2024-02-05: 50 ug via INTRAVENOUS

## 2024-02-05 SURGICAL SUPPLY — 67 items
BLADE CLIPPER SURG (BLADE) IMPLANT
BLADE SURG 15 STRL LF DISP TIS (BLADE) ×4 IMPLANT
BNDG ELASTIC 2INX 5YD STR LF (GAUZE/BANDAGES/DRESSINGS) IMPLANT
BNDG GAUZE DERMACEA FLUFF 4 (GAUZE/BANDAGES/DRESSINGS) IMPLANT
CANISTER SUCT 1200ML W/VALVE (MISCELLANEOUS) ×1 IMPLANT
CLSR STERI-STRIP ANTIMIC 1/2X4 (GAUZE/BANDAGES/DRESSINGS) IMPLANT
CORD BIPOLAR FORCEPS 12FT (ELECTRODE) IMPLANT
COVER BACK TABLE 60X90IN (DRAPES) ×3 IMPLANT
COVER MAYO STAND STRL (DRAPES) ×3 IMPLANT
DERMABOND ADVANCED .7 DNX12 (GAUZE/BANDAGES/DRESSINGS) IMPLANT
DRAPE LAPAROTOMY T 102X78X121 (DRAPES) IMPLANT
DRAPE U-SHAPE 76X120 STRL (DRAPES) IMPLANT
DRSG ADAPTIC 3X8 NADH LF (GAUZE/BANDAGES/DRESSINGS) IMPLANT
DRSG EMULSION OIL 3X3 NADH (GAUZE/BANDAGES/DRESSINGS) IMPLANT
DRSG MEPILEX POST OP 4X8 (GAUZE/BANDAGES/DRESSINGS) ×2 IMPLANT
ELECT COATED BLADE 2.86 ST (ELECTRODE) IMPLANT
ELECT NDL BLADE 2-5/6 (NEEDLE) IMPLANT
ELECT NEEDLE BLADE 2-5/6 (NEEDLE) IMPLANT
ELECT REM PT RETURN 9FT ADLT (ELECTROSURGICAL) IMPLANT
ELECT REM PT RETURN 9FT PED (ELECTROSURGICAL) IMPLANT
ELECTRODE REM PT RETRN 9FT PED (ELECTROSURGICAL) IMPLANT
ELECTRODE REM PT RTRN 9FT ADLT (ELECTROSURGICAL) IMPLANT
GAUZE PAD ABD 8X10 STRL (GAUZE/BANDAGES/DRESSINGS) IMPLANT
GAUZE SPONGE 2X2 STRL 8-PLY (GAUZE/BANDAGES/DRESSINGS) IMPLANT
GAUZE SPONGE 4X4 12PLY STRL LF (GAUZE/BANDAGES/DRESSINGS) IMPLANT
GAUZE STRETCH 2X75IN STRL (MISCELLANEOUS) IMPLANT
GAUZE XEROFORM 1X8 LF (GAUZE/BANDAGES/DRESSINGS) IMPLANT
GAUZE XEROFORM 5X9 LF (GAUZE/BANDAGES/DRESSINGS) IMPLANT
GLOVE BIO SURGEON STRL SZ8 (GLOVE) ×4 IMPLANT
GLOVE BIOGEL M STRL SZ7.5 (GLOVE) ×4 IMPLANT
GLOVE BIOGEL PI IND STRL 7.5 (GLOVE) ×2 IMPLANT
GOWN STRL REUS W/ TWL LRG LVL3 (GOWN DISPOSABLE) ×1 IMPLANT
GOWN STRL REUS W/ TWL XL LVL3 (GOWN DISPOSABLE) IMPLANT
GOWN STRL REUS W/TWL XL LVL3 (GOWN DISPOSABLE) ×4 IMPLANT
HIBICLENS CHG 4% 4OZ BTL (MISCELLANEOUS) ×3 IMPLANT
NDL HYPO 30GX1 BEV (NEEDLE) IMPLANT
NDL PRECISIONGLIDE 27X1.5 (NEEDLE) IMPLANT
NDL SPNL 18GX3.5 QUINCKE PK (NEEDLE) IMPLANT
NEEDLE HYPO 30GX1 BEV (NEEDLE) ×2 IMPLANT
NEEDLE PRECISIONGLIDE 27X1.5 (NEEDLE) IMPLANT
NEEDLE SPNL 18GX3.5 QUINCKE PK (NEEDLE) IMPLANT
NS IRRIG 1000ML POUR BTL (IV SOLUTION) ×1 IMPLANT
PACK BASIN DAY SURGERY FS (CUSTOM PROCEDURE TRAY) ×3 IMPLANT
PENCIL SMOKE EVACUATOR (MISCELLANEOUS) IMPLANT
SHEET MEDIUM DRAPE 40X70 STRL (DRAPES) ×1 IMPLANT
SLEEVE SCD COMPRESS KNEE MED (STOCKING) ×1 IMPLANT
STAPLER SKIN PROX WIDE 3.9 (STAPLE) ×1 IMPLANT
STRIP CLOSURE SKIN 1/2X4 (GAUZE/BANDAGES/DRESSINGS) IMPLANT
STRIP SUTURE WOUND CLOSURE 1/2 (MISCELLANEOUS) IMPLANT
SUCTION TUBE FRAZIER 10FR DISP (SUCTIONS) ×1 IMPLANT
SUT CHROMIC 4 0 P 3 18 (SUTURE) IMPLANT
SUT ETHILON 4 0 PS 2 18 (SUTURE) IMPLANT
SUT MNCRL AB 3-0 PS2 27 (SUTURE) ×2 IMPLANT
SUT MNCRL AB 4-0 PS2 18 (SUTURE) ×2 IMPLANT
SUT MON AB 5-0 P3 18 (SUTURE) IMPLANT
SUT NYLON ETHILON 5-0 P-3 1X18 (SUTURE) IMPLANT
SUT PDS 3-0 CT2 (SUTURE) IMPLANT
SUT PDS II 3-0 CT2 27 ABS (SUTURE) IMPLANT
SUT PROLENE 5 0 P 3 (SUTURE) IMPLANT
SUT VIC AB 3-0 SH 27X BRD (SUTURE) IMPLANT
SUT VIC AB 4-0 PS2 18 (SUTURE) IMPLANT
SUT VICRYL RAPIDE 4-0 (SUTURE) IMPLANT
SYR BULB EAR ULCER 3OZ GRN STR (SYRINGE) ×1 IMPLANT
SYR CONTROL 10ML LL (SYRINGE) ×3 IMPLANT
TOWEL GREEN STERILE FF (TOWEL DISPOSABLE) ×3 IMPLANT
TRAY DSU PREP LF (CUSTOM PROCEDURE TRAY) ×1 IMPLANT
TUBE CONNECTING 20X1/4 (TUBING) ×1 IMPLANT

## 2024-02-05 NOTE — Discharge Instructions (Addendum)
 Diet: High protein, low sugar.  Medications: Please take the Zofran as needed for nausea symptoms.  As for pain control, please take Tylenol 500 mg every 6 hours as needed.  The prescribed oxycodone should be taken only as needed for breakthrough pain.  Please note that this drug can make you drowsy and prone to falls.  Please do not restart aspirin until 02/07/2023.  Wound care: Your excision sites were repaired with sutures followed by skin glue and silicone bordered dressings. No specific wound care needed.   Activity: Please avoid any significant lifting, pushing, or pulling.   Mild drainage from underneath the dressing is OK. However, please call the office should you have severe pain or swelling, wound dehiscence (if it completely opens), or if you have any fevers.    No Tylenol until 1:00pm today, if needed.  Information for Discharge Teaching: EXPAREL (bupivacaine liposome injectable suspension)   Pain relief is important to your recovery. The goal is to control your pain so you can move easier and return to your normal activities as soon as possible after your procedure. Your physician may use several types of medicines to manage pain, swelling, and more.  Your surgeon or anesthesiologist gave you EXPAREL(bupivacaine) to help control your pain after surgery.  EXPAREL is a local anesthetic designed to release slowly over an extended period of time to provide pain relief by numbing the tissue around the surgical site. EXPAREL is designed to release pain medication over time and can control pain for up to 72 hours. Depending on how you respond to EXPAREL, you may require less pain medication during your recovery. EXPAREL can help reduce or eliminate the need for opioids during the first few days after surgery when pain relief is needed the most. EXPAREL is not an opioid and is not addictive. It does not cause sleepiness or sedation.   Important! A teal colored band has been placed on  your arm with the date, time and amount of EXPAREL you have received. Please leave this armband in place for the full 96 hours following administration, and then you may remove the band. If you return to the hospital for any reason within 96 hours (Saturday 02/09/24) following the administration of EXPAREL, the armband provides important information that your health care providers to know, and alerts them that you have received this anesthetic.    Possible side effects of EXPAREL: Temporary loss of sensation or ability to move in the area where medication was injected. Nausea, vomiting, constipation Rarely, numbness and tingling in your mouth or lips, lightheadedness, or anxiety may occur. Call your doctor right away if you think you may be experiencing any of these sensations, or if you have other questions regarding possible side effects.  Follow all other discharge instructions given to you by your surgeon or nurse. Eat a healthy diet and drink plenty of water or other fluids.

## 2024-02-05 NOTE — Transfer of Care (Signed)
 Immediate Anesthesia Transfer of Care Note  Patient: Ashley Hanson  Procedure(s) Performed: RESECTION OF KELOIDS X2 WITH COMPLEX CLOSURE (Back)  Patient Location: PACU  Anesthesia Type:General  Level of Consciousness: awake, alert , and patient cooperative  Airway & Oxygen Therapy: Patient Spontanous Breathing and Patient connected to face mask oxygen  Post-op Assessment: Report given to RN and Post -op Vital signs reviewed and stable  Post vital signs: Reviewed and stable  Last Vitals:  Vitals Value Taken Time  BP 125/66 02/05/24 0930  Temp    Pulse 67 02/05/24 0931  Resp 15 02/05/24 0929  SpO2 99 % 02/05/24 0931  Vitals shown include unfiled device data.  Last Pain:  Vitals:   02/05/24 0654  TempSrc: Temporal  PainSc: 10-Worst pain ever      Patients Stated Pain Goal: 5 (02/05/24 0654)  Complications: No notable events documented.

## 2024-02-05 NOTE — Anesthesia Procedure Notes (Addendum)
 Procedure Name: Intubation Date/Time: 02/05/2024 8:10 AM  Performed by: Earmon Phoenix, CRNAPre-anesthesia Checklist: Patient identified, Emergency Drugs available, Suction available, Patient being monitored and Timeout performed Patient Re-evaluated:Patient Re-evaluated prior to induction Oxygen Delivery Method: Circle system utilized Preoxygenation: Pre-oxygenation with 100% oxygen Induction Type: IV induction Ventilation: Mask ventilation without difficulty and Oral airway inserted - appropriate to patient size Laryngoscope Size: Mac and 3 Grade View: Grade I Tube type: Oral Tube size: 7.0 mm Number of attempts: 1 Airway Equipment and Method: Stylet Placement Confirmation: ETT inserted through vocal cords under direct vision, positive ETCO2 and breath sounds checked- equal and bilateral Secured at: 21 cm Tube secured with: Tape Dental Injury: Teeth and Oropharynx as per pre-operative assessment

## 2024-02-05 NOTE — Telephone Encounter (Signed)
 Patient's daughter called stating that after patient's radiation treatment today the bandages from surgery are coming off and won't stay on. She stated that during radiation they have to take the bandages off and on. She asked what alternative dressings should she use. I adv that there were no providers here at the moment for me to ask however, I was send a message to Dr. Ladona Ridgel and the PA staff. I adv her to use a 4 x 4 gauze and tape or a band-aid big enough to cover the excision in the meantime until a provider could confirm or suggest something different. She conveyed understanding.   She mentioned that there were 2 blood spots on the bandage. I adv that it is normal to have a little blood on the dressings after surgery however, if the amount increases, if swelling or redness develops, if she has a fever or increased pain then give the office a call back and the on call provider will assist her. She conveyed understanding.

## 2024-02-05 NOTE — Anesthesia Postprocedure Evaluation (Signed)
 Anesthesia Post Note  Patient: Ashley Hanson  Procedure(s) Performed: RESECTION OF KELOIDS X2 WITH COMPLEX CLOSURE (Back)     Patient location during evaluation: PACU Anesthesia Type: General Level of consciousness: awake and alert Pain management: pain level controlled Vital Signs Assessment: post-procedure vital signs reviewed and stable Respiratory status: spontaneous breathing, nonlabored ventilation, respiratory function stable and patient connected to nasal cannula oxygen Cardiovascular status: blood pressure returned to baseline and stable Postop Assessment: no apparent nausea or vomiting Anesthetic complications: no   No notable events documented.  Last Vitals:  Vitals:   02/05/24 1040 02/05/24 1045  BP:  (!) 116/45  Pulse: (!) 50 (!) 51  Resp: 17 15  Temp: (!) 36.1 C   SpO2: 98% 97%    Last Pain:  Vitals:   02/05/24 1045  TempSrc:   PainSc: 5                  [place]

## 2024-02-05 NOTE — Interval H&P Note (Signed)
 History and Physical Interval Note: No change in exam or indication for surgery Sites marked with her concurrence All questions answered. XRT arranged. Will proceed at her request  02/05/2024 7:02 AM  Ashley Hanson  has presented today for surgery, with the diagnosis of keloid scar.  The various methods of treatment have been discussed with the patient and family. After consideration of risks, benefits and other options for treatment, the patient has consented to  Procedure(s): RESECTION OF KELOIDS X2 WITH COMPLEX CLOSURE (N/A) POSSIBLE USE OF MYRAID (N/A) as a surgical intervention.  The patient's history has been reviewed, patient examined, no change in status, stable for surgery.  I have reviewed the patient's chart and labs.  Questions were answered to the patient's satisfaction.     Santiago Glad

## 2024-02-05 NOTE — Addendum Note (Signed)
 Addendum  created 02/05/24 1452 by Earmon Phoenix, CRNA   Intraprocedure Event edited, Intraprocedure Meds edited, Intraprocedure Staff edited

## 2024-02-05 NOTE — Op Note (Addendum)
 DATE OF OPERATION: 02/05/2024  LOCATION: [facility] operating Room  PREOPERATIVE DIAGNOSIS: Keloids x 2, upper right back and posterior right shoulder  POSTOPERATIVE DIAGNOSIS: Same  PROCEDURE: Excision of keloids with primary closure  SURGEON: Loren Racer, MD  ASSISTANT: Evelena Leyden  EBL: 10 cc  CONDITION: Stable  COMPLICATIONS: None  INDICATION: The patient, Ashley Hanson, is a 83 y.o. female born on Apr 04, 1941, is here for treatment of recurrent keloids on the back.   PROCEDURE DETAILS:  The patient was seen prior to surgery and marked.  The IV antibiotics were given. The patient was taken to the operating room and given a general anesthetic. A standard time out was performed and all information was confirmed by those in the room. SCDs were placed.   The back was prepped and draped sterilely.  The upper back keloid was addressed first.  An elliptical incision was made around the keloid and the skin excised down to the subcutaneous fat.  The borders of the wound were then undermined for approximately 1 cm in all directions.  The wound was infiltrated with a mixture of Exparel and quarter percent plain Marcaine.  Hemostasis was achieved with electrocautery and the wound was irrigated with saline.  The dermis was then closed with interrupted and running 3-0 Monocryl sutures and the skin was closed with a running 4-0 Monocryl subcuticular stitch.  The total length of the incision was 8 cm and the wound was 2.5 cm wide prior to closure.  The keloid was 5.5 cm in length.  Attention was turned to the right posterior shoulder keloid.  Again an elliptical incision was made around the keloid and the skin was excised down to subcutaneous tissue.  The length of the keloid was approximately 4.5 cm in the total length of the incision was approximately 7 cm.  The wound edges were undermined approximately 1 cm in all directions.  Both keloids were sent to pathology for routine examination.  The  surgical bed was infiltrated with the Exparel mixture and irrigated.  After obtaining hemostasis the wound was closed with interrupted and running 3-0 Monocryl sutures in the dermis and a running 4-0 Monocryl subcuticular stitch.  Both incisions were sealed with Dermabond and sterile dressings were applied.  The patient was awakened from anesthesia without incident transferred to the recovery room in good condition.  All instrument needle and sponge counts were reported as correct.  The patient will be sent to radiation oncology later this morning for her first radiation treatment. The patient was allowed to wake up and taken to recovery room in stable condition at the end of the case. The family was notified at the end of the case.   The advanced practice practitioner (APP) assisted throughout the case.  The APP was essential in retraction and counter traction when needed to make the case progress smoothly.  This retraction and assistance made it possible to see the tissue plans for the procedure.  The assistance was needed for blood control, tissue re-approximation and assisted with closure of the incision site.

## 2024-02-06 ENCOUNTER — Encounter (HOSPITAL_BASED_OUTPATIENT_CLINIC_OR_DEPARTMENT_OTHER): Payer: Self-pay | Admitting: Plastic Surgery

## 2024-02-06 ENCOUNTER — Telehealth: Payer: Self-pay

## 2024-02-06 ENCOUNTER — Other Ambulatory Visit: Payer: Self-pay

## 2024-02-06 ENCOUNTER — Ambulatory Visit
Admission: RE | Admit: 2024-02-06 | Discharge: 2024-02-06 | Disposition: A | Discharge status: Home / Self Care | Payer: Medicare Other | Source: Ambulatory Visit | Attending: Radiation Oncology | Admitting: Radiation Oncology

## 2024-02-06 DIAGNOSIS — Z510 Encounter for antineoplastic radiation therapy: Secondary | ICD-10-CM | POA: Diagnosis not present

## 2024-02-06 LAB — RAD ONC ARIA SESSION SUMMARY
Course Elapsed Days: 1
Plan Fractions Treated to Date: 2
Plan Fractions Treated to Date: 2
Plan Prescribed Dose Per Fraction: 5 Gy
Plan Prescribed Dose Per Fraction: 5 Gy
Plan Total Fractions Prescribed: 3
Plan Total Fractions Prescribed: 3
Plan Total Prescribed Dose: 15 Gy
Plan Total Prescribed Dose: 15 Gy
Reference Point Dosage Given to Date: 10 Gy
Reference Point Dosage Given to Date: 10 Gy
Reference Point Session Dosage Given: 5 Gy
Reference Point Session Dosage Given: 5 Gy
Session Number: 2

## 2024-02-06 LAB — SURGICAL PATHOLOGY

## 2024-02-06 NOTE — Telephone Encounter (Signed)
 Yes, I agree with the response. Her excision sites were closed. Mild incisional drainage is perfectly fine. We had placed bordered Mepilex dressings after closure, but if they were discarded at her radiation appointment then she can simply transition to gauze or similar bandage. Thank you

## 2024-02-06 NOTE — Telephone Encounter (Signed)
 Patient's daughter left a voicemail stating the dressings still will not stay on. She asked if there was something they can pick up. I attempted to call the patient's daughter back but there was no answer. I left a message stating they can try to look for the Mepilex or foam boarder dressings at [facility]. I instructed them to call back or send a MyChart message with further questions.

## 2024-02-07 ENCOUNTER — Ambulatory Visit: Payer: Medicare Other

## 2024-02-08 ENCOUNTER — Other Ambulatory Visit: Payer: Self-pay

## 2024-02-08 ENCOUNTER — Ambulatory Visit
Admission: RE | Admit: 2024-02-08 | Discharge: 2024-02-08 | Disposition: A | Discharge status: Home / Self Care | Source: Ambulatory Visit | Attending: Radiation Oncology | Admitting: Radiation Oncology

## 2024-02-08 DIAGNOSIS — Z510 Encounter for antineoplastic radiation therapy: Secondary | ICD-10-CM | POA: Diagnosis not present

## 2024-02-08 LAB — RAD ONC ARIA SESSION SUMMARY
Course Elapsed Days: 3
Plan Fractions Treated to Date: 3
Plan Fractions Treated to Date: 3
Plan Prescribed Dose Per Fraction: 5 Gy
Plan Prescribed Dose Per Fraction: 5 Gy
Plan Total Fractions Prescribed: 3
Plan Total Fractions Prescribed: 3
Plan Total Prescribed Dose: 15 Gy
Plan Total Prescribed Dose: 15 Gy
Reference Point Dosage Given to Date: 15 Gy
Reference Point Dosage Given to Date: 15 Gy
Reference Point Session Dosage Given: 5 Gy
Reference Point Session Dosage Given: 5 Gy
Session Number: 3

## 2024-02-11 ENCOUNTER — Ambulatory Visit (INDEPENDENT_AMBULATORY_CARE_PROVIDER_SITE_OTHER): Payer: Medicare Other | Admitting: Plastic Surgery

## 2024-02-11 ENCOUNTER — Encounter: Payer: Medicare Other | Admitting: Physician Assistant

## 2024-02-11 VITALS — BP 126/67 | HR 51

## 2024-02-11 DIAGNOSIS — L910 Hypertrophic scar: Secondary | ICD-10-CM

## 2024-02-11 NOTE — Radiation Completion Notes (Signed)
 Patient Name: Ashley Hanson, Ashley Hanson MRN: [id number] Date of Birth: 05/11/1941 Referring Physician: Mickel Crow, M.D. Date of Service: 2024-02-11 Radiation Oncologist: Lonie Peak, M.D. [facility]                             RADIATION ONCOLOGY END OF TREATMENT NOTE     Diagnosis: L91.0 Hypertrophic scar Intent: Curative     ==========DELIVERED PLANS==========  First Treatment Date: 2024-02-05 Last Treatment Date: 2024-02-08   Plan Name: Chest_Back_BO Site: Thorax Technique: Electron Mode: Electron Dose Per Fraction: 5 Gy Prescribed Dose (Delivered / Prescribed): 15 Gy / 15 Gy Prescribed Fxs (Delivered / Prescribed): 3 / 3   Plan Name: Chest_RShdrBO Site: Thorax Technique: Electron Mode: Electron Dose Per Fraction: 5 Gy Prescribed Dose (Delivered / Prescribed): 15 Gy / 15 Gy Prescribed Fxs (Delivered / Prescribed): 3 / 3     ==========ON TREATMENT VISIT DATES========== 2024-02-08     ==========UPCOMING VISITS========== 03/11/2024 [facility] SPE[facility] POST OP Lorelee New, PA-C  02/25/2024 [facility] SPE[facility] POST OP Lorelee New, PA-C  02/11/2024 [facility] SPE[facility] POST OP Santiago Glad, MD        ==========APPENDIX - ON TREATMENT VISIT NOTES==========   See weekly On Treatment Notes in Epic for details in the Media tab (listed as Progress notes on the On Treatment Visit Dates listed above).

## 2024-02-13 NOTE — Progress Notes (Signed)
 Ashley Hanson returns after excision of back and shoulder keloids.  She is doing well with no specific complaints.  She states that she did well with her radiation therapy.  On examination there is 1 small area approximately 2 mm in size with some separation of the incision at the shoulder excision site.  This is simply covered with a Band-Aid and we will reevaluate it in 1 to 2 weeks.  Otherwise patient is doing well.  She will begin scar massage at 2 weeks.  Follow-up 1 to 2 weeks.

## 2024-02-25 ENCOUNTER — Ambulatory Visit: Payer: Medicare Other | Admitting: Physician Assistant

## 2024-02-25 VITALS — BP 136/55 | HR 52

## 2024-02-25 DIAGNOSIS — L910 Hypertrophic scar: Secondary | ICD-10-CM

## 2024-02-25 NOTE — Progress Notes (Signed)
 Patient is a pleasant 83 year old female s/p excision of keloid from right upper back and posterior right shoulder performed 02/05/2024 by Dr. Ladona Ridgel who presents to clinic for postoperative follow-up.  She was treated afterward with radiation.  She was last seen here in clinic on 02/11/2024.  At that time, there was a small area of separation involving the shoulder excision site.  This was covered with a Band-Aid and plan was for reevaluation in 1 to 2 weeks.   Today, patient is doing well.  Daughter at bedside is assisting with dressing changes.  Deny any pain or other complaints.  They are applying vitamin E oil followed by silicone scar tape.  On exam, incision sites are healing appropriately.  There is a small, superficial 2 mm wound on the inferior aspect of right shoulder keloid excision site.  No surrounding erythema.  No expressible drainage.  Remainder of incisions are well-approximated and healing appropriately.  Mild to moderate amount of residual overlying Dermabond.  Recommending that patient and daughter transition to using Vaseline or Aquaphor to help the residual Dermabond sloughed off.  Afterwards, they can transition to vitamin E oil if they would like and continue with silicone scar gel versus scar tape.  They may return to clinic in 2 weeks, particularly if they have any specific questions or concerns. Picture(s) obtained of the patient and placed in the chart were with the patient's or guardian's permission.

## 2024-03-11 ENCOUNTER — Encounter: Payer: Medicare Other | Admitting: Physician Assistant

## 2024-03-11 ENCOUNTER — Ambulatory Visit (INDEPENDENT_AMBULATORY_CARE_PROVIDER_SITE_OTHER): Payer: Medicare Other | Admitting: Physician Assistant

## 2024-03-11 DIAGNOSIS — L910 Hypertrophic scar: Secondary | ICD-10-CM

## 2024-03-11 NOTE — Progress Notes (Signed)
 Patient is a pleasant 83 year old female s/p excision of keloid from right upper back and posterior right shoulder performed 02/05/2024 by Dr. Carolynne Citron who presents to clinic for postoperative follow-up. She was treated afterward with radiation.   She was last seen here in clinic on 02/25/2024.  At that time, the excision sites were healing appropriately.  However, there was a small, superficial 2 mm wound on the inferior aspect of right shoulder keloid excision site.  Recommended that they transition to Vaseline/Aquaphor to help with residual Dermabond.  She can then transition back to a silicone scar gel or vitamin E oil as they had preferred.  Follow-up in 2 weeks for likely final postoperative encounter.  Today, patient is doing well.  She is companied by daughter at bedside.  They have been applying Vaseline and skin sensitive bandages to her excision sites.  Patient denies any discomfort or setbacks.  No specific concerns or complaints.  On exam, the excision site medially on upper back has healed nicely.  However, the excision site on the posterior right shoulder has a persistent incisional wound, largely unchanged compared to most recent encounter.  There is slough at the base.  Remainder of incision is well-healed.  A few scattered sutures are removed without complication or difficulty.  Recommending continued Vaseline and bordered skin sensitive dressings.  Informed patient and daughter that they will likely see a yellowish drainage on the bandages as the nonviable incisional slough begins to melt away, but that there should be some healthy granulation underneath.  Patient tells me that she chronically is a poor healer and she is not surprised that she has not yet fully recovered.  She denies any CKD or DM.  Discussed increased protein in the diet to help facilitate wound healing.    Follow-up in 3 weeks, sooner if needed.  Picture(s) obtained of the patient and placed in the chart were with the  patient's or guardian's permission.

## 2024-04-01 NOTE — Progress Notes (Signed)
 Patient is a pleasant 83 year old female s/p excision of keloid from right upper back and posterior right shoulder performed 02/05/2024 by Dr. Carolynne Citron who presents to clinic for postoperative follow-up. She was treated afterward with radiation.   She was last seen here in clinic on 03/11/2024.  At that time, the excision medially on the upper back had healed nicely.  However, the excision site on the posterior right shoulder had a persistent incisional wound, largely unchanged from prior encounter.  Slough at base.  Recommended continued Vaseline and bordered skin sensitive dressings.  Patient reported that she is chronically a poor healer.  Follow-up in 3 weeks.  Today,

## 2024-04-02 ENCOUNTER — Ambulatory Visit: Admitting: Physician Assistant

## 2024-04-02 DIAGNOSIS — L910 Hypertrophic scar: Secondary | ICD-10-CM

## 2024-04-23 ENCOUNTER — Ambulatory Visit (INDEPENDENT_AMBULATORY_CARE_PROVIDER_SITE_OTHER): Admitting: Physician Assistant

## 2024-04-23 DIAGNOSIS — L910 Hypertrophic scar: Secondary | ICD-10-CM

## 2024-04-23 NOTE — Progress Notes (Signed)
 Patient is a pleasant 83 year old female s/p excision of keloid from right upper back and posterior right shoulder performed 02/05/2024 by Dr. Carolynne Citron who presents to clinic for postoperative follow-up. She was treated afterward with radiation   She was last seen in clinic on 04/02/2024.  At that time, she and her daughter had not noticed any considerable improvement in her wound.  They have been applying Vaseline and covering with a bandage, as directed.  The excision site medially had fully healed.  However, the excision site on the posterior right shoulder did have persistent incisional wound approximately 1 x 1 cm with 0.5 cm depth.  Less slough than previous encounter.  Some granulation at base.  Placed Hydrofera Blue dressing over the wound.  Not a candidate for prism  due to insurance, instead recommended transition to Medihoney patches and follow-up in 3 weeks.  Today, patient is doing well.  Accompanied by daughter at bedside.  They left the Hydrofera Blue in place and has since been using Medihoney patches.  On exam, the medial incision is well-healed, but there is peri excision site hyperpigmentation in the shape of an adhesive bandage.  As for the right shoulder excision site, there was a overlying scab at the site of the wound which was gently removed revealing underlying epithelialization and wound healing.  Significant improvement compared to previous encounter.  Given epithelialization, at this time she can follow-up only as needed.  However, emphasized the importance of increased moisturization to each of the excision sites and surrounding skin.  Her hyperpigmentation is likely related to the adhesive bandages used after surgery.  Daughter does report that she has significant skin sensitivity.  Picture(s) obtained of the patient and placed in the chart were with the patient's or guardian's permission.  She will call the office should she have questions or concerns.

## 2024-04-30 ENCOUNTER — Ambulatory Visit (INDEPENDENT_AMBULATORY_CARE_PROVIDER_SITE_OTHER): Admitting: Podiatry

## 2024-04-30 ENCOUNTER — Encounter: Payer: Self-pay | Admitting: Podiatry

## 2024-04-30 VITALS — Ht 65.0 in | Wt 250.0 lb

## 2024-04-30 DIAGNOSIS — L600 Ingrowing nail: Secondary | ICD-10-CM

## 2024-04-30 NOTE — Patient Instructions (Signed)

## 2024-05-01 NOTE — Progress Notes (Signed)
 Subjective:   Patient ID: Ashley Hanson, female   DOB: 83 y.o.   MRN: [id number]   HPI Patient presents with caregiver with severe damage to the big toenails of both feet stating they are impossible to cut they are getting worse they are painful and they would like them removed if possible.  Patient is in relatively poor health with lymphedema but does have good overall perfusion.  Patient does not smoke is not active Review of Systems  All other systems reviewed and are negative.       Objective:  Physical Exam Vitals and nursing note reviewed.  Constitutional:      Appearance: She is well-developed.  Pulmonary:     Effort: Pulmonary effort is normal.   Musculoskeletal:        General: Normal range of motion.   Skin:    General: Skin is warm.   Neurological:     Mental Status: She is alert.     Neurovascular status intact muscle strength was found to be slightly reduced range of motion reduced subtalar midtarsal joint.  Patient is found to have severely damaged big toenails of both feet that are dystrophic and painful and very hard for her to wear shoe gear with in a comfortable way.  Patient has good digital perfusion well-oriented x 3     Assessment:  Severe damage hallux nails bilateral complete inability to cut partially loose and painful     Plan:  H&P reviewed with her and caregiver and I do think it would probably be best removed I explained procedure risk patient wants surgery understanding risk and at this point signed consent form.  I infiltrated each big toe 60 mg like Marcaine  mixture sterile prep done using sterile instrumentation removed hallux nails exposed matrix applied phenol 5 applications 30 seconds followed by alcohol lavage sterile dressing gave instructions on soaks wear dressings 24 hours take them off earlier if throbbing were to occur

## 2024-09-03 ENCOUNTER — Other Ambulatory Visit: Payer: Self-pay | Admitting: Internal Medicine

## 2024-09-03 DIAGNOSIS — Z1231 Encounter for screening mammogram for malignant neoplasm of breast: Secondary | ICD-10-CM

## 2024-09-23 ENCOUNTER — Ambulatory Visit

## 2024-09-24 ENCOUNTER — Ambulatory Visit
Admission: RE | Admit: 2024-09-24 | Discharge: 2024-09-24 | Disposition: A | Source: Ambulatory Visit | Attending: Internal Medicine

## 2024-09-24 DIAGNOSIS — Z1231 Encounter for screening mammogram for malignant neoplasm of breast: Secondary | ICD-10-CM

## 2024-09-29 ENCOUNTER — Other Ambulatory Visit: Payer: Self-pay | Admitting: Internal Medicine

## 2024-09-29 DIAGNOSIS — R928 Other abnormal and inconclusive findings on diagnostic imaging of breast: Secondary | ICD-10-CM

## 2024-10-10 ENCOUNTER — Ambulatory Visit

## 2024-11-17 ENCOUNTER — Encounter

## 2024-11-17 ENCOUNTER — Other Ambulatory Visit

## 2024-11-19 ENCOUNTER — Ambulatory Visit
Admission: RE | Admit: 2024-11-19 | Discharge: 2024-11-19 | Disposition: A | Discharge status: Home / Self Care | Source: Ambulatory Visit | Attending: Internal Medicine | Admitting: Internal Medicine

## 2024-11-19 DIAGNOSIS — R928 Other abnormal and inconclusive findings on diagnostic imaging of breast: Secondary | ICD-10-CM
